# Patient Record
Sex: Female | Born: 1955 | Race: White | Hispanic: No | Marital: Married | Smoking: Former smoker
Health system: Southern US, Community
[De-identification: ages and names within clinical notes are randomized; demographics above are authoritative.]

## PROBLEM LIST (undated history)

## (undated) DIAGNOSIS — E119 Type 2 diabetes mellitus without complications: Secondary | ICD-10-CM

## (undated) DIAGNOSIS — Z940 Kidney transplant status: Secondary | ICD-10-CM

## (undated) DIAGNOSIS — I10 Essential (primary) hypertension: Secondary | ICD-10-CM

## (undated) DIAGNOSIS — N183 Chronic kidney disease, stage 3 unspecified: Secondary | ICD-10-CM

## (undated) DIAGNOSIS — D509 Iron deficiency anemia, unspecified: Secondary | ICD-10-CM

## (undated) DIAGNOSIS — C689 Malignant neoplasm of urinary organ, unspecified: Secondary | ICD-10-CM

## (undated) DIAGNOSIS — Z8551 Personal history of malignant neoplasm of bladder: Secondary | ICD-10-CM

## (undated) DIAGNOSIS — Z9889 Other specified postprocedural states: Secondary | ICD-10-CM

## (undated) DIAGNOSIS — M810 Age-related osteoporosis without current pathological fracture: Secondary | ICD-10-CM

## (undated) DIAGNOSIS — E785 Hyperlipidemia, unspecified: Secondary | ICD-10-CM

## (undated) HISTORY — DX: Hyperlipidemia, unspecified: E78.5

## (undated) HISTORY — PX: COLONOSCOPY: SHX174

## (undated) HISTORY — PX: OTHER SURGICAL HISTORY: SHX169

## (undated) HISTORY — DX: Essential (primary) hypertension: I10

## (undated) HISTORY — DX: Personal history of malignant neoplasm of bladder: Z85.51

## (undated) HISTORY — DX: Type 2 diabetes mellitus without complications: E11.9

## (undated) HISTORY — DX: Kidney transplant status: Z94.0

---

## 2020-02-11 LAB — COLOGUARD: COLOGUARD: NEGATIVE

## 2020-07-25 ENCOUNTER — Other Ambulatory Visit: Payer: Self-pay

## 2020-07-25 ENCOUNTER — Encounter: Payer: Self-pay | Admitting: Internal Medicine

## 2020-07-25 ENCOUNTER — Ambulatory Visit (INDEPENDENT_AMBULATORY_CARE_PROVIDER_SITE_OTHER): Payer: Medicare Other | Admitting: Internal Medicine

## 2020-07-25 VITALS — BP 138/80 | HR 68 | Wt 114.0 lb

## 2020-07-25 DIAGNOSIS — M810 Age-related osteoporosis without current pathological fracture: Secondary | ICD-10-CM

## 2020-07-25 DIAGNOSIS — E559 Vitamin D deficiency, unspecified: Secondary | ICD-10-CM

## 2020-07-25 LAB — BASIC METABOLIC PANEL
BUN: 42 mg/dL — ABNORMAL HIGH (ref 6–23)
CO2: 23 mEq/L (ref 19–32)
Calcium: 9 mg/dL (ref 8.4–10.5)
Chloride: 107 mEq/L (ref 96–112)
Creatinine, Ser: 1.25 mg/dL — ABNORMAL HIGH (ref 0.40–1.20)
GFR: 45.36 mL/min — ABNORMAL LOW (ref >60.00)
Glucose, Bld: 82 mg/dL (ref 70–99)
Potassium: 4.6 mEq/L (ref 3.5–5.1)
Sodium: 138 mEq/L (ref 135–145)

## 2020-07-25 NOTE — Patient Instructions (Addendum)
We will try to get your DXA records from [place].  Please call and schedule bone density scan at the [facility]: [phone].   We will start the Prolia preauthorization after results of the DXA scan are back.  Please stop at the lab.  Please return in 1 year.  How Can I Prevent Falls? Men and women with osteoporosis need to take care not to fall down. Falls can break bones. Some reasons people fall are: Poor vision  Poor balance  Certain diseases that affect how you walk  Some types of medicine, such as sleeping pills.  Some tips to help prevent falls outdoors are: Use a cane or walker  Wear rubber-soled shoes so you don't slip  Walk on grass when sidewalks are slippery  In winter, put salt or kitty litter on icy sidewalks.  Some ways to help prevent falls indoors are: Keep rooms free of clutter, especially on floors  Use plastic or carpet runners on slippery floors  Wear low-heeled shoes that provide good support  Do not walk in socks, stockings, or slippers  Be sure carpets and area rugs have skid-proof backs or are tacked to the floor  Be sure stairs are well lit and have rails on both sides  Put grab bars on bathroom walls near tub, shower, and toilet  Use a rubber bath mat in the shower or tub  Keep a flashlight next to your bed  Use a sturdy step stool with a handrail and wide steps  Add more lights in rooms (and night lights) Buy a cordless phone to keep with you so that you don't have to rush to the phone       when it rings and so that you can call for help if you fall.   (adapted from http://www.niams.NightlifePreviews.se)  Please check out the following book about best diet for bone health:   Exercise for Strong Bones (from [place]) There are two types of exercises that are important for building and maintaining bone density:  weight-bearing and muscle-strengthening exercises. Weight-bearing  Exercises These exercises include activities that make you move against gravity while staying upright. Weight-bearing exercises can be high-impact or low-impact. High-impact weight-bearing exercises help build bones and keep them strong. If you have broken a bone due to osteoporosis or are at risk of breaking a bone, you may need to avoid high-impact exercises. If you're not sure, you should check with your healthcare provider. Examples of high-impact weight-bearing exercises are: Dancing Doing high-impact aerobics Hiking Jogging/running Jumping Rope Stair climbing Tennis Low-impact weight-bearing exercises can also help keep bones strong and are a safe alternative if you cannot do high-impact exercises. Examples of low-impact weight-bearing exercises are: Using elliptical training machines Doing low-impact aerobics Using stair-step machines Fast walking on a treadmill or outside Muscle-Strengthening Exercises These exercises include activities where you move your body, a weight or some other resistance against gravity. They are also known as resistance exercises and include: Lifting weights Using elastic exercise bands Using weight machines Lifting your own body weight Functional movements, such as standing and rising up on your toes Yoga and Pilates can also improve strength, balance and flexibility. However, certain positions may not be safe for people with osteoporosis or those at increased risk of broken bones. For example, exercises that have you bend forward may increase the chance of breaking a bone in the spine. A physical therapist should be able to help you learn which exercises are safe and appropriate for you.  Non-Impact Exercises Non-impact exercises can help you to improve balance, posture and how well you move in everyday activities. These exercises can also help to increase muscle strength and decrease the risk of falls and broken bones. Some of these exercises  include: Balance exercises that strengthen your legs and test your balance, such as Tai Chi, can decrease your risk of falls. Posture exercises that improve your posture and reduce rounded or "sloping" shoulders can help you decrease the chance of breaking a bone, especially in the spine. Functional exercises that improve how well you move can help you with everyday activities and decrease your chance of falling and breaking a bone. For example, if you have trouble getting up from a chair or climbing stairs, you should do these activities as exercises. A physical therapist can teach you balance, posture and functional exercises. Starting a New Exercise Program If you haven't exercised regularly for a while, check with your healthcare provider before beginning a new exercise program--particularly if you have health problems such as heart disease, diabetes or high blood pressure. If you're at high risk of breaking a bone, you should work with a physical therapist to develop a safe exercise program. Once you have your healthcare provider's approval, start slowly. If you've already broken bones in the spine because of osteoporosis, be very careful to avoid activities that require reaching down, bending forward, rapid twisting motions, heavy lifting and those that increase your chance of a fall. As you get started, your muscles may feel sore for a day or two after you exercise. If soreness lasts longer, you may be working too hard and need to ease up. Exercises should be done in a pain-free range of motion. How Much Exercise Do You Need? Weight-bearing exercises 30 minutes on most days of the week. Do a 30-minutesession or multiple sessions spread out throughout the day. The benefits to your bones are the same.   Muscle-strengthening exercises Two to three days per week. If you don't have much time for strengthening/resistance training, do small amounts at a time. You can do just one body part each day. For  example do arms one day, legs the next and trunk the next. You can also spread these exercises out during your normal day.  Balance, posture and functional exercises Every day or as often as needed. You may want to focus on one area more than the others. If you have fallen or lose your balance, spend time doing balance exercises. If you are getting rounded shoulders, work more on posture exercises. If you have trouble climbing stairs or getting up from the couch, do more functional exercises. You can also perform these exercises at one time or spread them during your day. Work with a phyiscal therapist to learn the right exercises for you.   Denosumab: Patient drug information (Up-to-date) Copyright [phone] [place] rights reserved.  Brand Names: [place]  Prolia[place] See What is this drug used for?  It is used to treat soft, brittle bones (osteoporosis).  It is used for bone growth.  It is used when treating some cancers.  It may be given to you for other reasons. Talk with the doctor. What do I need to tell my doctor BEFORE I take this drug?  All products:  If you have an allergy to denosumab or any other part of this drug.  If you are allergic to any drugs like this one, any other drugs, foods, or other substances. Tell your doctor about the  allergy and what signs you had, like rash; hives; itching; shortness of breath; wheezing; cough; swelling of face, lips, tongue, or throat; or any other signs.  If you have low calcium levels.  ProliaT:  If you are pregnant or may be pregnant. Do not take this drug if you are pregnant.  This is not a list of all drugs or health problems that interact with this drug.  Tell your doctor and pharmacist about all of your drugs (prescription or OTC, natural products, vitamins) and health problems. You must check to make sure that it is safe for you to take this drug with all of your drugs and health problems. Do not start, stop, or change the dose of any  drug without checking with your doctor. What are some things I need to know or do while I take this drug?  All products:  Tell dentists, surgeons, and other doctors that you use this drug.  This drug may raise the chance of a broken leg. Talk with your doctor.  Have your blood work checked. Talk with your doctor.  Have a bone density test. Talk with your doctor.  Take calcium and vitamin D as you were told by your doctor.  Have a dental exam before starting this drug.  Take good care of your teeth. See a dentist often.  If you smoke, talk with your doctor.  Do not give to a child. Talk with your doctor.  Tell your doctor if you are breast-feeding. You will need to talk about any risks to your baby.  [place] See:  This drug may cause harm to the unborn baby if you take it while you are pregnant. If you get pregnant while taking this drug, call your doctor right away.  ProliaT:  Very bad infections have been reported with use of this drug. If you have any infection, are taking antibiotics now or in the recent past, or have many infections, talk with your doctor.  You may have more chance of getting an infection. Wash hands often. Stay away from people with infections, colds, or flu.  Use birth control that you can trust to prevent pregnancy while taking this drug.  If you are a man and your sex partner is pregnant or gets pregnant at any time while you are being treated, talk with your doctor. What are some side effects that I need to call my doctor about right away?  WARNING/CAUTION: Even though it may be rare, some people may have very bad and sometimes deadly side effects when taking a drug. Tell your doctor or get medical help right away if you have any of the following signs or symptoms that may be related to a very bad side effect:  All products:  Signs of an allergic reaction, like rash; hives; itching; red, swollen, blistered, or peeling skin with or without fever; wheezing; tightness in the  chest or throat; trouble breathing or talking; unusual hoarseness; or swelling of the mouth, face, lips, tongue, or throat.  Signs of low calcium levels like muscle cramps or spasms, numbness and tingling, or seizures.  Mouth sores.  Any new or strange groin, hip, or thigh pain.  This drug may cause jawbone problems. The chance may be higher the longer you take this drug. The chance may be higher if you have cancer, dental problems, dentures that do not fit well, anemia, blood clotting problems, or an infection. The chance may also be higher if you are having dental work or if you  are getting chemo, some steroid drugs, or radiation. Call your doctor right away if you have jaw swelling or pain.  Xgeva:  Not hungry.  Muscle pain or weakness.  Seizures.  Shortness of breath.  ProliaT:  Signs of infection. These include a fever of 100.55F (38C) or higher, chills, very bad sore throat, ear or sinus pain, cough, more sputum or change in color of sputum, pain with passing urine, mouth sores, wound that will not heal, or anal itching or pain.  Signs of a pancreas problem (pancreatitis) like very bad stomach pain, very bad back pain, or very bad upset stomach or throwing up.  Chest pain.  A heartbeat that does not feel normal.  Very bad skin irritation.  Feeling very tired or weak.  Bladder pain or pain when passing urine or change in how much urine is passed.  Passing urine often.  Swelling in the arms or legs. What are some other side effects of this drug?  All drugs may cause side effects. However, many people have no side effects or only have minor side effects. Call your doctor or get medical help if any of these side effects or any other side effects bother you or do not go away:  Xgeva:  Feeling tired or weak.  Headache.  Upset stomach or throwing up.  Loose stools (diarrhea).  Cough.  ProliaT:  Back pain.  Muscle or joint pain.  Sore throat.  Runny nose.  Pain in arms or legs.   These are not all of the side effects that may occur. If you have questions about side effects, call your doctor. Call your doctor for medical advice about side effects.  You may report side effects to your national health agency. How is this drug best taken?  Use this drug as ordered by your doctor. Read and follow the dosing on the label closely.  It is given as a shot into the fatty part of the skin. What do I do if I miss a dose?  Call the doctor to find out what to do. How do I store and/or throw out this drug?  This drug will be given to you in a hospital or doctor's office. You will not store it at home.  Keep all drugs out of the reach of children and pets.  Check with your pharmacist about how to throw out unused drugs.  General drug facts  If your symptoms or health problems do not get better or if they become worse, call your doctor.  Do not share your drugs with others and do not take anyone else's drugs.  Keep a list of all your drugs (prescription, natural products, vitamins, OTC) with you. Give this list to your doctor.  Talk with the doctor before starting any new drug, including prescription or OTC, natural products, or vitamins.  Some drugs may have another patient information leaflet. If you have any questions about this drug, please talk with your doctor, pharmacist, or other health care provider.  If you think there has been an overdose, call your poison control center or get medical care right away. Be ready to tell or show what was taken, how much, and when it happened.

## 2020-07-25 NOTE — Progress Notes (Addendum)
Patient ID: Rhonda [name], female   DOB: 1955-09-26, 65 y.o.   MRN: [id number]  This visit occurred during the SARS-CoV-2 public health emergency.  Safety protocols were in place, including screening questions prior to the visit, additional usage of staff PPE, and extensive cleaning of exam room while observing appropriate contact time as indicated for disinfecting solutions.   HPI  Rhonda [name] is a 65 y.o.-year-old female, referred by her PCP, Dr.Dr. Baird Cancer, for management of osteoporosis (OP).  She moved from [place] to [place] and then to [place] recently.  Patient is seen by Dr. Baird Cancer by nephrology for her history of reflux end-stage renal disease, status post renal transplant in 2001.  She is on prednisone 4 mg daily, Prograf, Tacrolimus.  Pt was dx with OP in 2019 at the time of her last bone density scan. Pt's DXA scans are not available, but will be requested today.  She denies fractures.  No falls.  No dizziness/vertigo/orthostasis/poor vision.   Previous OP treatments:  - Fosamax off and on for few years - after 2020 - per nephrologist in [place] ([place], [place]) - Cr increased to 1.5 >> stopped, but retrospectively, this could have been 2/2 Covid vaccine  She has a history of vitamin D insufficiency.  Reviewed available vit D levels: 12/18/2019: Vitamin D 27.9 - on MVI No results found for: VD25OH  Pt is on Centrum for women containing both calcium and vitamin D.  She is on a vegetarian diet.  Drinks almond milk.  She does some weight bearing exercises and also walks daily for more than 2 miles in the park near her [place].  Feels chest burning with walking uphill.  She does not take high vitamin A doses.  No high dose steroid use.  Menopause was at 43-51 y/o.   FH of osteoporosis: mother, possibly 2 sisters (kyphosis).  No h/o hyper/hypocalcemia or hyperparathyroidism. No h/o kidney stones. 12/18/2019: Calcium 9.1  (8.7-10.3) No results found for: PTH, CALCIUM  No h/o thyrotoxicosis. No TSH recent levels:  No results found for: TSH  She has CKD stage IIIa. Last BUN/Cr: 06/04/2020: Cr 1.42 12/18/2019: BUN/creatinine 41/1.38, GFR 40. No results found for: BUN, CREATININE  12/18/2019: Alkaline phosphatase was normal, at 75 (44-121).  She also has a history of bladder cancer - had intravesical chemotherapy, no radiation therapy; diverticulitis; HTN, HL.  ROS: + See HPI Constitutional: no weight gain, no weight loss, no fatigue, no subjective hyperthermia, no subjective hypothermia, + nocturia Eyes: no blurry vision, no xerophthalmia ENT: no sore throat, no nodules palpated in neck, no dysphagia, no odynophagia, no hoarseness, no tinnitus, no hypoacusis Cardiovascular: + shortness of breath, no palpitations, no leg swelling Respiratory: no cough, no wheezing, + shortness of breath Gastrointestinal: no N, no V, no D, no C, no acid reflux Musculoskeletal: no muscle, + joint aches Skin: no rash, no hair loss Neurological: no tremors, no numbness or tingling/no dizziness/no HAs Psychiatric: no depression, no anxiety  I reviewed pt's medications, allergies, PMH, social hx, family hx, and changes were documented in the history of present illness. Otherwise, unchanged from my initial visit note.  No past medical history on file.  Social History   Socioeconomic History   Marital status: Married    Spouse name: Not on file   Number of children: 1   Years of education: Not on file   Highest education level: Not on file  Occupational History   Not on file  Tobacco Use  Smoking status: Former    Pack years: 0.00    Types: Cigarettes    Quit date: 02/17/1983    Years since quitting: 37.4   Smokeless tobacco: Never  Substance and Sexual Activity   Alcohol use: Not on file   Drug use: Not on file   Sexual activity: Not on file  Other Topics Concern   Not on file  Social History Narrative   Not on  file   Social Determinants of Health   Financial Resource Strain: Not on file  Food Insecurity: Not on file  Transportation Needs: Not on file  Physical Activity: Not on file  Stress: Not on file  Social Connections: Not on file  Intimate Partner Violence: Not on file   Current Outpatient Medications on File Prior to Visit  Medication Sig Dispense Refill   lisinopril (ZESTRIL) 5 MG tablet TAKE 1 TABLET(5 MG) BY MOUTH DAILY     lovastatin (MEVACOR) 20 MG tablet TAKE 1 TABLET(20 MG) BY MOUTH AT BEDTIME     predniSONE (DELTASONE) 1 MG tablet TAKE 4 TABLETS(4 MG) BY MOUTH DAILY     tacrolimus (PROGRAF) 1 MG capsule TAKE 3 CAPSULES BY MOUTH EVERY 12 HOURS     No current facility-administered medications on file prior to visit.   Allergies  Allergen Reactions   Penicillins Rash   Sulfa Antibiotics Rash   Family History  Problem Relation Age of Onset   Heart disease Mother    Cancer Father    Diabetes Father    Heart disease Sister     PE: BP 138/80 (BP Location: Right Arm, Patient Position: Sitting, Cuff Size: Normal)   Pulse 68   Wt 114 lb (51.7 kg)   SpO2 98%  Wt Readings from Last 3 Encounters:  07/25/20 114 lb (51.7 kg)   Constitutional: Thin, in NAD. No kyphosis. Eyes: PERRLA, EOMI, no exophthalmos ENT: moist mucous membranes, no thyromegaly, no cervical lymphadenopathy Cardiovascular: RRR, No MRG Respiratory: CTA B Gastrointestinal: abdomen soft, NT, ND, BS+ Musculoskeletal: no deformities, strength intact in all 4 Skin: moist, warm, no rashes Neurological: no tremor with outstretched hands, DTR normal in all 4  Assessment: 1. Osteoporosis  2.  Vitamin D insufficiency  Plan: 1. Osteoporosis - likely postmenopausal/age-related + also posttransplant + genetic - she has FH of OP - Discussed about increased risk of fracture, depending on the T score, greatly increased when the T score is lower than -2.5, but it is actually a continuum and -2.5 should not be  regarded as an absolute threshold.  With T-scores in the osteoporotic range reportedly, she has an increased risk for fractures.  At today's visit, she signed a release of information form and we will send this to Dr. Cheri Kearns, her previous endocrinologist, who has patient's DXA scan report.  Since it has been 3 years from the previous scan, we will obtain a new bone density now. - we reviewed her dietary and supplemental calcium and vitamin D intake. I recommended to make sure she gets 1000-1200 mg of calcium daily preferentially from diet and I will check vit D today to see if she needs further supplementation beyond the multivitamin. - discussed fall precautions   - given handout from [facility]: weight bearing exercises - advised to do this every day or at least 5/7 days - I also recommended the [facility] - for skeletal loading. Given brochure and explained benefits. - we discussed about maintaining a good amount of protein in her  diet. The recommended daily protein intake is ~0.8 g per kilogram per day for her, approximately 40 g). Also, avoid smoking or >2 drinks of alcohol a day. - I recommended the following book for the concept of low acid eating and explained why this is important:  - We discussed about the different medication classes, benefits and side effects (including atypical fractures and ONJ - no dental workup in progress or planned).  - I explained that first options are sq denosumab (Prolia) sq every 6 months since this is not eliminated renally, and we can continue this for as much as 10 years and probably 1 further.  Due to her chronic kidney disease and previous increase in creatinine after Fosamax bisphosphonates are not first-line for her.  If absolutely necessary, we can try zoledronic acid (Reclast) iv - but with slow infusion rate, 1x a year for 1-2 years. I would use Teriparatide/Abaloparatide (which are daily subcu medication) or  Romosozumab (monthly) as a last resort.  - Pt was given reading information about Prolia, and I explained the mechanism of action and expected benefits.  - Will check the following: Orders Placed This Encounter  Procedures   DG Bone Density   VITAMIN D 25 Hydroxy (Vit-D Deficiency, Fractures)   Basic metabolic panel  - if labs normal, will arrange for a Prolia inj - will check a new DXA scan in 2 years after starting Prolia -  I explained that the first indication that the treatment is working is her not having anymore fractures. DXA scan changes are secondary: unchanged or slightly higher T-scores are desirable - will see pt back in a year  2.  Vitamin D insufficiency -on a MVI -We will check a vitamin D level today -Discussed that we may need to add back a separate vitamin D supplement  After the lab and DXA results are back, we need to send a preauthorization for Prolia to her insurance.  Component     Latest Ref Rng & Units 07/25/2020  Sodium     135 - 145 mEq/L 138  Potassium     3.5 - 5.1 mEq/L 4.6  Chloride     96 - 112 mEq/L 107  CO2     19 - 32 mEq/L 23  Glucose     70 - 99 mg/dL 82  BUN     6 - 23 mg/dL 42 (H)  Creatinine     0.40 - 1.20 mg/dL 1.25 (H)  GFR     >60.00 mL/min 45.36 (L)  Calcium     8.4 - 10.5 mg/dL 9.0  Vitamin D, 25-Hydroxy     30.0 - 100.0 ng/mL 23.5 (L)  Vitamin D is low.  I would suggest to start 2000 units vitamin D daily and we will recheck her level in 2-3 months.   07/30/2020 ([facility]) Lumbar spine L1-L4 Femoral neck (FN)  T-score   -1.6 RFN: -3.5 LFN: -3.2   Osteoporosis  - will start the application for Prolia.  Philemon Kingdom, MD PhD [facility]

## 2020-07-26 ENCOUNTER — Encounter: Payer: Self-pay | Admitting: Internal Medicine

## 2020-07-26 ENCOUNTER — Ambulatory Visit: Payer: Medicare Other | Admitting: Interventional Cardiology

## 2020-07-26 ENCOUNTER — Telehealth: Payer: Self-pay

## 2020-07-26 LAB — VITAMIN D 25 HYDROXY (VIT D DEFICIENCY, FRACTURES): Vit D, 25-Hydroxy: 23.5 ng/mL — ABNORMAL LOW (ref 30.0–100.0)

## 2020-07-26 NOTE — Telephone Encounter (Addendum)
Requested pt call back and schedule lab appt. ----- Message from Philemon Kingdom, MD sent at 07/26/2020  8:17 AM EDT ----- Can you please call pt.:  Vitamin D is low.  I would suggest to start 2000 units vitamin D3 daily (OTC) and we will recheck her level in 2-3 months. Lab is in.

## 2020-07-30 ENCOUNTER — Ambulatory Visit (INDEPENDENT_AMBULATORY_CARE_PROVIDER_SITE_OTHER)
Admission: RE | Admit: 2020-07-30 | Discharge: 2020-07-30 | Disposition: A | Discharge status: Home / Self Care | Payer: Medicare Other | Source: Ambulatory Visit | Attending: Internal Medicine | Admitting: Internal Medicine

## 2020-07-30 ENCOUNTER — Other Ambulatory Visit: Payer: Self-pay

## 2020-07-30 DIAGNOSIS — M810 Age-related osteoporosis without current pathological fracture: Secondary | ICD-10-CM | POA: Diagnosis not present

## 2020-08-02 DIAGNOSIS — M810 Age-related osteoporosis without current pathological fracture: Secondary | ICD-10-CM | POA: Diagnosis not present

## 2020-08-05 ENCOUNTER — Telehealth: Payer: Self-pay

## 2020-08-05 NOTE — Telephone Encounter (Signed)
Prolia VOB initiated via [email or web address] 

## 2020-08-06 ENCOUNTER — Telehealth: Payer: Self-pay

## 2020-08-06 NOTE — Telephone Encounter (Addendum)
-----   Message from Philemon Kingdom, MD sent at 08/05/2020  7:00 PM EDT ----- Can you please call pt.:  I received the result of her recent DXA scan: this shows osteoporosis, with the lowest T-score at the hips. We will start the process to obtain Prolia - will submit the application to the insurance and will contact her as soon as it is approved. Just in case, please let us know if you are not contacted inthe next 2 weeks. Ty! C  Left VM requesting a call back to discuss results.

## 2020-08-07 NOTE — Telephone Encounter (Signed)
Pt calling in to follow up on last message.

## 2020-08-08 NOTE — Telephone Encounter (Signed)
Pt ready for scheduling on or after 08/08/20  Out-of-pocket cost due at time of visit: $255  Primary: [employer] Medicare Prolia co-insurance: 20% ($255) Admin fee co-insurance: 0%  Secondary: n/a Prolia co-insurance:  Admin fee co-insurance:   Deductible: does not apply  Prior Auth: not required PA# Valid:

## 2020-08-09 ENCOUNTER — Encounter: Payer: Self-pay | Admitting: Internal Medicine

## 2020-08-10 ENCOUNTER — Telehealth: Payer: Self-pay

## 2020-08-10 NOTE — Progress Notes (Signed)
Spoke with pt regarding lab results. 

## 2020-08-16 NOTE — Telephone Encounter (Signed)
Called to schedule - patient asked if she could call back to schedule.

## 2020-08-27 ENCOUNTER — Encounter: Payer: Self-pay | Admitting: Internal Medicine

## 2020-08-28 ENCOUNTER — Other Ambulatory Visit: Payer: Self-pay | Admitting: Internal Medicine

## 2020-08-28 MED ORDER — ALENDRONATE SODIUM 70 MG PO TABS: 70.0000 mg | ORAL_TABLET | ORAL | 3 refills | Status: DC

## 2020-08-29 NOTE — Telephone Encounter (Signed)
Pt overdue for Prolia injection. Please follow up with pt to schedule.   Thank you!

## 2020-09-18 NOTE — Telephone Encounter (Signed)
Called and spoke with patient she has decided to stay with Fosamax.  She did not want to schedule Prolia injection

## 2020-09-19 NOTE — Telephone Encounter (Signed)
Pt. Discontinued in Prolia Portal.

## 2020-09-23 NOTE — Progress Notes (Signed)
Cardiology Office Note:    Date:  09/25/2020   ID:  Rhonda [name], DOB Mar 08, 1955, MRN [id number]  PCP:  Jolinda Croak, MD  Cardiologist:  None   Referring MD: Rexene Agent, MD   Chief Complaint  Patient presents with   Shortness of Breath     History of Present Illness:    Rhonda [name] is a 65 y.o. female with a hx of exertional dyspnea.  Background history includes hypertension, end-stage kidney disease requiring kidney transplantation 2001, prediabetes, and hyperlipidemia.  Noticing exertional dyspnea when walking up inclines.  This is relatively new.  She walks 45 minutes 3-4 times per week.  She feels this complaint is getting somewhat better since she spoke to Dr. Joelyn Oms about it in June.  She denies associated chest discomfort.  There is no orthopnea or PND.  He does not smoke.    Past Medical History:  Diagnosis Date   Diabetes mellitus without complication ([place])    Hyperlipidemia    Hypertension    Kidney transplant recipient     Past Surgical History:  Procedure Laterality Date   Kidney transplant 2001 N/A     Current Medications: Current Meds  Medication Sig   alendronate (FOSAMAX) 70 MG tablet Take 1 tablet (70 mg total) by mouth every 7 (seven) days. Take with a full glass of water on an empty stomach.   b complex vitamins capsule Take 1 capsule by mouth daily.   docusate sodium (COLACE) 100 MG capsule Take 100 mg by mouth daily.   lisinopril (ZESTRIL) 5 MG tablet TAKE 1 TABLET(5 MG) BY MOUTH DAILY   lovastatin (MEVACOR) 20 MG tablet TAKE 1 TABLET(20 MG) BY MOUTH AT BEDTIME   predniSONE (DELTASONE) 1 MG tablet TAKE 4 TABLETS(4 MG) BY MOUTH DAILY   tacrolimus (PROGRAF) 1 MG capsule TAKE 3 CAPSULES BY MOUTH EVERY 12 HOURS     Allergies:   Penicillins and Sulfa antibiotics   Social History   Socioeconomic History   Marital status: Married    Spouse name: Not on file   Number of children: 1   Years of education: Not on file    Highest education level: Not on file  Occupational History   Not on file  Tobacco Use   Smoking status: Former    Types: Cigarettes    Quit date: 02/17/1983    Years since quitting: 37.6   Smokeless tobacco: Never  Substance and Sexual Activity   Alcohol use: Not on file   Drug use: Not on file   Sexual activity: Not on file  Other Topics Concern   Not on file  Social History Narrative   Not on file   Social Determinants of Health   Financial Resource Strain: Not on file  Food Insecurity: Not on file  Transportation Needs: Not on file  Physical Activity: Not on file  Stress: Not on file  Social Connections: Not on file     Family History: The patient's family history includes Cancer in her father; Diabetes in her father; Heart disease in her mother and sister.  ROS:   Please see the history of present illness.    No difficulty sleeping.  Sister had PAD.  She has no claudication.  Never had a stroke.  All other systems reviewed and are negative.  EKGs/Labs/Other Studies Reviewed:    The following studies were reviewed today: No functional testing  EKG:  EKG sinus bradycardia, normal PR interval, overall normal appearance.  Recent Labs:  07/25/2020: BUN 42; Creatinine, Ser 1.25; Potassium 4.6; Sodium 138  Recent Lipid Panel No results found for: CHOL, TRIG, HDL, CHOLHDL, VLDL, LDLCALC, LDLDIRECT  Physical Exam:    VS:  BP 102/68   Pulse (!) 58   Ht '5\' 3"'$  (1.6 m)   Wt 111 lb 3.2 oz (50.4 kg)   SpO2 98%   BMI 19.70 kg/m     Wt Readings from Last 3 Encounters:  09/25/20 111 lb 3.2 oz (50.4 kg)  07/25/20 114 lb (51.7 kg)     GEN: Slender was somewhat pale skin. No acute distress HEENT: Normal NECK: No JVD. LYMPHATICS: No lymphadenopathy CARDIAC: No murmur. RRR no gallop, or edema. VASCULAR:  Normal Pulses. No bruits. RESPIRATORY:  Clear to auscultation without rales, wheezing or rhonchi  ABDOMEN: Soft, non-tender, non-distended, No pulsatile  mass, MUSCULOSKELETAL: No deformity  SKIN: Warm and dry NEUROLOGIC:  Alert and oriented x 3 PSYCHIATRIC:  Normal affect   ASSESSMENT:    1. DOE (dyspnea on exertion)   2. Hyperlipidemia LDL goal <70   3. End stage kidney disease ([place])   4. Kidney transplanted   5. Primary hypertension   6. Prediabetes    PLAN:    In order of problems listed above:  Possibly deconditioning.  Rule out diastolic dysfunction.  Rule out coronary disease.  A stress Myoview study will be done to exclude ischemic equivalent given her increased risk of vascular complications related to end-stage kidney disease, hyperlipidemia, and prediabetes. Most recent LDL 111.  If possible I would recommend further intensifying statin therapy to get LDL less than 70. Being followed closely and still has functioning pediatric kidney transplant from [id number] without complications.  Systolic Systolic blood pressure is slightly higher than we would like.  She will have a stress test done and we can gauge blood pressure response to exercise and determine if that has anything to do with exertional dyspnea. We discussed prediabetes and the risk imposed for possible vascular events.    After discussion, we have decided to do a myocardial perfusion study with exercise related stress to establish a baseline for follow-up.  If there is no evidence of ischemia/low risk, I would recommend aggressive primary risk factor modification to avoid vascular events downstream.   Overall education and awareness concerning primary/secondary risk prevention was discussed in detail: LDL less than 70, hemoglobin A1c less than 7, blood pressure target less than 130/80 mmHg, >150 minutes of moderate aerobic activity per week, avoidance of smoking, weight control (via diet and exercise), and continued surveillance/management of/for obstructive sleep apnea.    Medication Adjustments/Labs and Tests Ordered: Current medicines are reviewed at length with  the patient today.  Concerns regarding medicines are outlined above.  Orders Placed This Encounter  Procedures   MYOCARDIAL PERFUSION IMAGING   EKG 12-Lead    No orders of the defined types were placed in this encounter.   Patient Instructions  Medication Instructions:  Your physician recommends that you continue on your current medications as directed. Please refer to the Current Medication list given to you today.  *If you need a refill on your cardiac medications before your next appointment, please call your pharmacy*   Lab Work: None If you have labs (blood work) drawn today and your tests are completely normal, you will receive your results only by: [place] (if you have MyChart) OR A paper copy in the mail If you have any lab test that is abnormal or we need to change your treatment,  we will call you to review the results.   Testing/Procedures: Your physician has requested that you have an exercise stress myoview. For further information please visit [email or web address]. Please follow instruction sheet, as given.   Follow-Up: At [facility], you and your health needs are our priority.  As part of our continuing mission to provide you with exceptional heart care, we have created designated Provider Care Teams.  These Care Teams include your primary Cardiologist (physician) and Advanced Practice Providers (APPs -  Physician Assistants and Nurse Practitioners) who all work together to provide you with the care you need, when you need it.  We recommend signing up for the patient portal called "MyChart".  Sign up information is provided on this After Visit Summary.  MyChart is used to connect with patients for Virtual Visits (Telemedicine).  Patients are able to view lab/test results, encounter notes, upcoming appointments, etc.  Non-urgent messages can be sent to your provider as well.   To learn more about what you can do with MyChart, go to [email or web address].     Your next appointment:   As needed  The format for your next appointment:   In Person  Provider:   You may see Daneen Schick, MD or one of the following Advanced Practice Providers on your designated Care Team:   Cecilie Kicks, NP    Other Instructions     Signed, Sinclair Grooms, MD  09/25/2020 9:59 AM    [place]

## 2020-09-25 ENCOUNTER — Other Ambulatory Visit: Payer: Self-pay

## 2020-09-25 ENCOUNTER — Encounter: Payer: Self-pay | Admitting: *Deleted

## 2020-09-25 ENCOUNTER — Ambulatory Visit: Payer: Medicare Other | Admitting: Interventional Cardiology

## 2020-09-25 ENCOUNTER — Encounter: Payer: Self-pay | Admitting: Interventional Cardiology

## 2020-09-25 VITALS — BP 102/68 | HR 58 | Ht 63.0 in | Wt 111.2 lb

## 2020-09-25 DIAGNOSIS — R0600 Dyspnea, unspecified: Secondary | ICD-10-CM | POA: Diagnosis not present

## 2020-09-25 DIAGNOSIS — E785 Hyperlipidemia, unspecified: Secondary | ICD-10-CM

## 2020-09-25 DIAGNOSIS — I10 Essential (primary) hypertension: Secondary | ICD-10-CM

## 2020-09-25 DIAGNOSIS — R7303 Prediabetes: Secondary | ICD-10-CM

## 2020-09-25 DIAGNOSIS — N186 End stage renal disease: Secondary | ICD-10-CM | POA: Diagnosis not present

## 2020-09-25 DIAGNOSIS — Z940 Kidney transplant status: Secondary | ICD-10-CM

## 2020-09-25 DIAGNOSIS — R0609 Other forms of dyspnea: Secondary | ICD-10-CM

## 2020-09-25 NOTE — Patient Instructions (Signed)
Medication Instructions:  Your physician recommends that you continue on your current medications as directed. Please refer to the Current Medication list given to you today.  *If you need a refill on your cardiac medications before your next appointment, please call your pharmacy*   Lab Work: None If you have labs (blood work) drawn today and your tests are completely normal, you will receive your results only by: [place] (if you have MyChart) OR A paper copy in the mail If you have any lab test that is abnormal or we need to change your treatment, we will call you to review the results.   Testing/Procedures: Your physician has requested that you have an exercise stress myoview. For further information please visit [email or web address]. Please follow instruction sheet, as given.   Follow-Up: At [facility], you and your health needs are our priority.  As part of our continuing mission to provide you with exceptional heart care, we have created designated Provider Care Teams.  These Care Teams include your primary Cardiologist (physician) and Advanced Practice Providers (APPs -  Physician Assistants and Nurse Practitioners) who all work together to provide you with the care you need, when you need it.  We recommend signing up for the patient portal called "MyChart".  Sign up information is provided on this After Visit Summary.  MyChart is used to connect with patients for Virtual Visits (Telemedicine).  Patients are able to view lab/test results, encounter notes, upcoming appointments, etc.  Non-urgent messages can be sent to your provider as well.   To learn more about what you can do with MyChart, go to [email or web address].    Your next appointment:   As needed  The format for your next appointment:   In Person  Provider:   You may see Daneen Schick, MD or one of the following Advanced Practice Providers on your designated Care Team:   Cecilie Kicks, NP    Other  Instructions

## 2020-09-27 ENCOUNTER — Telehealth (HOSPITAL_COMMUNITY): Payer: Self-pay | Admitting: *Deleted

## 2020-09-27 NOTE — Telephone Encounter (Signed)
Patient given detailed instructions per Myocardial Perfusion Study Information Sheet for the test on 10/04/20 at 7:45. Patient notified to arrive 15 minutes early and that it is imperative to arrive on time for appointment to keep from having the test rescheduled.  If you need to cancel or reschedule your appointment, please call the office within 24 hours of your appointment. . Patient verbalized understanding.Rhonda [name]

## 2020-10-04 ENCOUNTER — Other Ambulatory Visit: Payer: Self-pay

## 2020-10-04 ENCOUNTER — Ambulatory Visit (HOSPITAL_COMMUNITY): Payer: Medicare Other | Attending: Cardiovascular Disease

## 2020-10-04 VITALS — Ht 63.0 in | Wt 111.0 lb

## 2020-10-04 DIAGNOSIS — R110 Nausea: Secondary | ICD-10-CM | POA: Diagnosis present

## 2020-10-04 DIAGNOSIS — R0600 Dyspnea, unspecified: Secondary | ICD-10-CM | POA: Diagnosis not present

## 2020-10-04 DIAGNOSIS — R0609 Other forms of dyspnea: Secondary | ICD-10-CM

## 2020-10-04 LAB — MYOCARDIAL PERFUSION IMAGING
LV dias vol: 44 mL (ref 46–106)
LV sys vol: 10 mL
Peak HR: 126 {beats}/min
Rest HR: 67 {beats}/min
SDS: 6
SRS: 1
SSS: 7
TID: 0.97

## 2020-10-04 MED ORDER — REGADENOSON 0.4 MG/5ML IV SOLN
0.4000 mg | Freq: Once | INTRAVENOUS | Status: AC
  Administered 2020-10-04: 0.4 mg via INTRAVENOUS

## 2020-10-04 MED ORDER — AMINOPHYLLINE 25 MG/ML IV SOLN
75.0000 mg | Freq: Once | INTRAVENOUS | Status: AC
  Administered 2020-10-04: 75 mg via INTRAVENOUS

## 2020-10-04 MED ORDER — TECHNETIUM TC 99M TETROFOSMIN IV KIT
10.2000 | PACK | Freq: Once | INTRAVENOUS | Status: AC | PRN
  Administered 2020-10-04: 10.2 via INTRAVENOUS
  Filled 2020-10-04: qty 11

## 2020-10-04 MED ORDER — TECHNETIUM TC 99M TETROFOSMIN IV KIT
30.7000 | PACK | Freq: Once | INTRAVENOUS | Status: AC | PRN
  Administered 2020-10-04: 30.7 via INTRAVENOUS
  Filled 2020-10-04: qty 31

## 2020-10-11 ENCOUNTER — Other Ambulatory Visit: Payer: Medicare Other

## 2020-10-11 ENCOUNTER — Other Ambulatory Visit (INDEPENDENT_AMBULATORY_CARE_PROVIDER_SITE_OTHER): Payer: Medicare Other

## 2020-10-11 ENCOUNTER — Other Ambulatory Visit: Payer: Self-pay

## 2020-10-11 DIAGNOSIS — E559 Vitamin D deficiency, unspecified: Secondary | ICD-10-CM

## 2020-10-11 DIAGNOSIS — M810 Age-related osteoporosis without current pathological fracture: Secondary | ICD-10-CM | POA: Diagnosis not present

## 2020-10-12 LAB — VITAMIN D 25 HYDROXY (VIT D DEFICIENCY, FRACTURES): Vit D, 25-Hydroxy: 43.7 ng/mL (ref 30.0–100.0)

## 2020-11-13 ENCOUNTER — Ambulatory Visit: Payer: Medicare Other | Admitting: Internal Medicine

## 2020-11-22 ENCOUNTER — Telehealth: Payer: Self-pay | Admitting: *Deleted

## 2020-11-22 ENCOUNTER — Other Ambulatory Visit: Payer: Self-pay | Admitting: *Deleted

## 2020-11-22 ENCOUNTER — Encounter: Payer: Self-pay | Admitting: *Deleted

## 2020-11-22 DIAGNOSIS — D649 Anemia, unspecified: Secondary | ICD-10-CM

## 2020-11-22 NOTE — Progress Notes (Signed)
Lab orders entered, on 11/1 and to see Dr Benay Spice on 11/3 at 1:40 pm

## 2020-11-22 NOTE — Progress Notes (Signed)
Patient scheduled to see Dr Benay Spice on 11/3 at 1:40 pm with labs on 11/1 Orders entered and scheduling message sent

## 2020-12-10 ENCOUNTER — Encounter: Payer: Self-pay | Admitting: Emergency Medicine

## 2020-12-10 ENCOUNTER — Other Ambulatory Visit: Payer: Self-pay

## 2020-12-10 ENCOUNTER — Telehealth: Payer: Self-pay | Admitting: Internal Medicine

## 2020-12-10 ENCOUNTER — Ambulatory Visit (INDEPENDENT_AMBULATORY_CARE_PROVIDER_SITE_OTHER): Payer: Medicare Other | Admitting: Emergency Medicine

## 2020-12-10 VITALS — BP 120/80 | HR 68 | Ht 63.0 in | Wt 112.0 lb

## 2020-12-10 DIAGNOSIS — Z8639 Personal history of other endocrine, nutritional and metabolic disease: Secondary | ICD-10-CM | POA: Diagnosis not present

## 2020-12-10 DIAGNOSIS — Z8679 Personal history of other diseases of the circulatory system: Secondary | ICD-10-CM

## 2020-12-10 DIAGNOSIS — Z8739 Personal history of other diseases of the musculoskeletal system and connective tissue: Secondary | ICD-10-CM

## 2020-12-10 DIAGNOSIS — K625 Hemorrhage of anus and rectum: Secondary | ICD-10-CM

## 2020-12-10 DIAGNOSIS — Z8551 Personal history of malignant neoplasm of bladder: Secondary | ICD-10-CM

## 2020-12-10 DIAGNOSIS — Z940 Kidney transplant status: Secondary | ICD-10-CM

## 2020-12-10 NOTE — Patient Instructions (Signed)
Lower Gastrointestinal Bleeding Lower gastrointestinal (GI) bleeding is the result of bleeding from the colon, the rectum, or the area of the opening of the buttocks (anal area). The colon is the last part of the digestive tract, where stool (feces) is formed. A person with lower GI bleeding may see blood in or on his or her stool. The blood may be bright red. Lower GI bleeding often stops without treatment. Continued or heavy bleeding may be life-threatening and needs emergency treatment at a hospital. What are the causes? This condition may be caused by: Diverticulosis. This condition causes pouches to form in your colon over time. Diverticulitis. This is inflammation in areas where diverticulosis has occurred. Inflammatory bowel disease (IBD), or inflammation of the colon. Hemorrhoids, or swollen veins in the rectum. Anal fissures, or painful tears in the anus. Tears are often caused by passing hard stools. Cancer of the colon or rectum, or polyps of the colon or rectum. Polyps are noncancerous growths. Coagulopathy. This is a disorder that makes it hard to form blood clots and causes easy bleeding. Arteriovenous malformation. This is an abnormal weakening of a blood vessel where an artery and a vein come together. What increases the risk? The following factors may make you more likely to develop this condition: Being older than age 66. Regular use of aspirin or NSAIDs, such as ibuprofen. Taking blood thinners (anticoagulants) or anti-platelet medicines. Having a history of high-dose X-ray treatment (radiation therapy) of the colon. Having recently had a colon polyp removed. Heavy use of alcohol, or use of nicotine products. What are the signs or symptoms? Symptoms of this condition include: Bright red blood or blood clots coming from your rectum. Bloody stools. Black or maroon-colored stools. Pain or cramping in your abdomen. Weakness or dizziness. Racing heartbeat. How is this  diagnosed? This condition may be diagnosed based on: Your symptoms and medical history. A physical exam. During the exam, your health care provider will check for signs of blood loss, such as low blood pressure and a fast pulse. Tests or procedures. These may include: Flexible sigmoidoscopy. In this procedure, a flexible tube with a camera on the end is used to examine your anus and the first part of your colon to look for the source of bleeding. Colonoscopy. This is similar to a flexible sigmoidoscopy, but the camera can extend all the way to the uppermost part of your colon. Blood tests to measure your red blood cell count and to check for coagulopathy. Angiogram. For this test, X-rays of your colon are taken after a dye or radioactive substance is injected into your bloodstream. This may be done to look for a bleeding site. How is this treated? Treatment for this condition depends on the cause of your bleeding. Heavy or ongoing bleeding is treated at a hospital. Treatment may include: Getting fluids through an IV inserted into one of your veins. Getting blood through an IV (blood transfusion). Stopping bleeding with high heat (coagulation), injections of certain medicines, or surgical clips. This can all be done during a colonoscopy. Having a procedure that involves first doing an angiogram and then blocking blood flow to the bleeding site (embolization). Stopping some of your regular medicines for a certain amount of time. Having surgery to remove part of your colon. This may be needed if bleeding is severe and does not lessen after other treatment. Follow these instructions at home: Eating and drinking  Eat foods that are high in fiber. This will help keep your stools soft.  These foods include whole grains, legumes, fruits, and vegetables. Eating 1-3 prunes a day works well for many people. Drink enough fluid to keep your urine pale yellow. Do not drink alcohol. General instructions  Do  not use any products that contain nicotine or tobacco, such as cigarettes, e-cigarettes, and chewing tobacco. If you need help quitting, ask your health care provider. Take over-the-counter and prescription medicines only as told by your health care provider. You may need to avoid aspirin, NSAIDs, or other medicines that increase bleeding. Return to your normal activities as told by your health care provider. Ask your health care provider what activities are safe for you. Keep all follow-up visits as told by your health care provider. This is important. Contact a health care provider if: Your symptoms do not improve. You feel nauseous, or you vomit. You have pain in your abdomen. You feel weak or dizzy. You need help to stop smoking or drinking alcohol. Get help right away if: Your bleeding increases. You have severe weakness or you faint. You have sudden or severe cramps in your back or abdomen. You vomit blood. You pass large blood clots in your stool. Any of your other symptoms get worse. These symptoms may represent a serious problem that is an emergency. Do not wait to see if the symptoms will go away. Get medical help right away. Call your local emergency services (911 in the [place] Do not drive yourself to the hospital. Summary If you have lower gastrointestinal (GI) bleeding, you may see blood in or on your stool (feces). The blood may be bright red. Take over-the-counter and prescription medicines only as told by your health care provider. You may need to avoid aspirin, NSAIDs, or other medicines that increase bleeding. Keep all follow-up visits as told by your health care provider. This is important. Get help right away if your symptoms get worse. This information is not intended to replace advice given to you by your health care provider. Make sure you discuss any questions you have with your health care provider. Document Revised: 01/28/2019 Document Reviewed: 01/28/2019 Elsevier  Patient Education  2022 [employer].

## 2020-12-10 NOTE — Progress Notes (Signed)
Rhonda [name] 65 y.o.   Chief Complaint  Patient presents with   Blood In Stools    Pt states it comes on and off    HISTORY OF PRESENT ILLNESS: This is a 65 y.o. female first visit to this office with an acute problem. Complaining of intermittent blood in the stools for the past several weeks. Denies abdominal pain.  Denies nausea or vomiting.  Denies syncope or feeling lightheaded. No associated symptoms. Patient has history of diabetes, hypertension, and dyslipidemia.  Kidney transplant patient.  History of bladder cancer. Sees hematologist oncologist on a regular basis as well as kidney doctor. States that she gets blood work regularly.  Does not want blood work done. States she also has history of diverticulosis and IBS.  Last colonoscopy done about 20 years ago. Cologuard test negative in 2021. Also has history of osteoporosis.  Sees endocrinologist on a regular basis.   HPI   Prior to Admission medications   Medication Sig Start Date End Date Taking? Authorizing Provider  alendronate (FOSAMAX) 70 MG tablet Take 1 tablet (70 mg total) by mouth every 7 (seven) days. Take with a full glass of water on an empty stomach. 08/28/20  Yes Philemon Kingdom, MD  b complex vitamins capsule Take 1 capsule by mouth daily.   Yes [provider]  docusate sodium (COLACE) 100 MG capsule Take 100 mg by mouth daily. 07/12/20  Yes [provider]  lisinopril (ZESTRIL) 5 MG tablet TAKE 1 TABLET(5 MG) BY MOUTH DAILY 06/03/20  Yes [provider]  lovastatin (MEVACOR) 20 MG tablet TAKE 1 TABLET(20 MG) BY MOUTH AT BEDTIME 06/03/20  Yes [provider]  predniSONE (DELTASONE) 1 MG tablet TAKE 4 TABLETS(4 MG) BY MOUTH DAILY 06/03/20  Yes [provider]  tacrolimus (PROGRAF) 1 MG capsule TAKE 3 CAPSULES BY MOUTH EVERY 12 HOURS 03/11/20  Yes [provider]    Allergies  Allergen Reactions   Penicillins Rash   Sulfa Antibiotics Rash     There are no problems to display for this patient.   Past Medical History:  Diagnosis Date   Diabetes mellitus without complication ([place])    Hyperlipidemia    Hypertension    Kidney transplant recipient     Past Surgical History:  Procedure Laterality Date   Kidney transplant 2001 N/A     Social History   Socioeconomic History   Marital status: Married    Spouse name: Not on file   Number of children: 1   Years of education: Not on file   Highest education level: Not on file  Occupational History   Not on file  Tobacco Use   Smoking status: Former    Types: Cigarettes    Quit date: 02/17/1983    Years since quitting: 37.8   Smokeless tobacco: Never  Substance and Sexual Activity   Alcohol use: Not on file   Drug use: Not on file   Sexual activity: Not on file  Other Topics Concern   Not on file  Social History Narrative   Not on file   Social Determinants of Health   Financial Resource Strain: Not on file  Food Insecurity: Not on file  Transportation Needs: Not on file  Physical Activity: Not on file  Stress: Not on file  Social Connections: Not on file  Intimate Partner Violence: Not on file    Family History  Problem Relation Age of Onset   Heart disease Mother    Cancer Father  Diabetes Father    Heart disease Sister      Review of Systems  Constitutional: Negative.  Negative for chills and fever.  HENT: Negative.  Negative for congestion and sore throat.   Respiratory: Negative.  Negative for cough and shortness of breath.   Cardiovascular: Negative.  Negative for chest pain and palpitations.  Gastrointestinal:  Positive for blood in stool. Negative for abdominal pain, diarrhea, melena, nausea and vomiting.  Genitourinary: Negative.   Skin: Negative.  Negative for rash.  Neurological: Negative.  Negative for dizziness and headaches.  All other systems reviewed and are negative.  Today's Vitals   12/10/20 1112  BP: 120/80  Pulse: 68   SpO2: 96%  Weight: 112 lb (50.8 kg)  Height: 5\' 3"  (1.6 m)   Body mass index is 19.84 kg/m.  Physical Exam Vitals reviewed.  Constitutional:      Appearance: Normal appearance.  HENT:     Head: Normocephalic.  Eyes:     Extraocular Movements: Extraocular movements intact.     Conjunctiva/sclera: Conjunctivae normal.     Pupils: Pupils are equal, round, and reactive to light.  Cardiovascular:     Rate and Rhythm: Normal rate and regular rhythm.     Pulses: Normal pulses.     Heart sounds: Normal heart sounds.  Pulmonary:     Effort: Pulmonary effort is normal.     Breath sounds: Normal breath sounds.  Abdominal:     General: There is no distension.     Palpations: Abdomen is soft.     Tenderness: There is no abdominal tenderness.     Comments: Refuses rectal exam.  Musculoskeletal:        General: Normal range of motion.     Cervical back: Normal range of motion.  Skin:    General: Skin is warm and dry.     Capillary Refill: Capillary refill takes less than 2 seconds.  Neurological:     General: No focal deficit present.     Mental Status: She is alert and oriented to person, place, and time.  Psychiatric:        Mood and Affect: Mood normal.        Behavior: Behavior normal.     ASSESSMENT & PLAN: Clinically stable.  Stable vital signs.  Benign abdominal examination. No red flag signs or symptoms.  Needs GI referral for diagnostic colonoscopy. [name] was seen today for blood in stools.  Diagnoses and all orders for this visit:  Rectal bleeding -     Ambulatory referral to Gastroenterology  History of hypertension  History of high cholesterol  History of osteoporosis  History of kidney transplant  History of bladder cancer  Patient Instructions  Lower Gastrointestinal Bleeding Lower gastrointestinal (GI) bleeding is the result of bleeding from the colon, the rectum, or the area of the opening of the buttocks (anal area). The colon is the last part of  the digestive tract, where stool (feces) is formed. A person with lower GI bleeding may see blood in or on his or her stool. The blood may be bright red. Lower GI bleeding often stops without treatment. Continued or heavy bleeding may be life-threatening and needs emergency treatment at a hospital. What are the causes? This condition may be caused by: Diverticulosis. This condition causes pouches to form in your colon over time. Diverticulitis. This is inflammation in areas where diverticulosis has occurred. Inflammatory bowel disease (IBD), or inflammation of the colon. Hemorrhoids, or swollen veins in the rectum.  Anal fissures, or painful tears in the anus. Tears are often caused by passing hard stools. Cancer of the colon or rectum, or polyps of the colon or rectum. Polyps are noncancerous growths. Coagulopathy. This is a disorder that makes it hard to form blood clots and causes easy bleeding. Arteriovenous malformation. This is an abnormal weakening of a blood vessel where an artery and a vein come together. What increases the risk? The following factors may make you more likely to develop this condition: Being older than age 58. Regular use of aspirin or NSAIDs, such as ibuprofen. Taking blood thinners (anticoagulants) or anti-platelet medicines. Having a history of high-dose X-ray treatment (radiation therapy) of the colon. Having recently had a colon polyp removed. Heavy use of alcohol, or use of nicotine products. What are the signs or symptoms? Symptoms of this condition include: Bright red blood or blood clots coming from your rectum. Bloody stools. Black or maroon-colored stools. Pain or cramping in your abdomen. Weakness or dizziness. Racing heartbeat. How is this diagnosed? This condition may be diagnosed based on: Your symptoms and medical history. A physical exam. During the exam, your health care provider will check for signs of blood loss, such as low blood pressure  and a fast pulse. Tests or procedures. These may include: Flexible sigmoidoscopy. In this procedure, a flexible tube with a camera on the end is used to examine your anus and the first part of your colon to look for the source of bleeding. Colonoscopy. This is similar to a flexible sigmoidoscopy, but the camera can extend all the way to the uppermost part of your colon. Blood tests to measure your red blood cell count and to check for coagulopathy. Angiogram. For this test, X-rays of your colon are taken after a dye or radioactive substance is injected into your bloodstream. This may be done to look for a bleeding site. How is this treated? Treatment for this condition depends on the cause of your bleeding. Heavy or ongoing bleeding is treated at a hospital. Treatment may include: Getting fluids through an IV inserted into one of your veins. Getting blood through an IV (blood transfusion). Stopping bleeding with high heat (coagulation), injections of certain medicines, or surgical clips. This can all be done during a colonoscopy. Having a procedure that involves first doing an angiogram and then blocking blood flow to the bleeding site (embolization). Stopping some of your regular medicines for a certain amount of time. Having surgery to remove part of your colon. This may be needed if bleeding is severe and does not lessen after other treatment. Follow these instructions at home: Eating and drinking  Eat foods that are high in fiber. This will help keep your stools soft. These foods include whole grains, legumes, fruits, and vegetables. Eating 1-3 prunes a day works well for many people. Drink enough fluid to keep your urine pale yellow. Do not drink alcohol. General instructions  Do not use any products that contain nicotine or tobacco, such as cigarettes, e-cigarettes, and chewing tobacco. If you need help quitting, ask your health care provider. Take over-the-counter and prescription  medicines only as told by your health care provider. You may need to avoid aspirin, NSAIDs, or other medicines that increase bleeding. Return to your normal activities as told by your health care provider. Ask your health care provider what activities are safe for you. Keep all follow-up visits as told by your health care provider. This is important. Contact a health care provider if: Your symptoms  do not improve. You feel nauseous, or you vomit. You have pain in your abdomen. You feel weak or dizzy. You need help to stop smoking or drinking alcohol. Get help right away if: Your bleeding increases. You have severe weakness or you faint. You have sudden or severe cramps in your back or abdomen. You vomit blood. You pass large blood clots in your stool. Any of your other symptoms get worse. These symptoms may represent a serious problem that is an emergency. Do not wait to see if the symptoms will go away. Get medical help right away. Call your local emergency services (911 in the [place] Do not drive yourself to the hospital. Summary If you have lower gastrointestinal (GI) bleeding, you may see blood in or on your stool (feces). The blood may be bright red. Take over-the-counter and prescription medicines only as told by your health care provider. You may need to avoid aspirin, NSAIDs, or other medicines that increase bleeding. Keep all follow-up visits as told by your health care provider. This is important. Get help right away if your symptoms get worse. This information is not intended to replace advice given to you by your health care provider. Make sure you discuss any questions you have with your health care provider. Document Revised: 01/28/2019 Document Reviewed: 01/28/2019 Elsevier Patient Education  2022 [place], MD [facility]

## 2020-12-10 NOTE — Telephone Encounter (Signed)
Reviewed.  Schedule an appointment with ANY available provider.

## 2020-12-10 NOTE — Telephone Encounter (Signed)
Hey Dr. Henrene Pastor,   We received a referral for patient for rectal bleeding. Patient have seen GI provider 06/2020. Records are in epic. As DOD, 12/10/20 PM, could you please review and advise on scheduling?  Thank you

## 2020-12-12 NOTE — Telephone Encounter (Signed)
Spoke with patient. Have appt schedule 10/31 with Amy

## 2020-12-16 ENCOUNTER — Ambulatory Visit: Payer: Medicare Other | Admitting: Physician Assistant

## 2020-12-17 ENCOUNTER — Inpatient Hospital Stay: Payer: Medicare Other | Attending: Oncology

## 2020-12-17 ENCOUNTER — Other Ambulatory Visit: Payer: Self-pay

## 2020-12-17 DIAGNOSIS — Z940 Kidney transplant status: Secondary | ICD-10-CM | POA: Diagnosis not present

## 2020-12-17 DIAGNOSIS — D649 Anemia, unspecified: Secondary | ICD-10-CM

## 2020-12-17 DIAGNOSIS — Z85528 Personal history of other malignant neoplasm of kidney: Secondary | ICD-10-CM | POA: Insufficient documentation

## 2020-12-17 DIAGNOSIS — I10 Essential (primary) hypertension: Secondary | ICD-10-CM | POA: Diagnosis not present

## 2020-12-17 DIAGNOSIS — Z8551 Personal history of malignant neoplasm of bladder: Secondary | ICD-10-CM | POA: Insufficient documentation

## 2020-12-17 DIAGNOSIS — M810 Age-related osteoporosis without current pathological fracture: Secondary | ICD-10-CM | POA: Insufficient documentation

## 2020-12-17 DIAGNOSIS — D539 Nutritional anemia, unspecified: Secondary | ICD-10-CM | POA: Insufficient documentation

## 2020-12-17 LAB — SAVE SMEAR(SSMR), FOR PROVIDER SLIDE REVIEW

## 2020-12-17 LAB — CBC WITH DIFFERENTIAL (CANCER CENTER ONLY)
Abs Immature Granulocytes: 0.02 10*3/uL (ref 0.00–0.07)
Basophils Absolute: 0.1 10*3/uL (ref 0.0–0.1)
Basophils Relative: 1 %
Eosinophils Absolute: 0.3 10*3/uL (ref 0.0–0.5)
Eosinophils Relative: 5 %
HCT: 30.5 % — ABNORMAL LOW (ref 36.0–46.0)
Hemoglobin: 9.6 g/dL — ABNORMAL LOW (ref 12.0–15.0)
Immature Granulocytes: 0 %
Lymphocytes Relative: 24 %
Lymphs Abs: 1.7 10*3/uL (ref 0.7–4.0)
MCH: 32.7 pg (ref 26.0–34.0)
MCHC: 31.5 g/dL (ref 30.0–36.0)
MCV: 103.7 fL — ABNORMAL HIGH (ref 80.0–100.0)
Monocytes Absolute: 0.8 10*3/uL (ref 0.1–1.0)
Monocytes Relative: 11 %
Neutro Abs: 4.1 10*3/uL (ref 1.7–7.7)
Neutrophils Relative %: 59 %
Platelet Count: 169 10*3/uL (ref 150–400)
RBC: 2.94 MIL/uL — ABNORMAL LOW (ref 3.87–5.11)
RDW: 12.8 % (ref 11.5–15.5)
WBC Count: 7 10*3/uL (ref 4.0–10.5)
nRBC: 0 % (ref 0.0–0.2)

## 2020-12-19 ENCOUNTER — Other Ambulatory Visit: Payer: Self-pay

## 2020-12-19 ENCOUNTER — Inpatient Hospital Stay: Payer: Medicare Other | Admitting: Oncology

## 2020-12-19 ENCOUNTER — Inpatient Hospital Stay: Payer: Medicare Other

## 2020-12-19 VITALS — BP 136/88 | HR 78 | Temp 98.4°F | Resp 20 | Wt 113.4 lb

## 2020-12-19 DIAGNOSIS — M810 Age-related osteoporosis without current pathological fracture: Secondary | ICD-10-CM | POA: Diagnosis not present

## 2020-12-19 DIAGNOSIS — Z8551 Personal history of malignant neoplasm of bladder: Secondary | ICD-10-CM | POA: Diagnosis not present

## 2020-12-19 DIAGNOSIS — D649 Anemia, unspecified: Secondary | ICD-10-CM

## 2020-12-19 DIAGNOSIS — Z940 Kidney transplant status: Secondary | ICD-10-CM

## 2020-12-19 DIAGNOSIS — I10 Essential (primary) hypertension: Secondary | ICD-10-CM | POA: Diagnosis not present

## 2020-12-19 DIAGNOSIS — D539 Nutritional anemia, unspecified: Secondary | ICD-10-CM

## 2020-12-19 DIAGNOSIS — Z85528 Personal history of other malignant neoplasm of kidney: Secondary | ICD-10-CM

## 2020-12-19 LAB — TSH: TSH: 2.798 u[IU]/mL (ref 0.350–4.500)

## 2020-12-19 LAB — RETICULOCYTES
Immature Retic Fract: 6 % (ref 2.3–15.9)
RBC.: 3.05 MIL/uL — ABNORMAL LOW (ref 3.87–5.11)
Retic Count, Absolute: 56.1 10*3/uL (ref 19.0–186.0)
Retic Ct Pct: 1.8 % (ref 0.4–3.1)

## 2020-12-19 LAB — VITAMIN B12: Vitamin B-12: 486 pg/mL (ref 180–914)

## 2020-12-19 NOTE — Progress Notes (Signed)
[name]   Requesting MD: Rexene Agent, Md [place],  [place] [id number]   Rhonda [name] 65 y.o.  January 01, 1956    Reason for Consult: Anemia   HPI: Rhonda [name] reports a chronic history of anemia.  Rhonda [name] believes the anemia predated her renal transplant in 2001.  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  Rhonda [name] is maintained on prednisone and tacrolimus.  Rhonda [name] relocated to [place] approximately 1 year ago.  Rhonda [name] is followed by Dr. Joelyn Oms.  A CBC on 09/04/2020 found the hemoglobin at 10.2, hematocrit 31.1%, MCV 97, white count 6.4, platelets 180,000, and absolute neutrophil count 4.0. Labs from 05/14/2020 found the BUN at 27, creatinine 1.56.  Rhonda [name] is referred for evaluation of anemia.  Rhonda [name] reports blood in the stool for several years.  Rhonda [name] has bleeding when wiping.  Rhonda [name] has undergone hemorrhoid surgery in the past.    Past Medical History:  Diagnosis Date   Diabetes mellitus without complication ([place])    Hyperlipidemia    Hypertension    Kidney transplant recipient 2001    .  Dupuytren's contractures of the hands   .  Osteoporosis   .  Bladder cancer   .  Kidney cancer and a native kidney 2019   .  Bladder cancer, status post TURBT 2008   .  Hyperlipidemia   .  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  Nephro ureterectomy 2019 for malignancy  Medications: Reviewed  Allergies:  Allergies  Allergen Reactions   Penicillins Rash   Sulfa Antibiotics Rash    Family history: Her sister had anemia.  No other family history of anemia.  No family history of malignancy.  Social History:   Rhonda [name] lives with her husband in [place].  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  Rhonda [name] does not use cigarettes or alcohol.  No transfusion history.  No risk factor for  HIV or hepatitis.  Rhonda [name] has received COVID-19 vaccines  ROS:   Positives include: Blood in the stool for the past 2 years, hand/knee/feet arthralgias for 6 months, painful Dupuytren's contractures of the hands  A complete ROS was otherwise negative.  Physical Exam:  Blood pressure 136/88, pulse 78, temperature 98.4 F (36.9 C), temperature source Oral, resp. rate 20, weight 113 lb 6.4 oz (51.4 kg), SpO2 100 %.  HEENT: Oral cavity without visible mass, neck without mass Lungs: Clear bilaterally Cardiac: Regular rate and rhythm Abdomen: No hepatosplenomegaly, palpable transplant kidneys in the right lower abdomen  Vascular: No leg edema Lymph nodes: No cervical, supraclavicular, axillary, or inguinal nodes Neurologic: Alert and oriented, the motor exam appears intact in the upper and lower extremities bilaterally Skin: No rash Musculoskeletal: No spine tenderness   LAB:  CBC  Lab Results  Component Value Date   WBC 7.0 12/17/2020   HGB 9.6 (L) 12/17/2020   HCT 30.5 (L) 12/17/2020   MCV 103.7 (H) 12/17/2020   PLT 169 12/17/2020   NEUTROABS 4.1 12/17/2020    Blood smear: The platelets appear normal in number, no platelet clumps.  Few ovalocytes, the polychromasia is not increased.  Moderate variation in red cell size.  Mild rouleaux?Marland Kitchen  The majority of the white cells are mature neutrophils and lymphocytes.  No hypersegmented neutrophils.  Few bands.  No blasts  or other young forms are seen.  Slight increase in eosinophils.    CMP  Lab Results  Component Value Date   NA 138 07/25/2020   K 4.6 07/25/2020   CL 107 07/25/2020   CO2 23 07/25/2020   GLUCOSE 82 07/25/2020   BUN 42 (H) 07/25/2020   CREATININE 1.25 (H) 07/25/2020   CALCIUM 9.0 07/25/2020        Assessment/Plan:   Macrocytic anemia  Renal transplant 2001, maintained on prednisone and tacrolimus  3.   Renal cell carcinoma of native kidney 2019, status post right nephro ureterectomy  4.   History of  bladder cancer, status post TURBT 2008  5.   Hypertension  6.   Osteoporosis  7.   Hyperlipidemia   Disposition:   Rhonda [name] is referred for evaluation of anemia.  Rhonda [name] reports a chronic history of anemia dating back at least 20 years.  Review of the peripheral blood smear today does not indicate a specific cause for the anemia . The differential diagnosis includes anemia secondary to toxicity from tacrolimus and other etiologies.  We will check a vitamin B12 level, TSH, and myeloma panel.  Rhonda [name] does not have blood smear evidence of hemolysis, but we will check a reticulocyte count and direct Coombs test.  There is no history of chronic liver disease.  The anemia could be in part related to chronic renal failure and tacrolimus.  I have a low clinical suspicion for myelodysplasia given the chronicity of the anemia.  We will obtain additional remote CBC data.  Rhonda [name] will see gastroenterology to evaluate the rectal bleeding.  Ms. Carducci will return for an office visit in approximately 6 weeks.  Betsy Coder, MD  12/19/2020, 2:23 PM

## 2020-12-20 ENCOUNTER — Encounter: Payer: Self-pay | Admitting: *Deleted

## 2020-12-20 LAB — DIRECT ANTIGLOBULIN TEST (NOT AT ARMC)
DAT, IgG: NEGATIVE
DAT, complement: NEGATIVE

## 2020-12-20 LAB — KAPPA/LAMBDA LIGHT CHAINS
Kappa free light chain: 37 mg/L — ABNORMAL HIGH (ref 3.3–19.4)
Kappa, lambda light chain ratio: 1.88 — ABNORMAL HIGH (ref 0.26–1.65)
Lambda free light chains: 19.7 mg/L (ref 5.7–26.3)

## 2020-12-20 LAB — FOLATE RBC
Folate, Hemolysate: 458 ng/mL
Folate, RBC: 1445 ng/mL (ref >498)
Hematocrit: 31.7 % — ABNORMAL LOW (ref 34.0–46.6)

## 2020-12-20 NOTE — Progress Notes (Signed)
Left message with Dr Adin Hector office to request remote CBC results.

## 2020-12-23 LAB — PROTEIN ELECTROPHORESIS, SERUM
A/G Ratio: 1.5 (ref 0.7–1.7)
Albumin ELP: 4.1 g/dL (ref 2.9–4.4)
Alpha-1-Globulin: 0.3 g/dL (ref 0.0–0.4)
Alpha-2-Globulin: 0.8 g/dL (ref 0.4–1.0)
Beta Globulin: 1 g/dL (ref 0.7–1.3)
Gamma Globulin: 0.8 g/dL (ref 0.4–1.8)
Globulin, Total: 2.8 g/dL (ref 2.2–3.9)
Total Protein ELP: 6.9 g/dL (ref 6.0–8.5)

## 2020-12-24 ENCOUNTER — Telehealth: Payer: Self-pay | Admitting: *Deleted

## 2020-12-24 LAB — IMMUNOFIXATION ELECTROPHORESIS
IgA: 163 mg/dL (ref 87–352)
IgG (Immunoglobin G), Serum: 819 mg/dL (ref 586–1602)
IgM (Immunoglobulin M), Srm: 67 mg/dL (ref 26–217)
Total Protein ELP: 6.9 g/dL (ref 6.0–8.5)

## 2020-12-24 NOTE — Telephone Encounter (Signed)
-----   Message from Ladell Pier, MD sent at 12/23/2020  8:29 PM EST ----- Please call patient, the labs we obtained are unremarkable, the anemia may be related to medication and chronic kidney disease, follow-up as scheduled

## 2020-12-24 NOTE — Telephone Encounter (Signed)
Patient notified that protein studies were negative. Anemia most likely from chronic kidney disease and medications. F/U as scheduled.

## 2020-12-30 ENCOUNTER — Ambulatory Visit: Payer: Medicare Other | Admitting: Emergency Medicine

## 2021-01-01 ENCOUNTER — Ambulatory Visit: Payer: Medicare Other | Admitting: Nurse Practitioner

## 2021-01-01 ENCOUNTER — Other Ambulatory Visit (INDEPENDENT_AMBULATORY_CARE_PROVIDER_SITE_OTHER): Payer: Medicare Other

## 2021-01-01 ENCOUNTER — Encounter: Payer: Self-pay | Admitting: Nurse Practitioner

## 2021-01-01 VITALS — BP 128/74 | HR 81 | Ht 63.0 in | Wt 116.0 lb

## 2021-01-01 DIAGNOSIS — K625 Hemorrhage of anus and rectum: Secondary | ICD-10-CM | POA: Diagnosis not present

## 2021-01-01 DIAGNOSIS — Z8719 Personal history of other diseases of the digestive system: Secondary | ICD-10-CM

## 2021-01-01 DIAGNOSIS — R140 Abdominal distension (gaseous): Secondary | ICD-10-CM

## 2021-01-01 DIAGNOSIS — D509 Iron deficiency anemia, unspecified: Secondary | ICD-10-CM

## 2021-01-01 LAB — VITAMIN B12: Vitamin B-12: 590 pg/mL (ref 211–911)

## 2021-01-01 LAB — FERRITIN: Ferritin: 34.3 ng/mL (ref 10.0–291.0)

## 2021-01-01 MED ORDER — NA SULFATE-K SULFATE-MG SULF 17.5-3.13-1.6 GM/177ML PO SOLN: 1.0000 | Freq: Once | ORAL | 0 refills | Status: AC

## 2021-01-01 NOTE — Progress Notes (Signed)
Assessment and plan reviewed 

## 2021-01-01 NOTE — Progress Notes (Signed)
ASSESSMENT AND PLAN    # 65 yo female with multiple medical problems, self -referred for evaluation of several GI concerns. Very limited records in [place].   # History of diverticulitis. Patient says she was hospitalized for diverticulitis in [place],  [place] in 2017. Since then has had episodes of recurrent LLQ pain,  managed with tylenol and rest. No episodes in last several months.  Not clear that all the episodes of LLQ pain are due related to diverticular disease as spicy foods tend to cause the same pain  --If she gets recurrent LLQ pain then we will likely proceed with a CT scan  # Painless rectal bleeding with bowel movements x 2 years  --[employer] but not on a regular basis ( only when she thinks about it). Her bowel movements are generally normal, just occasional constipation and diarrhea. -- Realizing that she had a negative Cologuard last year, will arrange for colonoscopy given the rectal bleeding  ( and also history of anemia)  She says a Cologuard was done because she was reluctant to proceed with colonoscopy due to dread for bowel prep.   # Bloating, chronic. Better after omitting wheat bread and wheat pasta from diet --tTg, IgA   # Macrocytic anemia, possibly multifactorial. Hgb 9.6.  Patient gives a history of CKD, possibly contributing to anemia. No overt GI bleeding.  --Folate is normal.  --check B12 --iron studies.  HISTORY OF PRESENT ILLNESS     Chief Complaint :  establishing care. History of history of diverticulitis, rectal bleeding, bloating   Rhonda [name] is a 65 y.o. female with a past medical history significant for diverticulitis, kidney transplant 2001 then subsequent RCC s/p nephrectomy,  bladder cancer in 2006 s/p chemotherapy, hyperlipidemia, hypertension, osteoporosis. See PMH below for any additional history.   Patient is a 65 year old female , previously seen by [facility] ( one visit) but requested transfer of care  to Dr. Henrene Pastor who has accepted the transfer of care.    Patient was seen late May by [facility] for evaluation of bloating, heartburn, intermittent rectal bleeding, and history of diverticulitis. Patient was was having recurrent LLQ discomfort which she thought was recurrent diverticulitis (she was hospitalized in 2017 with diverticulitis). There was mention of CT scan if symptoms didn't improve. There was also discussion about having a colonoscopy. Patient decided she wanted to have a GI provider closer to home   Over the last year patient has 3 episodes of LLQ pain, each time lasting lasting 3-4 days. Treats herself during episodes with Tylenol and rest. She mentions that spicy food can sometimes cause the same pain LLQ pain. She has chronic bloating but it has improved since she stopped eating wheat pasta and wheat bread.    Patient's believes her last colonoscopy was in 2004. She had a negative Cologuard in 2021, she was reluctant to have a colonoscopy due to dread of taking prep. She has had intermittent painless rectal bleeding for two years.    PREVIOUS GI EVALUATIONS:   None in almost 20 years  Negative Cologuard 2021   Past Medical History:  Diagnosis Date   Diabetes mellitus without complication ([place])    Hx of bladder cancer    Hyperlipidemia    Hypertension    Kidney transplant recipient      Past Surgical History:  Procedure Laterality Date   COLONOSCOPY     Kidney transplant 2001 N/A    Family History  Problem Relation Age  of Onset   Heart disease Mother    Diabetes Father    Stroke Father    Heart disease Sister    Breast cancer Sister    Ovarian cancer Sister    Colon cancer Neg Hx    Esophageal cancer Neg Hx    Pancreatic cancer Neg Hx    Stomach cancer Neg Hx    Social History   Tobacco Use   Smoking status: Former    Types: Cigarettes    Quit date: 02/17/1983    Years since quitting: 37.8   Smokeless tobacco: Never  Vaping Use   Vaping Use:  Never used  Substance Use Topics   Alcohol use: Not Currently   Drug use: Never   Current Outpatient Medications  Medication Sig Dispense Refill   b complex vitamins capsule Take 1 capsule by mouth every other day.     lisinopril (ZESTRIL) 5 MG tablet TAKE 1 TABLET(5 MG) BY MOUTH DAILY     lovastatin (MEVACOR) 20 MG tablet TAKE 1 TABLET(20 MG) BY MOUTH AT BEDTIME     predniSONE (DELTASONE) 1 MG tablet TAKE 4 TABLETS(4 MG) BY MOUTH DAILY     tacrolimus (PROGRAF) 1 MG capsule TAKE 3 CAPSULES BY MOUTH EVERY 12 HOURS     No current facility-administered medications for this visit.   Allergies  Allergen Reactions   Penicillins Rash   Sulfa Antibiotics Rash     Review of Systems: All systems reviewed and negative except where noted in HPI.    PHYSICAL EXAM :    Wt Readings from Last 3 Encounters:  01/01/21 116 lb (52.6 kg)  12/19/20 113 lb 6.4 oz (51.4 kg)  12/10/20 112 lb (50.8 kg)    BP 128/74   Pulse 81   Ht 5\' 3"  (1.6 m)   Wt 116 lb (52.6 kg)   SpO2 99%   BMI 20.55 kg/m  Constitutional:  Generally well appearing female in no acute distress. Psychiatric: Pleasant. Normal mood and affect. Behavior is normal. EENT: Pupils normal.  Conjunctivae are normal. No scleral icterus. Neck supple.  Cardiovascular: Normal rate, regular rhythm. No edema Pulmonary/chest: Effort normal and breath sounds normal. No wheezing, rales or rhonchi. Abdominal: Soft, nondistended, nontender. Bowel sounds active throughout. There are no masses palpable. No hepatomegaly. Neurological: Alert and oriented to person place and time. Skin: Skin is warm and dry. No rashes noted.  [name], NP  01/01/2021, 10:24 AM  Cc:  Referring Provider Raymond Gurney Lesle Chris, MD

## 2021-01-01 NOTE — Patient Instructions (Signed)
If you are age 65 or older, your body mass index should be between 23-30. Your Body mass index is 20.55 kg/m. If this is out of the aforementioned range listed, please consider follow up with your Primary Care Provider. ________________________________________________________  The [facility] providers would like to encourage you to use [facility] to communicate with providers for non-urgent requests or questions.  Due to long hold times on the telephone, sending your provider a message by [facility] may be a faster and more efficient way to get a response.  Please allow 48 business hours for a response.  Please remember that this is for non-urgent requests.  _______________________________________________________  Your provider has requested that you go to the basement level for lab work before leaving today. Press "B" on the elevator. The lab is located at the first door on the left as you exit the elevator.  You have been scheduled for a colonoscopy. Please follow written instructions given to you at your visit today.  Please pick up your prep supplies at the pharmacy within the next 1-3 days. If you use inhalers (even only as needed), please bring them with you on the day of your procedure.  Follow up pending the results of your Colonoscopy and labs.  Thank you for entrusting me with your care and choosing [facility].  [name], NP-C

## 2021-01-02 LAB — IRON AND TIBC
Iron Saturation: 32 % (ref 15–55)
Iron: 94 ug/dL (ref 27–139)
Total Iron Binding Capacity: 291 ug/dL (ref 250–450)
UIBC: 197 ug/dL (ref 118–369)

## 2021-01-02 LAB — TISSUE TRANSGLUTAMINASE, IGA: (tTG) Ab, IgA: <1 U/mL

## 2021-01-02 LAB — IGA: Immunoglobulin A: 177 mg/dL (ref 70–320)

## 2021-01-13 ENCOUNTER — Other Ambulatory Visit: Payer: Self-pay

## 2021-01-13 DIAGNOSIS — K625 Hemorrhage of anus and rectum: Secondary | ICD-10-CM

## 2021-01-13 DIAGNOSIS — R1032 Left lower quadrant pain: Secondary | ICD-10-CM

## 2021-01-13 NOTE — Telephone Encounter (Signed)
I have never seen the patient.  Paula's note reviewed.  She recommended CT scan of the abdomen and pelvis for recurrent pain. Please order contrast-enhanced CT scan of the abdomen pelvis "abdominal pain, rule out diverticulitis".  Further recommendations after the above.  CT has been completed.

## 2021-01-16 ENCOUNTER — Ambulatory Visit: Payer: Medicare Other | Admitting: Internal Medicine

## 2021-01-16 ENCOUNTER — Encounter: Payer: Self-pay | Admitting: Internal Medicine

## 2021-01-16 ENCOUNTER — Other Ambulatory Visit: Payer: Self-pay

## 2021-01-16 VITALS — BP 130/72 | HR 82 | Ht 63.0 in | Wt 113.4 lb

## 2021-01-16 DIAGNOSIS — M810 Age-related osteoporosis without current pathological fracture: Secondary | ICD-10-CM

## 2021-01-16 DIAGNOSIS — E559 Vitamin D deficiency, unspecified: Secondary | ICD-10-CM

## 2021-01-16 MED ORDER — ALENDRONATE SODIUM 10 MG PO TABS: 10.0000 mg | ORAL_TABLET | Freq: Every day | ORAL | 11 refills | Status: DC

## 2021-01-16 NOTE — Patient Instructions (Addendum)
Please start: - Fosamax 10 mg daily in am.  After 1 month, let me know how you do with it so maybe we can start Reclast (once a year).  Please come back for a follow-up appointment in 6 months.

## 2021-01-16 NOTE — Progress Notes (Signed)
Patient ID: Rhonda [name], female   DOB: [birth year], 65 y.o.   MRN: [id number]  This visit occurred during the SARS-CoV-2 public health emergency.  Safety protocols were in place, including screening questions prior to the visit, additional usage of staff PPE, and extensive cleaning of exam room while observing appropriate contact time as indicated for disinfecting solutions.   HPI  Rhonda [name] is a 65 y.o.-year-old female, initially referred by her PCP, Dr. Baird Cancer, returning for follow-up for  osteoporosis (OP).  She moved from [place] to [place] and then to [place].  Our first visit was 6 months ago.  Interim history: No falls or fractures since last visit. She has orthostasis. No vertigo/vision problems. She recently had rectal bleeding. She has a CT scan coming up in 4 days and then a colonoscopy.  Reviewed history: Patient is seen by Dr. Baird Cancer by nephrology for her history of rend-stage renal disease, status post renal transplant in 2001.  She is on prednisone 4 mg daily, Prograf, Tacrolimus.  Pt was dx with osteoporosis in 2019 on bone density scan.   's previous DXA scans are not available but after last visit we obtained another DXA scan: 07/30/2020 ([place]) Lumbar spine L1-L4 Femoral neck (FN)  T-score   -1.6 RFN: -3.5 LFN: -3.2   Previous OP treatments:  - Fosamax off and on for few years - after 2020 - per nephrologist in [place] ([place], [place]) - Cr increased to 1.5 >> stopped, but retrospectively, this could have been 2/2 Covid vaccine - we tried to start Prolia - but Out-of-pocket cost 255$ so she did not start - Fosamax 70 mg daily  - x1 mo >> developed HAs so she stopped  She has a history of vitamin D insufficiency:   Reviewed available vit D levels: Lab Results  Component Value Date   VD25OH 43.7 10/11/2020   VD25OH 23.5 (L) 07/25/2020  12/18/2019: Vitamin D 27.9 - on MVI  Pt is on Centrum for women  containing both calcium and vitamin D.  We added 2000 units vitamin D daily in 07/2020.  She is on a vegetarian diet.  Drinks almond milk.  She does some weight bearing exercises and also walks daily for more than 2 miles in the park near her [place].   She does not take high vitamin A doses.  No high dose steroid use.  Menopause was at 58-51 y/o.   FH of osteoporosis: mother, possibly 2 sisters (kyphosis).  No h/o hyper/hypocalcemia or hyperparathyroidism. No h/o kidney stones. Lab Results  Component Value Date   CALCIUM 9.0 07/25/2020   No h/o thyrotoxicosis. No TSH recent levels:  Lab Results  Component Value Date   TSH 2.798 12/19/2020   She has CKD stage IIIa. Last BUN/Cr: Lab Results  Component Value Date   BUN 42 (H) 07/25/2020   CREATININE 1.25 (H) 07/25/2020  06/04/2020: Cr 1.42 12/18/2019: BUN/creatinine 41/1.38, GFR 40.  12/18/2019: Alkaline phosphatase was normal, at 75 (44-121). 12/2020: SPEP normal  She also has a history of bladder cancer - had intravesical chemotherapy, no radiation therapy; diverticulitis; HTN, HL.  ROS:  + See HPI + SOB  I reviewed pt's medications, allergies, PMH, social hx, family hx, and changes were documented in the history of present illness. Otherwise, unchanged from my initial visit note.  Past Medical History:  Diagnosis Date   Diabetes mellitus without complication (HCC)    Hx of bladder cancer    Hyperlipidemia  Hypertension    Kidney transplant recipient     Social History   Socioeconomic History   Marital status: Married    Spouse name: Not on file   Number of children: 1   Years of education: Not on file   Highest education level: Not on file  Occupational History   Not on file  Tobacco Use   Smoking status: Former    Types: Cigarettes    Quit date: 02/17/1983    Years since quitting: 37.9   Smokeless tobacco: Never  Vaping Use   Vaping Use: Never used  Substance and Sexual Activity   Alcohol use: Not  Currently   Drug use: Never   Sexual activity: Not on file  Other Topics Concern   Not on file  Social History Narrative   Not on file   Social Determinants of Health   Financial Resource Strain: Not on file  Food Insecurity: Not on file  Transportation Needs: Not on file  Physical Activity: Not on file  Stress: Not on file  Social Connections: Not on file  Intimate Partner Violence: Not on file   Current Outpatient Medications on File Prior to Visit  Medication Sig Dispense Refill   b complex vitamins capsule Take 1 capsule by mouth every other day.     lisinopril (ZESTRIL) 5 MG tablet TAKE 1 TABLET(5 MG) BY MOUTH DAILY     lovastatin (MEVACOR) 20 MG tablet TAKE 1 TABLET(20 MG) BY MOUTH AT BEDTIME     predniSONE (DELTASONE) 1 MG tablet TAKE 4 TABLETS(4 MG) BY MOUTH DAILY     tacrolimus (PROGRAF) 1 MG capsule TAKE 3 CAPSULES BY MOUTH EVERY 12 HOURS     No current facility-administered medications on file prior to visit.   Allergies  Allergen Reactions   Penicillins Rash   Sulfa Antibiotics Rash   Family History  Problem Relation Age of Onset   Heart disease Mother    Diabetes Father    Stroke Father    Heart disease Sister    Breast cancer Sister    Ovarian cancer Sister    Colon cancer Neg Hx    Esophageal cancer Neg Hx    Pancreatic cancer Neg Hx    Stomach cancer Neg Hx     PE: BP 130/72 (BP Location: Right Arm, Patient Position: Sitting, Cuff Size: Normal)   Pulse 82   Ht 5\' 3"  (1.6 m)   Wt 113 lb 6.4 oz (51.4 kg)   SpO2 98%   BMI 20.09 kg/m  Wt Readings from Last 3 Encounters:  01/16/21 113 lb 6.4 oz (51.4 kg)  01/01/21 116 lb (52.6 kg)  12/19/20 113 lb 6.4 oz (51.4 kg)   Constitutional: Thin, in NAD. No kyphosis. Eyes: PERRLA, EOMI, no exophthalmos ENT: moist mucous membranes, no thyromegaly, no cervical lymphadenopathy Cardiovascular: RRR, No MRG Respiratory: CTA B Musculoskeletal: no deformities, strength intact in all 4 Skin: moist, warm, no  rashes Neurological: no tremor with outstretched hands, DTR normal in all 4  Assessment: 1. Osteoporosis  2.  Vitamin D insufficiency  Plan: 1. Osteoporosis -Likely postmenopausal/age-related, also, posttransplant and possibly genetic-positive family history of osteoporosis -On the latest bone density scan obtained after last visit, her T-scores were very low, with the lowest being -3.5.  We asked for the previous bone density scan reports from Dr. Cheri Kearns, her previous endocrinologist, but I did not receive them yet. -At last visit, we discussed about different options for osteoporosis treatment and she agreed to start  Prolia after discussion about benefits and possible side effects -Since last visit we tried to obtain this for her but the cost was quite high, at $255 as her co-pay.  Therefore, she requested to start back on Fosamax.  We started 70 mg daily but she stopped after 1 month due to developing headaches. -At this visit, we discussed about options for treatment.  1 option would be to try to obtain Prolia from the pharmacy and bring it here for the injection.  I advised her to check with her insurance to see if this is possible.  Another option is to switch to Reclast, however, I would like to make sure first that her headaches are not related to bisphosphonates in general.  I advised her to hold split Fosamax and take it every day, a lower dose, and let me know if she tolerates this well.  If she does, we can start Reclast.  I would probably recommend increasing the infusion time, all of her at least 20 minutes.  Another option would be to switch to osteoanabolic therapy.  However, this would probably be more expensive for her. -At last visit I also suggest to get 1000 to 1200 mg of calcium daily preferentially from diet and we also added a vitamin D supplement as her vitamin D level was low.  I recommended a low acid diet. -I also recommended weightbearing exercises and given a  handout from the National osteoporosis foundation.  At this visit, we again discussed about fall precautions. -We will need to check another bone density in 2 years from the previous. - will see pt back in 1 year  2.  Vitamin D insufficiency -On a multivitamin and I recommended to add 2000 unit vitamin D supplement dose daily -We will check a vitamin D level today -No generalized joint pains/muscle aches  Philemon Kingdom, MD PhD [facility]

## 2021-01-20 ENCOUNTER — Ambulatory Visit (HOSPITAL_BASED_OUTPATIENT_CLINIC_OR_DEPARTMENT_OTHER)
Admission: RE | Admit: 2021-01-20 | Discharge: 2021-01-20 | Disposition: A | Discharge status: Home / Self Care | Payer: Medicare Other | Source: Ambulatory Visit | Attending: Internal Medicine | Admitting: Internal Medicine

## 2021-01-20 ENCOUNTER — Encounter (HOSPITAL_BASED_OUTPATIENT_CLINIC_OR_DEPARTMENT_OTHER): Payer: Self-pay

## 2021-01-20 ENCOUNTER — Other Ambulatory Visit: Payer: Self-pay

## 2021-01-20 DIAGNOSIS — K625 Hemorrhage of anus and rectum: Secondary | ICD-10-CM | POA: Diagnosis present

## 2021-01-20 DIAGNOSIS — R1032 Left lower quadrant pain: Secondary | ICD-10-CM | POA: Diagnosis present

## 2021-01-30 ENCOUNTER — Inpatient Hospital Stay: Payer: Medicare Other | Attending: Oncology | Admitting: Oncology

## 2021-01-30 ENCOUNTER — Inpatient Hospital Stay: Payer: Medicare Other

## 2021-01-30 ENCOUNTER — Other Ambulatory Visit: Payer: Self-pay

## 2021-01-30 VITALS — BP 149/83 | HR 74 | Temp 98.1°F | Resp 20 | Ht 63.0 in | Wt 115.8 lb

## 2021-01-30 DIAGNOSIS — Z940 Kidney transplant status: Secondary | ICD-10-CM | POA: Insufficient documentation

## 2021-01-30 DIAGNOSIS — I10 Essential (primary) hypertension: Secondary | ICD-10-CM | POA: Diagnosis not present

## 2021-01-30 DIAGNOSIS — D649 Anemia, unspecified: Secondary | ICD-10-CM | POA: Diagnosis not present

## 2021-01-30 DIAGNOSIS — Z7952 Long term (current) use of systemic steroids: Secondary | ICD-10-CM | POA: Insufficient documentation

## 2021-01-30 DIAGNOSIS — Z8551 Personal history of malignant neoplasm of bladder: Secondary | ICD-10-CM | POA: Diagnosis not present

## 2021-01-30 DIAGNOSIS — M810 Age-related osteoporosis without current pathological fracture: Secondary | ICD-10-CM | POA: Insufficient documentation

## 2021-01-30 DIAGNOSIS — E785 Hyperlipidemia, unspecified: Secondary | ICD-10-CM | POA: Insufficient documentation

## 2021-01-30 DIAGNOSIS — D539 Nutritional anemia, unspecified: Secondary | ICD-10-CM | POA: Diagnosis not present

## 2021-01-30 LAB — CBC WITH DIFFERENTIAL (CANCER CENTER ONLY)
Abs Immature Granulocytes: 0.02 10*3/uL (ref 0.00–0.07)
Basophils Absolute: 0.1 10*3/uL (ref 0.0–0.1)
Basophils Relative: 1 %
Eosinophils Absolute: 0.3 10*3/uL (ref 0.0–0.5)
Eosinophils Relative: 4 %
HCT: 32.8 % — ABNORMAL LOW (ref 36.0–46.0)
Hemoglobin: 10.4 g/dL — ABNORMAL LOW (ref 12.0–15.0)
Immature Granulocytes: 0 %
Lymphocytes Relative: 23 %
Lymphs Abs: 1.8 10*3/uL (ref 0.7–4.0)
MCH: 32.2 pg (ref 26.0–34.0)
MCHC: 31.7 g/dL (ref 30.0–36.0)
MCV: 101.5 fL — ABNORMAL HIGH (ref 80.0–100.0)
Monocytes Absolute: 1 10*3/uL (ref 0.1–1.0)
Monocytes Relative: 12 %
Neutro Abs: 4.9 10*3/uL (ref 1.7–7.7)
Neutrophils Relative %: 60 %
Platelet Count: 193 10*3/uL (ref 150–400)
RBC: 3.23 MIL/uL — ABNORMAL LOW (ref 3.87–5.11)
RDW: 11.9 % (ref 11.5–15.5)
WBC Count: 8 10*3/uL (ref 4.0–10.5)
nRBC: 0 % (ref 0.0–0.2)

## 2021-01-30 NOTE — Progress Notes (Signed)
[place] OFFICE PROGRESS NOTE   Diagnosis: Anemia  INTERVAL HISTORY:   Ms. Gusman returns as scheduled.  No new complaint.  No bleeding.  She sees nephrology every 3 months.  Objective:  Vital signs in last 24 hours:  Blood pressure (!) 149/83, pulse 74, temperature 98.1 F (36.7 C), temperature source Oral, resp. rate 20, height $RemoveBe'5\' 3"'NAjymENhs$  (1.6 m), weight 115 lb 12.8 oz (52.5 kg), SpO2 100 %.    Lymphatics: No cervical or supraclavicular nodes Resp: Lungs clear bilaterally Cardio: Regular rate and rhythm GI: No hepatosplenomegaly Vascular: No leg edema   Lab Results:  Lab Results  Component Value Date   WBC 8.0 01/30/2021   HGB 10.4 (L) 01/30/2021   HCT 32.8 (L) 01/30/2021   MCV 101.5 (H) 01/30/2021   PLT 193 01/30/2021   NEUTROABS 4.9 01/30/2021    CMP  Lab Results  Component Value Date   NA 138 07/25/2020   K 4.6 07/25/2020   CL 107 07/25/2020   CO2 23 07/25/2020   GLUCOSE 82 07/25/2020   BUN 42 (H) 07/25/2020   CREATININE 1.25 (H) 07/25/2020   CALCIUM 9.0 07/25/2020    Medications: I have reviewed the patient's current medications.   Assessment/Plan: Macrocytic anemia  Renal transplant 2001, maintained on prednisone and tacrolimus  3.   Renal cell carcinoma of native kidney 2019, status post right nephro ureterectomy  4.   History of bladder cancer, status post TURBT 2008  5.   Hypertension  6.   Osteoporosis  7.   Hyperlipidemia     Disposition: Ms. Kienitz appears unchanged.  She has persistent mild macrocytic anemia.  The etiology of the anemia remains unclear.  The anemia could be related to tacrolimus, renal insufficiency, or potentially myelodysplasia.  She reports having anemia for many years.  We have records dating only back to October of this year.  We received additional records from nephrology, but this does not include any CBC data.  She will attempt to obtain this.  A myeloma panel was negative, and the vitamin  B12 level and TSH were normal when she was here in November.  The DAT was negative and the reticulocyte count was not elevated.  We discussed proceeding with a bone marrow biopsy.  She is comfortable with observation for now.  She will return for an office visit and CBC in 3 months.  We will check a liver panel when she is here in 3 months.    Betsy Coder, MD  01/30/2021  10:39 AM

## 2021-02-04 ENCOUNTER — Other Ambulatory Visit: Payer: Self-pay

## 2021-02-04 MED ORDER — DICYCLOMINE HCL 10 MG PO CAPS: 10.0000 mg | ORAL_CAPSULE | Freq: Two times a day (BID) | ORAL | 0 refills | Status: DC | PRN

## 2021-02-07 ENCOUNTER — Telehealth: Payer: Self-pay

## 2021-02-07 NOTE — Telephone Encounter (Signed)
Prior Authorization has been been started for Dicyclomine 10 mg capsules, through Cover my meds.

## 2021-02-07 NOTE — Telephone Encounter (Signed)
Approved today: Effective from 02/07/2021 through 02/07/2022. Drug Dicyclomine HCl 10MG  capsules Form [employer] [place] Medicare Part D Electronic Request Form (CB)

## 2021-02-20 ENCOUNTER — Encounter: Payer: Self-pay | Admitting: Internal Medicine

## 2021-02-20 NOTE — Telephone Encounter (Signed)
T, can we initiate Reclast? Ty! C

## 2021-02-27 NOTE — Telephone Encounter (Signed)
Patient is still waiting on the preauthorization

## 2021-03-05 ENCOUNTER — Other Ambulatory Visit: Payer: Self-pay

## 2021-03-05 ENCOUNTER — Encounter: Payer: Self-pay | Admitting: Internal Medicine

## 2021-03-05 ENCOUNTER — Ambulatory Visit (AMBULATORY_SURGERY_CENTER): Payer: Medicare Other | Admitting: Internal Medicine

## 2021-03-05 VITALS — BP 172/98 | HR 70 | Temp 97.8°F | Resp 12 | Ht 63.0 in | Wt 116.0 lb

## 2021-03-05 DIAGNOSIS — D509 Iron deficiency anemia, unspecified: Secondary | ICD-10-CM

## 2021-03-05 DIAGNOSIS — R1032 Left lower quadrant pain: Secondary | ICD-10-CM

## 2021-03-05 DIAGNOSIS — K648 Other hemorrhoids: Secondary | ICD-10-CM

## 2021-03-05 DIAGNOSIS — K625 Hemorrhage of anus and rectum: Secondary | ICD-10-CM | POA: Diagnosis present

## 2021-03-05 DIAGNOSIS — K5730 Diverticulosis of large intestine without perforation or abscess without bleeding: Secondary | ICD-10-CM | POA: Diagnosis not present

## 2021-03-05 DIAGNOSIS — Z8719 Personal history of other diseases of the digestive system: Secondary | ICD-10-CM

## 2021-03-05 MED ORDER — SODIUM CHLORIDE 0.9 % IV SOLN: 500.0000 mL | Freq: Once | INTRAVENOUS | Status: DC

## 2021-03-05 NOTE — Progress Notes (Signed)
# 66 yo female with multiple medical problems, self -referred for evaluation of several GI concerns. Very limited records in [place].    # History of diverticulitis. Patient says she was hospitalized for diverticulitis in [place],  [place] in 2017. Since then has had episodes of recurrent LLQ pain,  managed with tylenol and rest. No episodes in last several months.  Not clear that all the episodes of LLQ pain are due related to diverticular disease as spicy foods tend to cause the same pain  --If she gets recurrent LLQ pain then we will likely proceed with a CT scan   # Painless rectal bleeding with bowel movements x 2 years  --[employer] but not on a regular basis ( only when she thinks about it). Her bowel movements are generally normal, just occasional constipation and diarrhea. -- Realizing that she had a negative Cologuard last year, will arrange for colonoscopy given the rectal bleeding  ( and also history of anemia)  She says a Cologuard was done because she was reluctant to proceed with colonoscopy due to dread for bowel prep.    # Bloating, chronic. Better after omitting wheat bread and wheat pasta from diet --tTg, IgA    # Macrocytic anemia, possibly multifactorial. Hgb 9.6.  Patient gives a history of CKD, possibly contributing to anemia. No overt GI bleeding.  --Folate is normal.  --check B12 --iron studies.   HISTORY OF PRESENT ILLNESS       Chief Complaint :  establishing care. History of history of diverticulitis, rectal bleeding, bloating    Rhonda [name] is a 66 y.o. female with a past medical history significant for diverticulitis, kidney transplant 2001 then subsequent RCC s/p nephrectomy,  bladder cancer in 2006 s/p chemotherapy, hyperlipidemia, hypertension, osteoporosis. See PMH below for any additional history.    Patient is a 66 year old female , previously seen by [facility] ( one visit) but requested transfer of care to Dr. Henrene Pastor who has  accepted the transfer of care.     Patient was seen late May by [facility] for evaluation of bloating, heartburn, intermittent rectal bleeding, and history of diverticulitis. Patient was was having recurrent LLQ discomfort which she thought was recurrent diverticulitis (she was hospitalized in 2017 with diverticulitis). There was mention of CT scan if symptoms didn't improve. There was also discussion about having a colonoscopy. Patient decided she wanted to have a GI provider closer to home    Over the last year patient has 3 episodes of LLQ pain, each time lasting lasting 3-4 days. Treats herself during episodes with Tylenol and rest. She mentions that spicy food can sometimes cause the same pain LLQ pain. She has chronic bloating but it has improved since she stopped eating wheat pasta and wheat bread.    Patient's believes her last colonoscopy was in 2004. She had a negative Cologuard in 2021, she was reluctant to have a colonoscopy due to dread of taking prep. She has had intermittent painless rectal bleeding for two years.      PREVIOUS GI EVALUATIONS:    None in almost 20 years   Negative Cologuard 2021         Past Medical History:  Diagnosis Date   Diabetes mellitus without complication ([place])     Hx of bladder cancer     Hyperlipidemia     Hypertension     Kidney transplant recipient               Past Surgical  History:  Procedure Laterality Date   COLONOSCOPY       Kidney transplant 2001 N/A           Family History  Problem Relation Age of Onset   Heart disease Mother     Diabetes Father     Stroke Father     Heart disease Sister     Breast cancer Sister     Ovarian cancer Sister     Colon cancer Neg Hx     Esophageal cancer Neg Hx     Pancreatic cancer Neg Hx     Stomach cancer Neg Hx      Social History         Tobacco Use   Smoking status: Former      Types: Cigarettes      Quit date: 02/17/1983      Years since quitting: 37.8   Smokeless  tobacco: Never  Vaping Use   Vaping Use: Never used  Substance Use Topics   Alcohol use: Not Currently   Drug use: Never          Current Outpatient Medications  Medication Sig Dispense Refill   b complex vitamins capsule Take 1 capsule by mouth every other day.       lisinopril (ZESTRIL) 5 MG tablet TAKE 1 TABLET(5 MG) BY MOUTH DAILY       lovastatin (MEVACOR) 20 MG tablet TAKE 1 TABLET(20 MG) BY MOUTH AT BEDTIME       predniSONE (DELTASONE) 1 MG tablet TAKE 4 TABLETS(4 MG) BY MOUTH DAILY       tacrolimus (PROGRAF) 1 MG capsule TAKE 3 CAPSULES BY MOUTH EVERY 12 HOURS        No current facility-administered medications for this visit.        Allergies  Allergen Reactions   Penicillins Rash   Sulfa Antibiotics Rash        Review of Systems: All systems reviewed and negative except where noted in HPI.      PHYSICAL EXAM :        Wt Readings from Last 3 Encounters:  01/01/21 116 lb (52.6 kg)  12/19/20 113 lb 6.4 oz (51.4 kg)  12/10/20 112 lb (50.8 kg)      BP 128/74    Pulse 81    Ht 5\' 3"  (1.6 m)    Wt 116 lb (52.6 kg)    SpO2 99%    BMI 20.55 kg/m  Constitutional:  Generally well appearing female in no acute distress. Psychiatric: Pleasant. Normal mood and affect. Behavior is normal. EENT: Pupils normal.  Conjunctivae are normal. No scleral icterus. Neck supple.  Cardiovascular: Normal rate, regular rhythm. No edema Pulmonary/chest: Effort normal and breath sounds normal. No wheezing, rales or rhonchi. Abdominal: Soft, nondistended, nontender. Bowel sounds active throughout. There are no masses palpable. No hepatomegaly. Neurological: Alert and oriented to person place and time. Skin: Skin is warm and dry. No rashes noted.   [name], NP  01/01/2021, 10:24 AM   Cc:  Referring Provider Jolinda Croak, MD   Patient was seen by the GI nurse practitioner as outlined above.  Subsequent CT scan revealed diverticulosis without diverticulitis and incidental  gallstones.  She states that overall she is been feeling better.  She does have Bentyl but has not needed to use this.  She tolerated her prep.  Now for colonoscopy

## 2021-03-05 NOTE — Patient Instructions (Addendum)
Information on hemorrhoids and diverticulosis given to you.  Resume previous diet and medications.  Repeat colonoscopy in 10 years.   YOU HAD AN ENDOSCOPIC PROCEDURE TODAY AT [facility]:   Refer to the procedure report that was given to you for any specific questions about what was found during the examination.  If the procedure report does not answer your questions, please call your gastroenterologist to clarify.  If you requested that your care partner not be given the details of your procedure findings, then the procedure report has been included in a sealed envelope for you to review at your convenience later.  YOU SHOULD EXPECT: Some feelings of bloating in the abdomen. Passage of more gas than usual.  Walking can help get rid of the air that was put into your GI tract during the procedure and reduce the bloating. If you had a lower endoscopy (such as a colonoscopy or flexible sigmoidoscopy) you may notice spotting of blood in your stool or on the toilet paper. If you underwent a bowel prep for your procedure, you may not have a normal bowel movement for a few days.  Please Note:  You might notice some irritation and congestion in your nose or some drainage.  This is from the oxygen used during your procedure.  There is no need for concern and it should clear up in a day or so.  SYMPTOMS TO REPORT IMMEDIATELY:  Following lower endoscopy (colonoscopy or flexible sigmoidoscopy):  Excessive amounts of blood in the stool  Significant tenderness or worsening of abdominal pains  Swelling of the abdomen that is new, acute  Fever of 100F or higher  For urgent or emergent issues, a gastroenterologist can be reached at any hour by calling [phone]. Do not use MyChart messaging for urgent concerns.    DIET:  We do recommend a small meal at first, but then you may proceed to your regular diet.  Drink plenty of fluids but you should avoid alcoholic beverages for 24  hours.  ACTIVITY:  You should plan to take it easy for the rest of today and you should NOT DRIVE or use heavy machinery until tomorrow (because of the sedation medicines used during the test).    FOLLOW UP: Our staff will call the number listed on your records 48-72 hours following your procedure to check on you and address any questions or concerns that you may have regarding the information given to you following your procedure. If we do not reach you, we will leave a message.  We will attempt to reach you two times.  During this call, we will ask if you have developed any symptoms of COVID 19. If you develop any symptoms (ie: fever, flu-like symptoms, shortness of breath, cough etc.) before then, please call [phone].  If you test positive for Covid 19 in the 2 weeks post procedure, please call and report this information to [place].    If any biopsies were taken you will be contacted by phone or by letter within the next 1-3 weeks.  Please call us at [phone] if you have not heard about the biopsies in 3 weeks.    SIGNATURES/CONFIDENTIALITY: You and/or your care partner have signed paperwork which will be entered into your electronic medical record.  These signatures attest to the fact that that the information above on your After Visit Summary has been reviewed and is understood.  Full responsibility of the confidentiality of this discharge information lies with you and/or  your care-partner.

## 2021-03-05 NOTE — Progress Notes (Signed)
Sedate, gd SR, tolerated procedure well, VSS, report to RN 

## 2021-03-05 NOTE — Op Note (Signed)
[place] Patient Name: Rhonda [name] Procedure Date: 03/05/2021 9:36 AM MRN: [id number] Endoscopist: Docia Chuck. Henrene Pastor , MD Age: 65 Referring MD:  Date of Birth: 08-08-1955 Gender: Female Account #: [id number] Procedure:                Colonoscopy Indications:              Abdominal pain in the left lower quadrant, Rectal                            bleeding Medicines:                Monitored Anesthesia Care Procedure:                Pre-Anesthesia Assessment:                           - Prior to the procedure, a History and Physical                            was performed, and patient medications and                            allergies were reviewed. The patient's tolerance of                            previous anesthesia was also reviewed. The risks                            and benefits of the procedure and the sedation                            options and risks were discussed with the patient.                            All questions were answered, and informed consent                            was obtained. Prior Anticoagulants: The patient has                            taken no previous anticoagulant or antiplatelet                            agents. ASA Grade Assessment: II - A patient with                            mild systemic disease. After reviewing the risks                            and benefits, the patient was deemed in                            satisfactory condition to undergo the procedure.  After obtaining informed consent, the colonoscope                            was passed under direct vision. Throughout the                            procedure, the patient's blood pressure, pulse, and                            oxygen saturations were monitored continuously. The                            Olympus Colonoscope #[id number] was introduced through                            the anus and advanced to the the cecum, identified                             by appendiceal orifice and ileocecal valve. The                            ileocecal valve, appendiceal orifice, and rectum                            were photographed. The quality of the bowel                            preparation was excellent. The colonoscopy was                            performed without difficulty. The patient tolerated                            the procedure well. The bowel preparation used was                            SUPREP via split dose instruction. Scope In: 9:54:32 AM Scope Out: 10:09:14 AM Scope Withdrawal Time: 0 hours 8 minutes 44 seconds  Total Procedure Duration: 0 hours 14 minutes 42 seconds  Findings:                 Multiple small and large-mouthed diverticula were                            found in the left colon and right colon.                           Internal hemorrhoids were found during                            retroflexion. The hemorrhoids were small. Complications:            No immediate complications. Estimated blood loss:  None. Estimated Blood Loss:     Estimated blood loss: none. Impression:               - Diverticulosis in the left colon and in the right                            colon.                           - Internal hemorrhoids.                           - No specimens collected. Recommendation:           - Repeat colonoscopy in 10 years for screening                            purposes.                           - Patient has a contact number available for                            emergencies. The signs and symptoms of potential                            delayed complications were discussed with the                            patient. Return to normal activities tomorrow.                            Written discharge instructions were provided to the                            patient.                           - Resume previous diet.                            - Continue present medications. Docia Chuck. Henrene Pastor, MD 03/05/2021 10:23:49 AM This report has been signed electronically.

## 2021-03-05 NOTE — Progress Notes (Signed)
VS by CW  Pt's states no medical or surgical changes since previsit or office visit.  

## 2021-03-07 ENCOUNTER — Telehealth: Payer: Self-pay | Admitting: *Deleted

## 2021-03-07 NOTE — Telephone Encounter (Signed)
Attempted f/u phone call. No answer. Left message. °

## 2021-03-07 NOTE — Telephone Encounter (Signed)
°  Follow up Call-  Call back number 03/05/2021  Post procedure Call Back phone  # [phone]  Permission to leave phone message Yes  Some recent data might be hidden    unable to leave a message.  no vm picked up.

## 2021-03-14 NOTE — Telephone Encounter (Signed)
Pt is calling to see if she can get a call back to get started on her injections.

## 2021-03-14 NOTE — Telephone Encounter (Deleted)
Pt is calling to see if she can get a call back to get started on her injections.

## 2021-03-31 ENCOUNTER — Telehealth: Payer: Self-pay | Admitting: Nutrition

## 2021-03-31 NOTE — Telephone Encounter (Signed)
Patient has been scheduled. She is going to call her insurance for the cost.  She says she can not wait any longer.  She was given the Charge code for this.

## 2021-04-01 ENCOUNTER — Telehealth: Payer: Self-pay | Admitting: Nutrition

## 2021-04-01 NOTE — Telephone Encounter (Signed)
LVM that her cost will be $345.00, with phone number to call me back to confirm appoiintment

## 2021-04-07 ENCOUNTER — Telehealth: Payer: Self-pay | Admitting: Nutrition

## 2021-04-07 NOTE — Telephone Encounter (Signed)
Patient willing to come tomorrow for reclast infusion.

## 2021-04-07 NOTE — Telephone Encounter (Signed)
She canceled her 2/21 appointment with me, but I called her and she is willing to come in tomorrow for this.

## 2021-04-08 ENCOUNTER — Ambulatory Visit: Payer: Medicare Other

## 2021-04-08 ENCOUNTER — Ambulatory Visit (INDEPENDENT_AMBULATORY_CARE_PROVIDER_SITE_OTHER): Payer: Medicare Other | Admitting: Nutrition

## 2021-04-08 ENCOUNTER — Other Ambulatory Visit: Payer: Self-pay

## 2021-04-08 DIAGNOSIS — M810 Age-related osteoporosis without current pathological fracture: Secondary | ICD-10-CM | POA: Diagnosis not present

## 2021-04-08 NOTE — Patient Instructions (Signed)
Drink 4-5 glasses of water to day Continue to take your calcium and vit. D per DR. Gherghe's orders

## 2021-04-08 NOTE — Progress Notes (Signed)
Per Dr. Arman Filter office note on 01/16/21, and after discussion of how this medication works, and side effects, the patient signed the consent, and an IV was started in patient's right arm.  Due to normal saline infiltrations X3, per myself and Kieth Brightly, it was suggested that she go to [facility] hospital for this infusion.  Instead, she approved one more stick,and the infusion was successful in patient's left forearm.  The IV was infused with 2ccs of normal saline and no infiltration was noted.  5mg . Of zolendronic acid was started at 9:55AM and infused until 10:30AM.  Patient denied symptoms of dizziness or discomfort at the infusion site.  The IV was flushed with 2 ccs of normal saline and was discontinued.  The site showed no signs of redness or bruising.  She was encouraged to drink 4-5 glasses of water today, and to continue to take her calcium and Vit. D per Dr. Arman Filter orders.  She agreed to do this and had no final questions.

## 2021-05-01 ENCOUNTER — Inpatient Hospital Stay: Payer: Medicare Other

## 2021-05-01 ENCOUNTER — Inpatient Hospital Stay: Payer: Medicare Other | Admitting: Oncology

## 2021-05-06 NOTE — Telephone Encounter (Signed)
Opened in error

## 2022-01-20 ENCOUNTER — Encounter: Payer: Medicare Other | Attending: Family Medicine | Admitting: Dietician

## 2022-01-20 VITALS — Ht 63.0 in | Wt 106.0 lb

## 2022-01-20 DIAGNOSIS — E119 Type 2 diabetes mellitus without complications: Secondary | ICD-10-CM | POA: Insufficient documentation

## 2022-01-23 ENCOUNTER — Encounter: Payer: Self-pay | Admitting: Dietician

## 2022-01-23 NOTE — Progress Notes (Signed)
Patient was seen on 01/20/2022 for the first of a series of three diabetes self-management courses at [facility].  Patient Education Plan per assessed needs and concerns is to attend three course education program for Diabetes Self Management Education.  A1C was 6.9% on 12/19/2021.  The following learning objectives were met by the patient during this class: Describe diabetes, types of diabetes and pathophysiology State some common risk factors for diabetes Defines the role of glucose and insulin Describe the relationship between diabetes and cardiovascular and other risks State the members of the Healthcare Team States the rationale for glucose monitoring and when to test State their individual [place] the importance of logging glucose readings and how to interpret the readings Identifies A1C target Explain the correlation between A1c and eAG values State symptoms and treatment of high blood glucose and low blood glucose Explain proper technique for glucose testing and identify proper sharps disposal  Handouts given during class include: How to Thrive:  A Guide for Your Journey with Diabetes by the ADA Meal Plan Card and carbohydrate content list Dietary intake form Low Sodium Flavoring Tips Types of Fats Dining Out Label reading Snack list The diabetes portion plate Diabetes Resources A1c to eAG Conversion Chart Blood Glucose Log Diabetes Recommended Care Schedule Support Group Diabetes Success Plan Core Class Satisfaction Survey   Follow-Up Plan: Attend core 2

## 2022-01-27 ENCOUNTER — Encounter: Payer: Medicare Other | Attending: Family Medicine | Admitting: Dietician

## 2022-01-27 ENCOUNTER — Encounter: Payer: Self-pay | Admitting: Dietician

## 2022-01-27 DIAGNOSIS — E119 Type 2 diabetes mellitus without complications: Secondary | ICD-10-CM | POA: Diagnosis present

## 2022-01-27 NOTE — Progress Notes (Signed)
Patient was seen on 01/27/2022 for the second of a series of three diabetes self-management courses at the [facility]. The following learning objectives were met by the patient during this class:  Describe the role of different macronutrients on glucose Explain how carbohydrates affect blood glucose State what foods contain the most carbohydrates Demonstrate carbohydrate counting Demonstrate how to read Nutrition Facts food label Describe effects of various fats on heart health Describe the importance of good nutrition for health and healthy eating strategies Describe techniques for managing your shopping, cooking and meal planning List strategies to follow meal plan when dining out Describe the effects of alcohol on glucose and how to use it safely  Goals:  Follow Diabetes Meal Plan as instructed  Aim to spread carbs evenly throughout the day  Aim for 3 meals per day and snacks as needed Include lean protein foods to meals/snacks  Monitor glucose levels as instructed by your doctor   Follow-Up Plan: Attend Core 3 Work towards following your personal food plan.

## 2022-02-03 ENCOUNTER — Encounter: Payer: Self-pay | Admitting: Dietician

## 2022-02-03 ENCOUNTER — Encounter: Payer: Medicare Other | Attending: Family Medicine | Admitting: Dietician

## 2022-02-03 DIAGNOSIS — E119 Type 2 diabetes mellitus without complications: Secondary | ICD-10-CM | POA: Insufficient documentation

## 2022-02-03 NOTE — Progress Notes (Signed)
Patient was seen on 02/03/2022 for the third of a series of three diabetes self-management courses at the [facility].   State the amount of activity recommended for healthy living Describe activities suitable for individual needs Identify ways to regularly incorporate activity into daily life Identify barriers to activity and ways to over come these barriers Identify diabetes medications being personally used and their primary action for lowering glucose and possible side effects Describe role of stress on blood glucose and develop strategies to address psychosocial issues Identify diabetes complications and ways to prevent them Explain how to manage diabetes during illness Evaluate success in meeting personal goal Establish 2-3 goals that they will plan to diligently work on  Goals:  I will count my carb choices at most meals and snacks Limit cheese I will be active 30 minutes or more 5 times a week and walk even when it is cold outside I will take my diabetes medications as scheduled I will eat less unhealthy fats by eating less cheese  To help manage stress I will  workout at least 3 times a week  Your patient has identified these potential barriers to change:  none  Your patient has identified their diabetes self-care support plan as  Family Support   Plan:  Attend Support Group as desired

## 2022-04-02 ENCOUNTER — Encounter: Payer: Self-pay | Admitting: Internal Medicine

## 2022-04-02 ENCOUNTER — Ambulatory Visit: Payer: Medicare Other | Admitting: Internal Medicine

## 2022-04-02 VITALS — BP 128/82 | HR 82 | Ht 63.0 in | Wt 102.8 lb

## 2022-04-02 DIAGNOSIS — M810 Age-related osteoporosis without current pathological fracture: Secondary | ICD-10-CM | POA: Diagnosis not present

## 2022-04-02 DIAGNOSIS — E559 Vitamin D deficiency, unspecified: Secondary | ICD-10-CM

## 2022-04-02 DIAGNOSIS — R7303 Prediabetes: Secondary | ICD-10-CM

## 2022-04-02 NOTE — Progress Notes (Signed)
Patient ID: Rhonda [name], female   DOB: Jun 21, 1955, 67 y.o.   MRN: [id number] HPI  Rhonda [name] is a 67 y.o.-year-old female, initially referred by her PCP, Dr. Baird Cancer, returning for follow-up for  osteoporosis (OP).  She moved from [place] to [place] and then to [place].  Last visit was 1 year and 3 months ago.  Interim history: No falls or fractures since last visit. No vertigo/vision problems. She continues to lose weight despite trying to eat high-calorie foods like nuts and avocado.  She was also advised to incorporate some meat, but she does not like this.  Reviewed history: Patient is seen by Dr. Baird Cancer by nephrology for her history of rend-stage renal disease, status post renal transplant in 2001.  She is on prednisone 4 mg daily, Prograf, Tacrolimus.  Pt was dx with osteoporosis in 2019 on bone density scan.   Pt's previous DXA scans are not available but after last visit we obtained another DXA scan: 07/30/2020 ([facility]) Lumbar spine L1-L4 Femoral neck (FN)  T-score   -1.6 RFN: -3.5 LFN: -3.2   Previous OP treatments:  - Fosamax off and on for few years - after 2020 - per nephrologist in [place] ([place]) - Cr increased to 1.5 >> stopped, but retrospectively, this could have been 2/2 Covid vaccine - we tried to start Prolia - but Out-of-pocket cost 255$ so she did not start - Fosamax 70 mg daily  - x1 mo >> developed HAs so she stopped - Reclast 04/08/2021 >> well-tolerated, however, her creatinine increased after the infusion.  It has decreased afterwards.  She has a history of vitamin D insufficiency:   Reviewed available vit D levels: 09/04/2021: Vitamin D 27 Lab Results  Component Value Date   VD25OH 43.7 10/11/2020   VD25OH 23.5 (L) 07/25/2020  12/18/2019: Vitamin D 27.9 - on MVI  Pt is on Centrum for women containing both calcium and vitamin D.  I advised her to add 2000 units vitamin D daily at last visit,  but she forgot.  She is on a vegetarian diet.  Drinks almond milk.  Eats a lot of tofu.  She is walking for exercise, but is not doing much weightbearing exercises.  She does not take high vitamin A doses.  No high dose steroid use.  Menopause was at 5-51 y/o.   FH of osteoporosis: mother, possibly 2 sisters (kyphosis).  No h/o hyper/hypocalcemia or hyperparathyroidism. No h/o kidney stones. 03/31/2022: Calcium 9.3 09/04/2021: Calcium 8.8 (8.7-10.3) Lab Results  Component Value Date   CALCIUM 9.0 07/25/2020   No h/o thyrotoxicosis. No TSH recent levels:  Lab Results  Component Value Date   TSH 2.798 12/19/2020   She has CKD stage IIIa. Last BUN/Cr: 03/31/2022: 33/1.18, GFR 51 09/04/2021 (nephrology): 41/1.37, GFR 43 Lab Results  Component Value Date   BUN 42 (H) 07/25/2020   CREATININE 1.25 (H) 07/25/2020  06/04/2020: Cr 1.42 12/18/2019: BUN/creatinine 41/1.38, GFR 40.  12/18/2019: Alkaline phosphatase was normal, at 75 (44-121). 12/2020: SPEP normal  She also has a history of bladder cancer - had intravesical chemotherapy, no radiation therapy; diverticulitis; HTN, HL.  ROS:  + See HPI  I reviewed pt's medications, allergies, PMH, social hx, family hx, and changes were documented in the history of present illness. Otherwise, unchanged from my initial visit note.  Past Medical History:  Diagnosis Date   Diabetes mellitus without complication (HCC)    Hx of bladder cancer    Hyperlipidemia  Hypertension    Kidney transplant recipient     Social History   Socioeconomic History   Marital status: Married    Spouse name: Not on file   Number of children: 1   Years of education: Not on file   Highest education level: Not on file  Occupational History   Not on file  Tobacco Use   Smoking status: Former    Types: Cigarettes    Quit date: 02/17/1983    Years since quitting: 39.1   Smokeless tobacco: Never  Vaping Use   Vaping Use: Never used  Substance and  Sexual Activity   Alcohol use: Not Currently   Drug use: Never   Sexual activity: Not on file  Other Topics Concern   Not on file  Social History Narrative   Not on file   Social Determinants of Health   Financial Resource Strain: Not on file  Food Insecurity: Not on file  Transportation Needs: Not on file  Physical Activity: Not on file  Stress: Not on file  Social Connections: Not on file  Intimate Partner Violence: Not on file   Current Outpatient Medications on File Prior to Visit  Medication Sig Dispense Refill   dicyclomine (BENTYL) 10 MG capsule Take 1 capsule (10 mg total) by mouth 2 (two) times daily as needed for spasms. (Patient not taking: Reported on 03/05/2021) 60 capsule 0   alendronate (FOSAMAX) 10 MG tablet Take 1 tablet (10 mg total) by mouth daily before breakfast. Take with a full glass of water on an empty stomach. 30 tablet 11   b complex vitamins capsule Take 1 capsule by mouth every other day.     ferrous sulfate 325 (65 FE) MG tablet Take by mouth.     lisinopril (ZESTRIL) 5 MG tablet TAKE 1 TABLET(5 MG) BY MOUTH DAILY     lovastatin (MEVACOR) 20 MG tablet TAKE 1 TABLET(20 MG) BY MOUTH AT BEDTIME     nitrofurantoin, macrocrystal-monohydrate, (MACROBID) 100 MG capsule Take 100 mg by mouth 2 (two) times daily. (Patient not taking: Reported on 03/05/2021)     predniSONE (DELTASONE) 1 MG tablet TAKE 4 TABLETS(4 MG) BY MOUTH DAILY     tacrolimus (PROGRAF) 1 MG capsule TAKE 3 CAPSULES BY MOUTH EVERY 12 HOURS     No current facility-administered medications on file prior to visit.   Allergies  Allergen Reactions   Penicillins Rash   Sulfa Antibiotics Rash   Family History  Problem Relation Age of Onset   Heart disease Mother    Diabetes Father    Stroke Father    Heart disease Sister    Breast cancer Sister    Ovarian cancer Sister    Colon cancer Neg Hx    Esophageal cancer Neg Hx    Pancreatic cancer Neg Hx    Stomach cancer Neg Hx     PE: BP  128/82 (BP Location: Right Arm, Patient Position: Sitting, Cuff Size: Normal)   Pulse 82   Ht 5' 3"$  (1.6 m)   Wt 102 lb 12.8 oz (46.6 kg)   SpO2 95%   BMI 18.21 kg/m  Wt Readings from Last 3 Encounters:  04/02/22 102 lb 12.8 oz (46.6 kg)  01/23/22 106 lb (48.1 kg)  03/05/21 116 lb (52.6 kg)   Constitutional: Thin, in NAD. No kyphosis. Eyes: EOMI, no exophthalmos ENT: no thyromegaly, no cervical lymphadenopathy Cardiovascular: RRR, No MRG Respiratory: CTA B Musculoskeletal: no deformities Skin: no rashes Neurological: no tremor with outstretched hands  Assessment: 1. Osteoporosis  2.  Vitamin D insufficiency  3.  Prediabetes  Plan: 1. Osteoporosis -This is likely postmenopausal/age-related, also posttransplant and possibly genetic-she has positive family history of osteoporosis -On the latest bone density scan, her T-scores were very low, with the lowest being -3.5.  We asked for the previous bone density scan reports from Dr. Cheri Kearns, her previous endocrinologist, but I did not receive them. -We previously tried to her on Prolia but this was too expensive (co-pay $255).  She requested to start back on Fosamax.  She tried this for 1 month but developed headaches so we had to stop.  At that time, we discussed about options for treatment including obtaining Prolia from the pharmacy and please bring it here to the clinic versus Reclast infusion versus osteo anabolic therapy.  She decided to have the Reclast infusion-had this 04/08/2021, here in clinic. -She tolerated the infusion well, however, she mentions that her creatinine increased afterwards.  I will advise the infusion center to administer the Reclast over double time. -We discussed about continuing with Reclast for another 2 years.  We can possibly take a drug holiday at that time. -We discussed at last visit about a low acid diet, adding a vitamin D supplement since her level was low and we also discussed about  weightbearing exercises.  I gave her  a handout from the National osteoporosis foundation.  She is now only walking, and I vies her again to incorporate some weightbearing exercises, ideally 5 days a week, for 30 minutes -She is aware about fall precautions -Plan to repeat another bone density 2 years from the previous, which would be in 4 months.  I advised her to send me a message at that time so I can order it - will see pt back in 1 year  2.  Vitamin D insufficiency -Last visit I advised her to add 2000 units vitamin D to her multivitamin supplement.  She forgot and is now just using the multivitamin. -No generalized aches and pains -Latest vitamin D level was reviewed from 09/04/2021 and this was still slightly low, at 27. -Will repeat this today  3.  Prediabetes -At this visit, she tells me that she is worried about diabetes.  I have asked this to 2 HbA1c levels within the prediabetic range and 1 in the diabetic range as follows: 08/18/2021: HbA1c 5.9% 12/19/2021: HbA1c 6.9% 03/31/2021: HbA1c 6.2% -We discussed that, at least per my records, she has prediabetes based on the above results.  We did discuss about working on her diet, especially as she is also losing weight and I suggested a referral to nutrition.  She did have some nutrition classes in the past.  As of now, I do not feel that she needs medication especially since her HbA1c improved. -We also discussed about weightbearing exercises which can also help with blood sugars.  Need to order Reclast - administer the Reclast over double time!  Component     Latest Ref Rng 04/02/2022  VITD     30.00 - 100.00 ng/mL 29.42 (L)   Vitamin D level is only slightly low. I would suggest to add 1000 units vitamin D daily to her multivitamin. We can go ahead with the Reclast infusion, but need to infuse this over double time: 30 min.  I also advised her to stay well-hydrated before the infusion.  Philemon Kingdom, MD PhD [facility]  Endocrinology

## 2022-04-02 NOTE — Patient Instructions (Addendum)
Please continue Reclast for now.  Will arrange for another infusion - at the Outpatient infusion center.  Please stop at the lab.  Please send me a message around 07/2022 to order the new bone density scan. [phone].   Please return in 1 year.

## 2022-04-03 ENCOUNTER — Encounter: Payer: Self-pay | Admitting: Internal Medicine

## 2022-04-03 LAB — VITAMIN D 25 HYDROXY (VIT D DEFICIENCY, FRACTURES): VITD: 29.42 ng/mL — ABNORMAL LOW (ref 30.00–100.00)

## 2022-04-06 ENCOUNTER — Telehealth: Payer: Self-pay | Admitting: Pharmacy Technician

## 2022-04-06 NOTE — Telephone Encounter (Signed)
Auth Submission: NO AUTH NEEDED Payer: [employer] MEDICARE Medication & CPT/J Code(s) submitted: Reclast (Zolendronic acid) [id number] Route of submission (phone, fax, portal):  Phone # Fax # Auth type: Buy/Bill Units/visits requested: X1 Reference number:  Approval from: 04/06/22 to 02/16/23

## 2022-04-09 ENCOUNTER — Ambulatory Visit (INDEPENDENT_AMBULATORY_CARE_PROVIDER_SITE_OTHER): Payer: Medicare Other | Admitting: *Deleted

## 2022-04-09 VITALS — BP 117/73 | HR 70 | Temp 97.4°F | Resp 16 | Ht 63.0 in | Wt 105.2 lb

## 2022-04-09 DIAGNOSIS — M810 Age-related osteoporosis without current pathological fracture: Secondary | ICD-10-CM | POA: Diagnosis not present

## 2022-04-09 MED ORDER — DIPHENHYDRAMINE HCL 25 MG PO CAPS: 25.0000 mg | ORAL_CAPSULE | Freq: Once | ORAL | Status: DC

## 2022-04-09 MED ORDER — SODIUM CHLORIDE 0.9 % IV SOLN: INTRAVENOUS | Status: DC

## 2022-04-09 MED ORDER — ZOLEDRONIC ACID 5 MG/100ML IV SOLN
5.0000 mg | Freq: Once | INTRAVENOUS | Status: AC
  Administered 2022-04-09: 5 mg via INTRAVENOUS
  Filled 2022-04-09: qty 100

## 2022-04-09 MED ORDER — ACETAMINOPHEN 325 MG PO TABS: 650.0000 mg | ORAL_TABLET | Freq: Once | ORAL | Status: DC

## 2022-04-09 NOTE — Progress Notes (Signed)
Diagnosis: Osteoporosis  Provider:  Marshell Garfinkel MD  Procedure: Infusion  IV Type: Peripheral, IV Location: R Antecubital  Reclast (Zolendronic Acid), Dose: 5 mg  Infusion Start Time: [id number] am  Infusion Stop Time: [id number] am  Post Infusion IV Care: Observation period completed and Peripheral IV Discontinued  Discharge: Condition: Good, Destination: Home . AVS Provided  Performed by:  Oren Beckmann, RN

## 2022-04-23 ENCOUNTER — Ambulatory Visit: Payer: Medicare Other | Admitting: Dietician

## 2022-07-21 ENCOUNTER — Other Ambulatory Visit (HOSPITAL_COMMUNITY): Payer: Self-pay | Admitting: Urology

## 2022-07-21 ENCOUNTER — Ambulatory Visit (HOSPITAL_COMMUNITY)
Admission: RE | Admit: 2022-07-21 | Discharge: 2022-07-21 | Disposition: A | Discharge status: Home / Self Care | Payer: Medicare Other | Source: Ambulatory Visit | Attending: Urology | Admitting: Urology

## 2022-07-21 DIAGNOSIS — C678 Malignant neoplasm of overlapping sites of bladder: Secondary | ICD-10-CM

## 2022-07-21 DIAGNOSIS — Z905 Acquired absence of kidney: Secondary | ICD-10-CM | POA: Diagnosis present

## 2022-07-21 DIAGNOSIS — C661 Malignant neoplasm of right ureter: Secondary | ICD-10-CM

## 2022-08-26 ENCOUNTER — Encounter: Payer: Self-pay | Admitting: Internal Medicine

## 2022-08-27 ENCOUNTER — Other Ambulatory Visit: Payer: Self-pay | Admitting: Internal Medicine

## 2022-08-27 DIAGNOSIS — M810 Age-related osteoporosis without current pathological fracture: Secondary | ICD-10-CM

## 2022-08-27 NOTE — Telephone Encounter (Signed)
Can you place the order for a bone density?

## 2022-10-31 IMAGING — CT CT ABD-PELV W/O CM
2 of 4 series · 16 of 46 positions shown, 18 images · non-contrast
Comparison: 04/18/2020

CLINICAL DATA: Left lower quadrant pain for 4 days. Nausea. Rectal
bleeding. Diverticulosis.

EXAM:
CT ABDOMEN AND PELVIS WITHOUT CONTRAST
TECHNIQUE: Multidetector CT imaging of the abdomen and pelvis was performed
following the standard protocol without IV contrast.

[Series 2: abd pel wo · axial · 0.62mm/px · z∈[+757,+1142]mm · 13 of 85 slices shown, 15 images]
[im 4/85  soft-tissue]
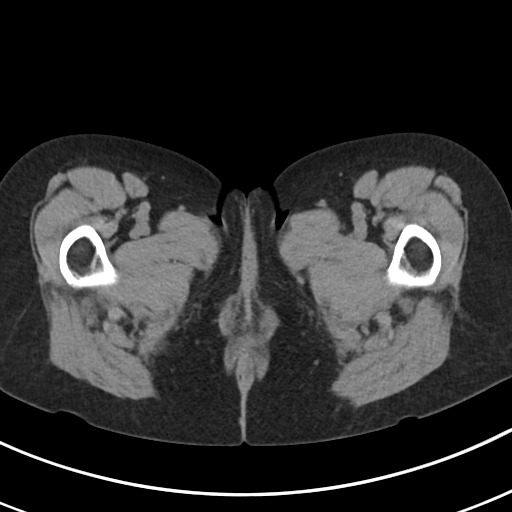
[im 4/85  bone]
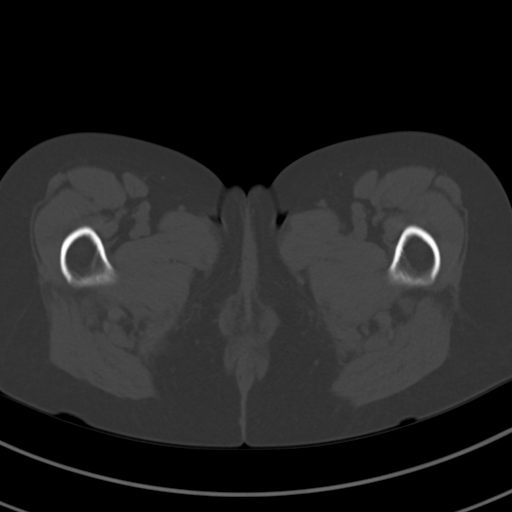
[im 10/85  soft-tissue]
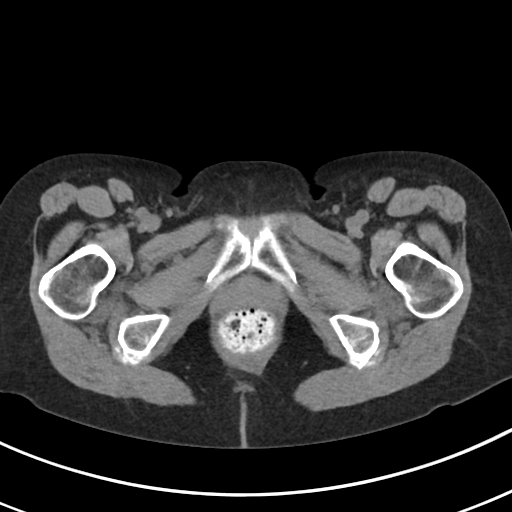
[im 17/85  soft-tissue]
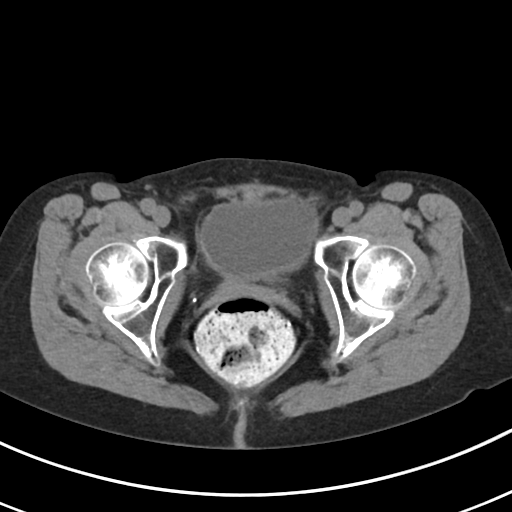
[im 23/85  soft-tissue]
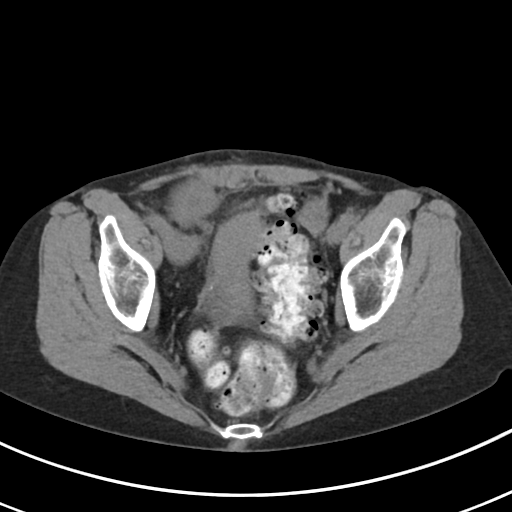
[im 30/85  soft-tissue]
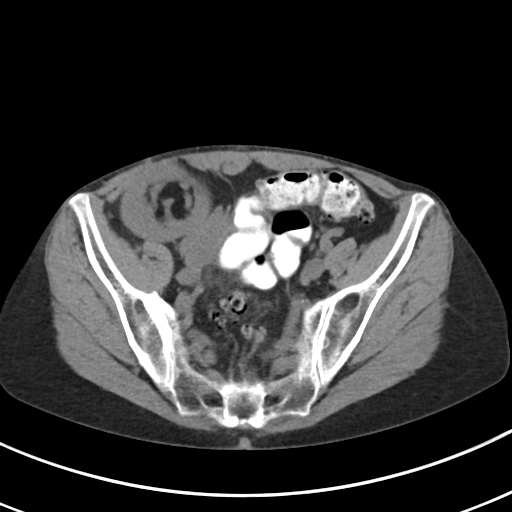
[im 36/85  soft-tissue]
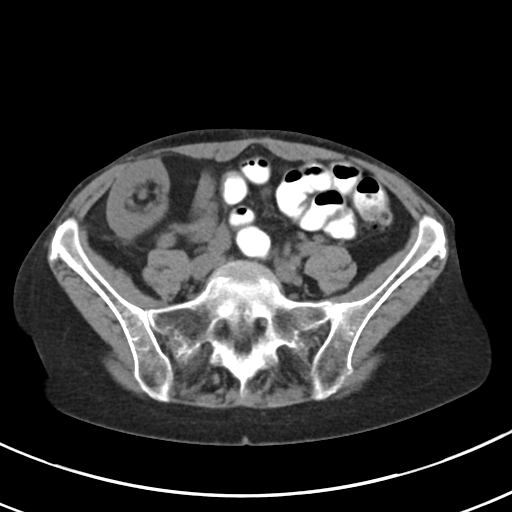
[im 43/85  soft-tissue]
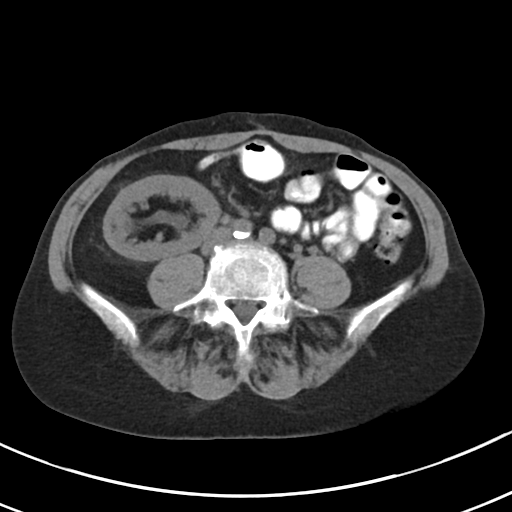
[im 49/85  soft-tissue]
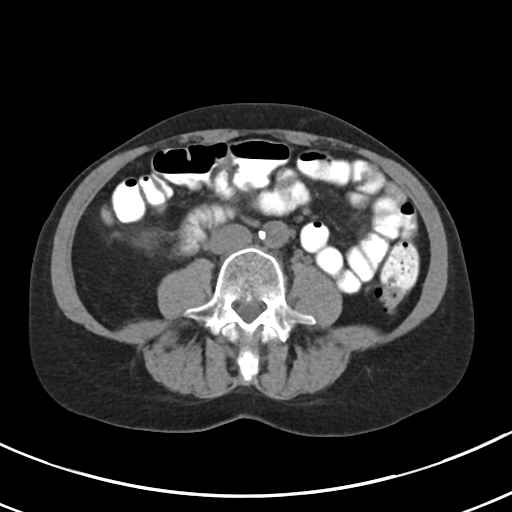
[im 55/85  soft-tissue]
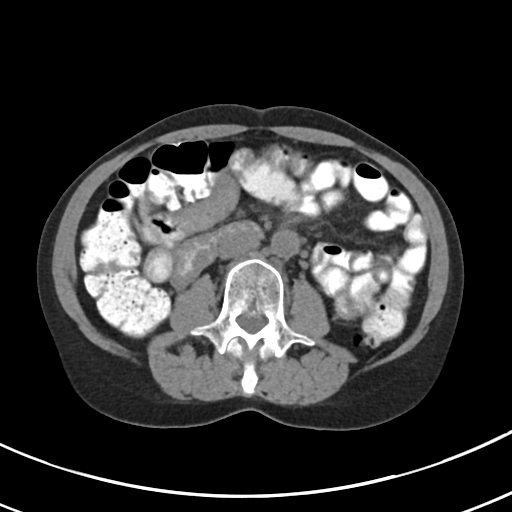
[im 55/85  bone]
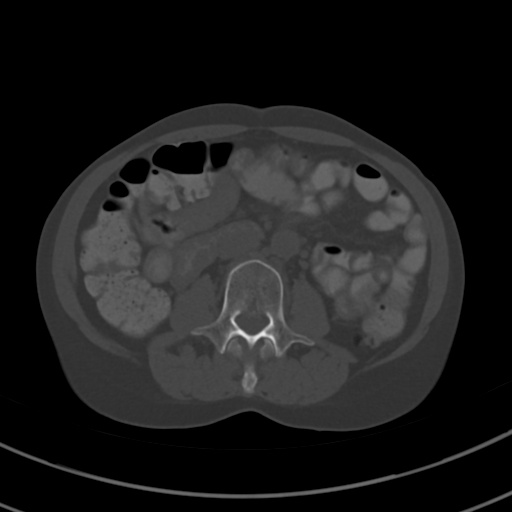
[im 62/85  soft-tissue]
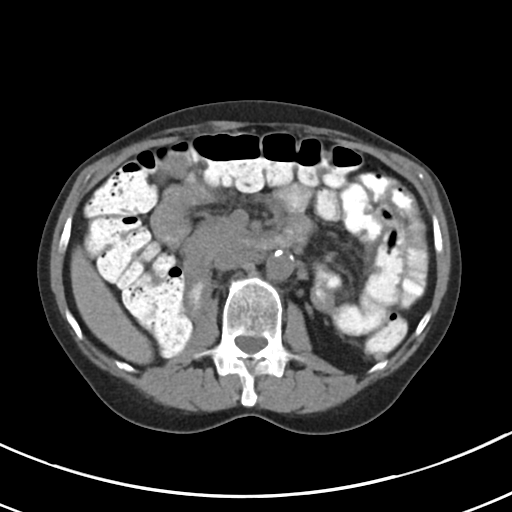
[im 68/85  soft-tissue]
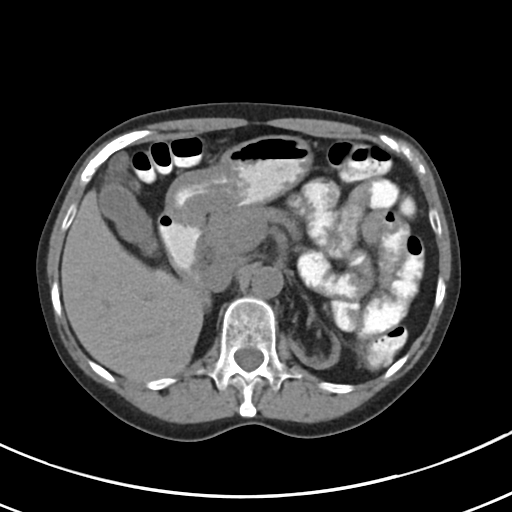
[im 75/85  soft-tissue]
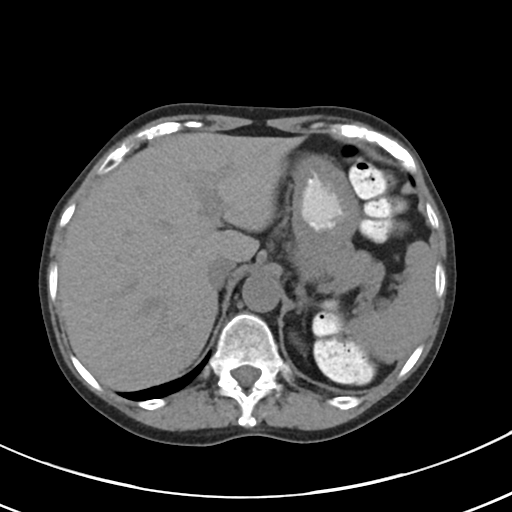
[im 81/85  soft-tissue]
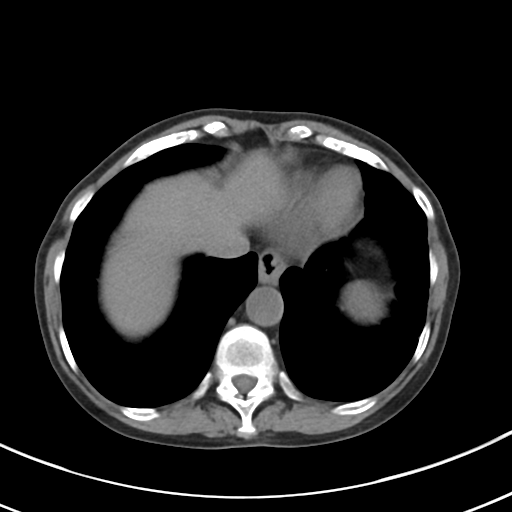

[Series 5: coronal · coronal · 0.70mm/px · 3 of 77 slices shown]
[im 26/77  soft-tissue]
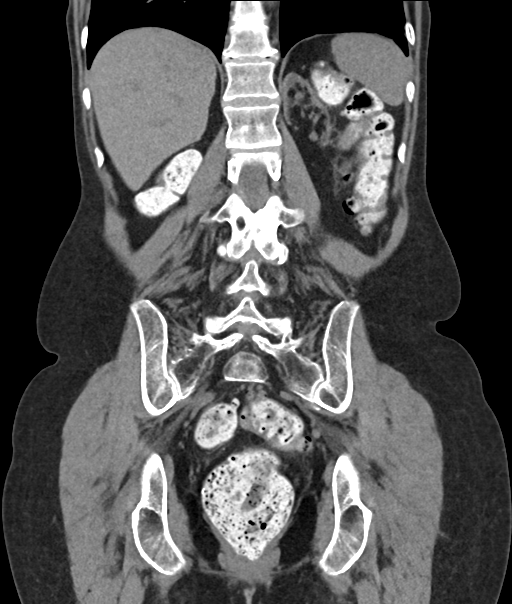
[im 34/77  soft-tissue]
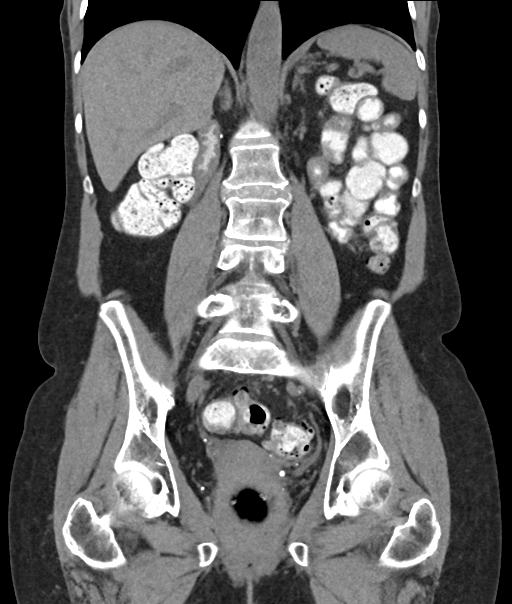
[im 43/77  soft-tissue]
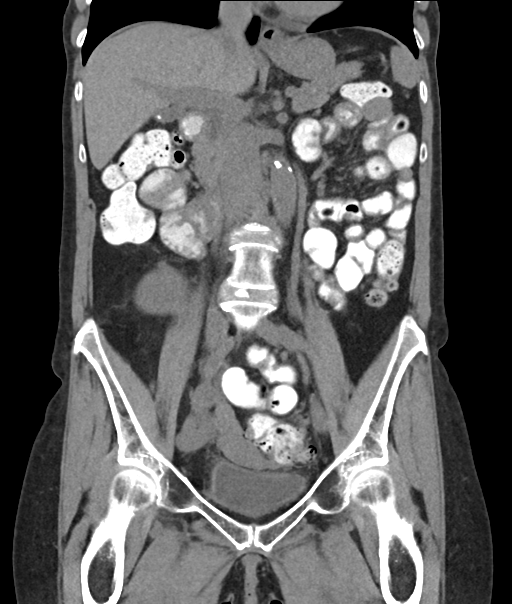

[16 of 46 positions shown; findings below may reference images not displayed]

FINDINGS: Lower chest: No acute findings.

Hepatobiliary: No mass visualized on this unenhanced exam. Tiny less
than 5 mm calcified gallstone again seen, however there is no
evidence of cholecystitis or biliary ductal dilatation.

Pancreas: No mass or inflammatory process visualized on this
unenhanced exam.

Spleen:  Within normal limits in size.

Adrenals/Urinary tract: Prior right nephrectomy again noted. Diffuse
atrophy of the native left kidney is again seen. Two renal
transplants are again seen in the right lower quadrant and right
pelvis, without evidence peritransplant fluid collections or
hydronephrosis. Unremarkable urinary bladder.

Stomach/Bowel: No evidence of obstruction, inflammatory process, or
abnormal fluid collections. Normal appendix visualized.
Diverticulosis is seen mainly involving the descending and sigmoid
colon, however there is no evidence of diverticulitis.

Vascular/Lymphatic: No pathologically enlarged lymph nodes
identified. No evidence of abdominal aortic aneurysm. Aortic
atherosclerotic calcification noted.

Reproductive:  No mass or other significant abnormality.

Other:  None.

Musculoskeletal:  No suspicious bone lesions identified.
IMPRESSION: Colonic diverticulosis, without radiographic evidence of
diverticulitis or other acute findings.

Cholelithiasis. No radiographic evidence of cholecystitis.

## 2023-01-07 ENCOUNTER — Encounter: Payer: Self-pay | Admitting: Internal Medicine

## 2023-01-07 NOTE — Telephone Encounter (Signed)
Telephone call  

## 2023-01-24 ENCOUNTER — Other Ambulatory Visit: Payer: Self-pay | Admitting: Medical Genetics

## 2023-02-17 HISTORY — PX: TRANSURETHRAL RESECTION OF BLADDER TUMOR WITH GYRUS (TURBT-GYRUS): SHX6458

## 2023-03-10 ENCOUNTER — Ambulatory Visit
Admission: RE | Admit: 2023-03-10 | Discharge: 2023-03-10 | Disposition: A | Discharge status: Home / Self Care | Payer: Medicare Other | Source: Ambulatory Visit | Attending: Internal Medicine | Admitting: Internal Medicine

## 2023-03-16 ENCOUNTER — Encounter: Payer: Self-pay | Admitting: Internal Medicine

## 2023-04-05 ENCOUNTER — Ambulatory Visit: Payer: Medicare Other | Admitting: Internal Medicine

## 2023-04-05 ENCOUNTER — Encounter: Payer: Self-pay | Admitting: Internal Medicine

## 2023-04-05 ENCOUNTER — Telehealth: Payer: Self-pay

## 2023-04-05 VITALS — BP 118/64 | HR 72 | Ht 63.0 in | Wt 107.6 lb

## 2023-04-05 DIAGNOSIS — E559 Vitamin D deficiency, unspecified: Secondary | ICD-10-CM

## 2023-04-05 DIAGNOSIS — M810 Age-related osteoporosis without current pathological fracture: Secondary | ICD-10-CM | POA: Diagnosis not present

## 2023-04-05 NOTE — Telephone Encounter (Signed)
Dr. Elvera Lennox, patient will be scheduled as soon as possible.  Auth Submission: NO AUTH NEEDED Site of care: Site of care: [facility] Payer: BCBS medicare Medication & CPT/J Code(s) submitted: Reclast (Zolendronic acid) [id number] Route of submission (phone, fax, portal):  Phone # Fax # Auth type: Buy/Bill PB Units/visits requested: 5mg  x 1 dose Reference number:  Approval from: 04/05/23 to 02/16/24

## 2023-04-05 NOTE — Progress Notes (Signed)
Patient ID: Rhonda [name], female   DOB: [birth year], 68 y.o.   MRN: [id number] HPI  Rhonda [name] is a 68 y.o.-year-old female, initially referred by her PCP, Dr. Allyne Gee, returning for follow-up for  osteoporosis (OP).  She moved from [place] to [place] and then to [place].  Last visit was 1 year ago.  Interim history: No falls or fractures since last visit. No vertigo/vision problems. At last visit, she complained about still losing weight despite trying to eat high-calorie foods like nuts and avocado.  She was also advised to incorporate some meat, but she was not able to do this as she does not like it.  Since last visit she was able to gain 2 pounds  Reviewed history: Patient is seen by Dr. Allyne Gee by nephrology for her history of rend-stage renal disease, status post renal transplant in 2001.  She is on prednisone 4 mg daily, Prograf, Tacrolimus.  Pt was dx with osteoporosis in 2019 on bone density scan.   Pt's previous DXA scans are not available but after last visit we obtained another DXA scan: [facility]23 [facility]) Lumbar spine L1-L4 Femoral neck (FN)  T-score -2.0 RFN: -3.1 LFN: -3.3   07/30/2020 ([place]) Lumbar spine L1-L4 Femoral neck (FN)  T-score  -1.6 RFN: -3.5 LFN: -3.2   Previous OP treatments:  - Fosamax off and on for few years - after 2020 - per nephrologist in [place] (D. Frederick Dewberry - [place], [place]) - Cr increased to 1.5 >> stopped, but retrospectively, this could have been 2/2 Covid vaccine - we tried to start Prolia - but Out-of-pocket cost 255$ so she did not start - Fosamax 70 mg daily  - x1 mo >> developed HAs so she stopped - Reclast  04/08/2021 >> well-tolerated, however, her creatinine increased after the infusion.  It has decreased afterwards. 04/09/2022.  She has a history of vitamin D insufficiency:   Reviewed available vit D levels: 03/04/2023 ([facility]): vitamin D 42.2 Lab Results  Component Value Date   VD25OH  29.42 (L) 04/02/2022   VD25OH 43.7 10/11/2020   VD25OH 23.5 (L) 07/25/2020  09/04/2021: Vitamin D 27 12/18/2019: Vitamin D 27.9 - on MVI  Pt is on Centrum for women containing both calcium and vitamin D.  I advised her to add 2000 units vitamin D daily at last visit, but she forgot.  At last visit I again advised her to add vitamin D-1000 units daily.  She is on a vegetarian diet.  Drinks almond milk.  Eats a lot of tofu, lentils.  She is walking for exercise, and now also doing weightbearing exercises with hand weights.  She does not take high vitamin A doses.  No high dose steroid use.  Menopause was at 39-51 y/o.   FH of osteoporosis: mother, possibly 2 sisters (kyphosis).  No h/o hyper/hypocalcemia or hyperparathyroidism. No h/o kidney stones. 03/04/2023: Calcium 8.9 03/31/2022: Calcium 9.3 09/04/2021: Calcium 8.8 (8.7-10.3) Lab Results  Component Value Date   CALCIUM 9.0 07/25/2020   No h/o thyrotoxicosis. No TSH recent levels:  Lab Results  Component Value Date   TSH 2.798 12/19/2020   She has CKD stage IIIa. Last BUN/Cr: 03/04/2023:  03/31/2022: 33/1.18, GFR 51 09/04/2021 (nephrology): 41/1.37, GFR 43 Lab Results  Component Value Date   BUN 42 (H) 07/25/2020   CREATININE 1.25 (H) 07/25/2020  06/04/2020: Cr 1.42 12/18/2019: BUN/creatinine 41/1.38, GFR 40.  12/18/2019: Alkaline phosphatase was normal  12/2020: SPEP normal  She also has a history of bladder cancer -  had intravesical chemotherapy, no radiation therapy; diverticulitis; HTN, HL. She also has prediabetes: 03/04/2023: HbA1c 5.9% 12/16/2022: HbA1c 5.9% 09/15/2022: HbA1c 5.7% 08/18/2021: HbA1c 5.9% 12/19/2021: HbA1c 6.9% 03/31/2021: HbA1c 6.2% No results found for: "HGBA1C"  ROS:  + See HPI  I reviewed pt's medications, allergies, PMH, social hx, family hx, and changes were documented in the history of present illness. Otherwise, unchanged from my initial visit note.  Past Medical History:  Diagnosis  Date   Diabetes mellitus without complication (HCC)    Hx of bladder cancer    Hyperlipidemia    Hypertension    Kidney transplant recipient     Social History   Socioeconomic History   Marital status: Married    Spouse name: Not on file   Number of children: 1   Years of education: Not on file   Highest education level: Not on file  Occupational History   Not on file  Tobacco Use   Smoking status: Former    Current packs/day: 0.00    Types: Cigarettes    Quit date: 02/17/1983    Years since quitting: 40.1   Smokeless tobacco: Never  Vaping Use   Vaping status: Never Used  Substance and Sexual Activity   Alcohol use: Not Currently   Drug use: Never   Sexual activity: Not on file  Other Topics Concern   Not on file  Social History Narrative   Not on file   Social Drivers of Health   Financial Resource Strain: Low Risk  (12/16/2022)   Received from [facility]   Overall Financial Resource Strain (CARDIA)    Difficulty of Paying Living Expenses: Not hard at all  Food Insecurity: No Food Insecurity (12/16/2022)   Received from [facility]   Hunger Vital Sign    Worried About Running Out of Food in the Last Year: Never true    Ran Out of Food in the Last Year: Never true  Transportation Needs: No Transportation Needs (12/16/2022)   Received from [facility] - Transportation    Lack of Transportation (Medical): No    Lack of Transportation (Non-Medical): No  Physical Activity: Sufficiently Active (12/16/2022)   Received from [facility]   Exercise Vital Sign    Days of Exercise per Week: 5 days    Minutes of Exercise per Session: 30 min  Stress: No Stress Concern Present (12/16/2022)   Received from [facility] - Occupational Stress Questionnaire    Feeling of Stress : Not at all  Social Connections: Socially Integrated (12/16/2022)   Received from [facility]   Social Network    How would you  rate your social network (family, work, friends)?: Good participation with social networks  Intimate Partner Violence: Not At Risk (12/16/2022)   Received from [facility]   HITS    Over the last 12 months how often did your partner physically hurt you?: Never    Over the last 12 months how often did your partner insult you or talk down to you?: Never    Over the last 12 months how often did your partner threaten you with physical harm?: Never    Over the last 12 months how often did your partner scream or curse at you?: Never   Current Outpatient Medications on File Prior to Visit  Medication Sig Dispense Refill   b complex vitamins capsule Take 1 capsule by mouth every other day.     ferrous sulfate  325 (65 FE) MG tablet Take by mouth.     lisinopril (ZESTRIL) 5 MG tablet TAKE 1 TABLET(5 MG) BY MOUTH DAILY     lovastatin (MEVACOR) 20 MG tablet TAKE 1 TABLET(20 MG) BY MOUTH AT BEDTIME     predniSONE (DELTASONE) 1 MG tablet TAKE 4 TABLETS(4 MG) BY MOUTH DAILY     tacrolimus (PROGRAF) 1 MG capsule TAKE 3 CAPSULES BY MOUTH EVERY 12 HOURS     No current facility-administered medications on file prior to visit.   Allergies  Allergen Reactions   Penicillins Rash   Sulfa Antibiotics Rash   Family History  Problem Relation Age of Onset   Heart disease Mother    Diabetes Father    Stroke Father    Heart disease Sister    Breast cancer Sister    Ovarian cancer Sister    Colon cancer Neg Hx    Esophageal cancer Neg Hx    Pancreatic cancer Neg Hx    Stomach cancer Neg Hx     PE: BP 118/64   Pulse 72   Ht 5\' 3"  (1.6 m)   Wt 107 lb 9.6 oz (48.8 kg)   SpO2 98%   BMI 19.06 kg/m  Wt Readings from Last 3 Encounters:  04/05/23 107 lb 9.6 oz (48.8 kg)  04/09/22 105 lb 3.2 oz (47.7 kg)  04/02/22 102 lb 12.8 oz (46.6 kg)   Constitutional: Thin, in NAD. No kyphosis. Eyes: EOMI, no exophthalmos ENT: no thyromegaly, no cervical lymphadenopathy Cardiovascular: RRR, No  MRG Respiratory: CTA B Musculoskeletal: no deformities Skin: no rashes Neurological: no tremor with outstretched hands  Assessment: 1. Osteoporosis  2.  Vitamin D insufficiency  3.  Prediabetes  Plan: 1. Osteoporosis -Likely postmenopausal/age-related, also posttransplant and possibly genetic-she has positive family history of osteoporosis -She had a very low T-score of -3.5 at the level of the right femoral neck in 2022.  Since last visit, we checked another bone density scan 03/16/2023 unfortunately this was done on a different machine so they are not directly comparable.  The T-score decreased at the level of the spine, improved to the regular of the right femoral neck, and was nonsignificantly lower at the left femoral neck. -Of note, we asked for the previous bone density scan reports from Dr. Sonia Side, her previous endocrinologist, but I did not receive them. -We previously had her on Prolia but this was too expensive (co-pay $255).  She requested to go back to Fosamax.  She tried this for 1 month but developed headaches and we had to stop.  At that time, we discussed about options for treatment including obtaining Prolia from the pharmacy and bring it here to the clinic versus Reclast infusions versus osteo anabolic therapy.  She decided to have Reclast infusions-first 04/08/2021 here in clinic.  She had another infusion 04/09/2022 at the outpatient infusion center.  We are infusing these over double time (30 minutes) due to her chronic kidney disease.  We discussed about repeating the infusion this year and then possibly to give her a drug holiday, but depending on the bone density and risk of fractures.  However, the latest hip T-scores are still lower than -3 so we probably need to continue with Reclast for 5 years -At last visit vitamin D level was low so I recommended to add a supplement.  We also discussed about the importance of weightbearing exercises for 30 minutes 5 days a  week.  She was only walking at that time.  Now continues to walk but also does weightbearing exercises with hand weights. -She is aware about fall precautions -She is due for another bone density scan next year - will see pt back in 1 year  2.  Vitamin D insufficiency -At last visit, vitamin D level was still low, at 29.42.  She was taking only a multivitamin.  I recommended to add 1000 units vitamin D daily.  She did so. -Today's visit we reviewed together her latest vitamin D level obtained last month: 42, now normal -Will continue the same dose of vitamin D  3.  Prediabetes -Reviewed latest HbA1c level from last month: 5.9%, stable, still in the prediabetic range. -She previously had an HbA1c of 6.9% in 12/2021, in the diabetic range, but the rest of the levels were in the prediabetic range.  Therefore, she does not have a diagnosis of diabetes. -We discussed previously about working on her diet.  She did have nutrition classes.  I also recommended consistent exercise (weightbearing especially in the setting of osteoporosis).  She is doing a good job with diet and exercise  We can go ahead with the Reclast infusion, but need to infuse this over double time: 30 min.  I also advised her to stay well-hydrated before the infusion.  Carlus Pavlov, MD PhD [facility] Endocrinology

## 2023-04-05 NOTE — Patient Instructions (Addendum)
Please continue Reclast for now.  Will arrange for another infusion - at the Outpatient infusion center.  Please return in 1 year.

## 2023-04-13 ENCOUNTER — Ambulatory Visit (INDEPENDENT_AMBULATORY_CARE_PROVIDER_SITE_OTHER): Payer: Medicare Other | Admitting: *Deleted

## 2023-04-13 VITALS — BP 124/75 | HR 62 | Temp 97.8°F | Resp 16 | Ht 63.0 in | Wt 106.8 lb

## 2023-04-13 DIAGNOSIS — M810 Age-related osteoporosis without current pathological fracture: Secondary | ICD-10-CM

## 2023-04-13 MED ORDER — ACETAMINOPHEN 325 MG PO TABS
650.0000 mg | ORAL_TABLET | Freq: Once | ORAL | Status: AC
  Administered 2023-04-13: 650 mg via ORAL
  Filled 2023-04-13: qty 2

## 2023-04-13 MED ORDER — ZOLEDRONIC ACID 5 MG/100ML IV SOLN
5.0000 mg | Freq: Once | INTRAVENOUS | Status: AC
  Administered 2023-04-13: 5 mg via INTRAVENOUS
  Filled 2023-04-13: qty 100

## 2023-04-13 MED ORDER — DIPHENHYDRAMINE HCL 25 MG PO CAPS
25.0000 mg | ORAL_CAPSULE | Freq: Once | ORAL | Status: AC
  Administered 2023-04-13: 25 mg via ORAL
  Filled 2023-04-13: qty 1

## 2023-04-13 NOTE — Progress Notes (Signed)
 Diagnosis: Osteoporosis  Provider:  Chilton Greathouse MD  Procedure: IV Infusion  IV Type: Peripheral, IV Location: L Antecubital  Reclast (Zolendronic Acid), Dose: 5 mg  Infusion Start Time: 0908 am  Infusion Stop Time: 0941 am  Post Infusion IV Care: Observation period completed and Peripheral IV Discontinued  Discharge: Condition: Good, Destination: Home . AVS Provided  Performed by:  Forrest Moron, RN

## 2023-09-01 ENCOUNTER — Emergency Department (HOSPITAL_COMMUNITY): Admission: EM | Admit: 2023-09-01 | Discharge: 2023-09-01 | Disposition: A | Discharge status: Home / Self Care

## 2023-09-01 ENCOUNTER — Other Ambulatory Visit: Payer: Self-pay

## 2023-09-01 ENCOUNTER — Encounter (HOSPITAL_COMMUNITY): Payer: Self-pay | Admitting: Emergency Medicine

## 2023-09-01 DIAGNOSIS — R109 Unspecified abdominal pain: Secondary | ICD-10-CM | POA: Diagnosis present

## 2023-09-01 DIAGNOSIS — N3091 Cystitis, unspecified with hematuria: Secondary | ICD-10-CM | POA: Insufficient documentation

## 2023-09-01 DIAGNOSIS — N3090 Cystitis, unspecified without hematuria: Secondary | ICD-10-CM

## 2023-09-01 LAB — CBC WITH DIFFERENTIAL/PLATELET
Abs Immature Granulocytes: 0.04 K/uL (ref 0.00–0.07)
Basophils Absolute: 0.1 K/uL (ref 0.0–0.1)
Basophils Relative: 1 %
Eosinophils Absolute: 0.5 K/uL (ref 0.0–0.5)
Eosinophils Relative: 4 %
HCT: 29.6 % — ABNORMAL LOW (ref 36.0–46.0)
Hemoglobin: 9.4 g/dL — ABNORMAL LOW (ref 12.0–15.0)
Immature Granulocytes: 0 %
Lymphocytes Relative: 18 %
Lymphs Abs: 2.1 K/uL (ref 0.7–4.0)
MCH: 32.6 pg (ref 26.0–34.0)
MCHC: 31.8 g/dL (ref 30.0–36.0)
MCV: 102.8 fL — ABNORMAL HIGH (ref 80.0–100.0)
Monocytes Absolute: 1.1 K/uL — ABNORMAL HIGH (ref 0.1–1.0)
Monocytes Relative: 10 %
Neutro Abs: 7.8 K/uL — ABNORMAL HIGH (ref 1.7–7.7)
Neutrophils Relative %: 67 %
Platelets: 201 K/uL (ref 150–400)
RBC: 2.88 MIL/uL — ABNORMAL LOW (ref 3.87–5.11)
RDW: 12.4 % (ref 11.5–15.5)
WBC: 11.5 K/uL — ABNORMAL HIGH (ref 4.0–10.5)
nRBC: 0 % (ref 0.0–0.2)

## 2023-09-01 LAB — URINALYSIS, ROUTINE W REFLEX MICROSCOPIC
Bilirubin Urine: NEGATIVE
Glucose, UA: NEGATIVE mg/dL
Ketones, ur: NEGATIVE mg/dL
Nitrite: NEGATIVE
Protein, ur: 100 mg/dL — AB
RBC / HPF: >50 RBC/hpf (ref 0–5)
Specific Gravity, Urine: 1.014 (ref 1.005–1.030)
WBC, UA: >50 WBC/hpf (ref 0–5)
pH: 6 (ref 5.0–8.0)

## 2023-09-01 LAB — BASIC METABOLIC PANEL WITH GFR
Anion gap: 10 (ref 5–15)
BUN: 29 mg/dL — ABNORMAL HIGH (ref 8–23)
CO2: 23 mmol/L (ref 22–32)
Calcium: 8.5 mg/dL — ABNORMAL LOW (ref 8.9–10.3)
Chloride: 102 mmol/L (ref 98–111)
Creatinine, Ser: 1.04 mg/dL — ABNORMAL HIGH (ref 0.44–1.00)
GFR, Estimated: 59 mL/min — ABNORMAL LOW (ref >60)
Glucose, Bld: 126 mg/dL — ABNORMAL HIGH (ref 70–99)
Potassium: 3.3 mmol/L — ABNORMAL LOW (ref 3.5–5.1)
Sodium: 135 mmol/L (ref 135–145)

## 2023-09-01 MED ORDER — ONDANSETRON 4 MG PO TBDP
4.0000 mg | ORAL_TABLET | Freq: Once | ORAL | Status: AC
Start: 2023-09-01 — End: 2023-09-01
  Administered 2023-09-01: 4 mg via ORAL
  Filled 2023-09-01: qty 1

## 2023-09-01 MED ORDER — POTASSIUM CHLORIDE CRYS ER 20 MEQ PO TBCR
40.0000 meq | EXTENDED_RELEASE_TABLET | Freq: Once | ORAL | Status: AC
  Administered 2023-09-01: 40 meq via ORAL
  Filled 2023-09-01: qty 2

## 2023-09-01 MED ORDER — OXYCODONE-ACETAMINOPHEN 5-325 MG PO TABS
1.0000 | ORAL_TABLET | ORAL | Status: DC | PRN
  Administered 2023-09-01: 1 via ORAL
  Filled 2023-09-01: qty 1

## 2023-09-01 NOTE — ED Notes (Signed)
 Nausea improved.

## 2023-09-01 NOTE — ED Notes (Signed)
 Pt actively vomiting, provided emesis bag

## 2023-09-01 NOTE — ED Triage Notes (Signed)
 Pt in with c/o bladder pain after recent cystoscopy at noon today, states they found new tumors and states that she had some trouble with urinary retention initially afterward but then was able to void on her own. Pt states pain worse with urination, +hematuria

## 2023-09-01 NOTE — ED Provider Notes (Signed)
 [place] EMERGENCY DEPARTMENT AT [facility] Provider Note   CSN: [id number] Arrival date & time: 09/01/23  2012     Patient presents with: Bladder Pain and Recent Procedure   Rhonda [name] is a 68 y.o. female.   This is a 68 year old female presenting emergency department for suprapubic/bladder pain after having cystoscopy done at [facility].  She has been having some hematuria for the past several weeks.  Had cystoscopy done.  After procedure was having suprapubic discomfort.  Reported that she had some urinary retention was unable to urinate at home, but did then was able to urinate here in the emergency department with some improvement of her symptoms.  Was also given Percocet in the waiting room.  She reports no nausea no vomiting.  No fevers.        Prior to Admission medications   Medication Sig Start Date End Date Taking? Authorizing Provider  b complex vitamins capsule Take 1 capsule by mouth every other day.    [provider]  ferrous sulfate 325 (65 FE) MG tablet Take by mouth.    [provider]  lisinopril (ZESTRIL) 5 MG tablet TAKE 1 TABLET(5 MG) BY MOUTH DAILY Patient not taking: Reported on 04/05/2023 06/03/20   [provider]  lovastatin (MEVACOR) 20 MG tablet TAKE 1 TABLET(20 MG) BY MOUTH AT BEDTIME 06/03/20   [provider]  predniSONE (DELTASONE) 1 MG tablet TAKE 4 TABLETS(4 MG) BY MOUTH DAILY 06/03/20   [provider]  tacrolimus (PROGRAF) 1 MG capsule TAKE 3 CAPSULES BY MOUTH EVERY 12 HOURS 03/11/20   [provider]    Allergies: Penicillins and Sulfa antibiotics    Review of Systems  Updated Vital Signs BP 138/83 (BP Location: Right Arm)   Pulse 73   Temp 97.9 F (36.6 C) (Oral)   Resp 16   Wt 48.4 kg   SpO2 97%   BMI 18.90 kg/m   Physical Exam Vitals and nursing note reviewed.  Constitutional:      General: She is not in acute distress.    Appearance: She is  not toxic-appearing.  HENT:     Nose: Nose normal.     Mouth/Throat:     Mouth: Mucous membranes are moist.  Eyes:     Conjunctiva/sclera: Conjunctivae normal.  Cardiovascular:     Rate and Rhythm: Normal rate and regular rhythm.  Pulmonary:     Effort: Pulmonary effort is normal.  Abdominal:     General: Abdomen is flat. There is no distension.     Palpations: Abdomen is soft.     Tenderness: There is abdominal tenderness (mild suprapubic). There is no guarding or rebound.  Skin:    General: Skin is warm.     Capillary Refill: Capillary refill takes less than 2 seconds.  Neurological:     Mental Status: She is alert and oriented to person, place, and time.  Psychiatric:        Mood and Affect: Mood normal.        Behavior: Behavior normal.     (all labs ordered are listed, but only abnormal results are displayed) Labs Reviewed  CBC WITH DIFFERENTIAL/PLATELET - Abnormal; Notable for the following components:      Result Value   WBC 11.5 (*)    RBC 2.88 (*)    Hemoglobin 9.4 (*)    HCT 29.6 (*)    MCV 102.8 (*)    Neutro Abs 7.8 (*)  Monocytes Absolute 1.1 (*)    All other components within normal limits  BASIC METABOLIC PANEL WITH GFR - Abnormal; Notable for the following components:   Potassium 3.3 (*)    Glucose, Bld 126 (*)    BUN 29 (*)    Creatinine, Ser 1.04 (*)    Calcium 8.5 (*)    GFR, Estimated 59 (*)    All other components within normal limits  URINALYSIS, ROUTINE W REFLEX MICROSCOPIC - Abnormal; Notable for the following components:   APPearance CLOUDY (*)    Hgb urine dipstick LARGE (*)    Protein, ur 100 (*)    Leukocytes,Ua MODERATE (*)    Bacteria, UA RARE (*)    All other components within normal limits    EKG: None  Radiology: No results found.   Procedures   Medications Ordered in the ED  oxyCODONE -acetaminophen  (PERCOCET/ROXICET) 5-325 MG per tablet 1 tablet (1 tablet Oral Given 09/01/23 2100)  potassium chloride  SA (KLOR-CON  M)  CR tablet 40 mEq (has no administration in time range)  ondansetron  (ZOFRAN -ODT) disintegrating tablet 4 mg (4 mg Oral Given 09/01/23 2045)    Clinical Course as of 09/01/23 2155  Wed Sep 01, 2023  2142 Per chart review from [facility] Diagnostic Cystoscopy  Performed by: Geofm Almarie Barter, MD Authorized by: Geofm Almarie Barter, MD  Procedure Details  Cystoscope type: flexible Cystoscopy route: transurethral  Cystoscopy location: native bladder  Irrigation used: saline  Position: dorsal lithotomy  Urethra  Urethra: normal   Bladder  Bladder: abnormal  Abnormal bladder findings: bleeding and tumors  Tumor location: right lateral wall, right posterior wall, right dome and right bladder neck  Additional Details 2 cm nodular tumor extending from right bladder neck to right lateral wall right posterior wall and possible small 5 mm right dome satellite lesion. Due to cloudiness was unable to completely visualize ureteral orifice or transplant ureter orifice   [TY]    Clinical Course User Index [TY] Neysa Caron PARAS, DO                                 Medical Decision Making This is a 68 year old female presenting emergency department for suprapubic pain after cystoscopy with [facility].  He is afebrile nontachycardic hemodynamically stable.  Benign abdominal exam with very mild suprapubic discomfort.  No palpable bladder.  Is voiding spontaneously currently.  Labs with mildly low potassium, normal renal function.  Minor leukocytosis and anemia.  Urine with pyuria, but also hematuria.  Not consistent with UTI.  She is feeling improved the symptoms that brought her here have nearly resolved.  Considered CT scan, however reassuring abdominal exam stable vitals and labs.  Low suspicion for acute surgical intra-abdominal pathology.  Amount and/or Complexity of Data Reviewed Independent Historian:     Details: [facility] External Data Reviewed:      Details: See ED course Labs:  Decision-making details documented in ED Course.  Risk Prescription drug management. Decision regarding hospitalization. Diagnosis or treatment significantly limited by social determinants of health.       Final diagnoses:  None    ED Discharge Orders     None          Neysa Caron PARAS, DO 09/01/23 2155

## 2023-09-01 NOTE — Discharge Instructions (Signed)
 May take Tylenol  alternating with ibuprofen for pain control.  Please call your urologist to schedule follow-up as soon as possible.  Return if develop fevers, chills, severe pain, inability to void, lightheadedness, passout or any new or worsening symptoms that are concerning to you.

## 2023-11-11 ENCOUNTER — Other Ambulatory Visit: Payer: Self-pay | Admitting: *Deleted

## 2023-11-11 ENCOUNTER — Inpatient Hospital Stay
Admission: RE | Admit: 2023-11-11 | Discharge: 2023-11-11 | Disposition: A | Discharge status: Home / Self Care | Payer: Self-pay | Source: Ambulatory Visit | Attending: Oncology | Admitting: Oncology

## 2023-11-11 DIAGNOSIS — C679 Malignant neoplasm of bladder, unspecified: Secondary | ICD-10-CM

## 2023-11-24 NOTE — Telephone Encounter (Signed)
 Message routed to Dr. Evern for antibiotic order.

## 2023-11-26 NOTE — Telephone Encounter (Addendum)
 Called the patient and relayed the information per nathan. Patient stated understanding.        ----- Message from Rankin Spates, PA-C sent at 11/26/2023 10:05 AM EDT ----- Regarding: RE: UC results Antibiotic has been ordered and sent to patient's pharmacy.  Crystal also asked me about this patient.  Will include her and message so she is only notified once ----- Message ----- From: Avelina DELENA Sharps, RN Sent: 11/26/2023   8:55 AM EDT To: Rankin Ozell Spates, PA-C Subject: UC results                                     Urine Culture  >100,000 CFU/mL Escherichia coli Abnormal    Resulting Agency: [facility]  Susceptibility    Escherichia coli   MIC   Amoxicillin + Clavulanate <=8/4 ug/ml Susceptible   Ampicillin >16 ug/ml Resistant   Ampicillin + Sulbactam 8 ug/ml Susceptible   Cefazolin <=2 ug/ml Susceptible   Cefotaxime <=2 ug/ml Susceptible   Ceftriaxone <=1 ug/ml Susceptible   Cefuroxime <=4 ug/ml Susceptible   Ciprofloxacin >2 ug/ml Resistant   Ertapenem <=0.5 ug/ml Susceptible   Gentamicin <=2 ug/ml Susceptible   Meropenem <=1 ug/ml Susceptible   Nitrofurantoin <=32 ug/ml Susceptible   Piperacillin + Tazobactam <=8 ug/ml Susceptible   Tetracycline <=4 ug/ml Susceptible   Trimethoprim + Sulfamethoxazole <=0.5/9.5 u... Susceptible          Specimen Collected: 11/22/23 09:04 EDT Last Resulted: 11/24/23 07:52 EDT  Sent this to Dr. Evern yesterday but he never responded. Since we are going into the weekend can you advise on an antibiotic please? Thank you, Patty

## 2023-11-29 ENCOUNTER — Encounter: Payer: Self-pay | Admitting: Oncology

## 2023-11-29 ENCOUNTER — Inpatient Hospital Stay: Attending: Oncology | Admitting: Oncology

## 2023-11-29 ENCOUNTER — Encounter: Payer: Self-pay | Admitting: *Deleted

## 2023-11-29 ENCOUNTER — Other Ambulatory Visit: Payer: Self-pay | Admitting: *Deleted

## 2023-11-29 VITALS — BP 116/70 | HR 82 | Temp 97.9°F | Resp 18 | Ht 63.0 in | Wt 101.5 lb

## 2023-11-29 DIAGNOSIS — M810 Age-related osteoporosis without current pathological fracture: Secondary | ICD-10-CM

## 2023-11-29 DIAGNOSIS — C679 Malignant neoplasm of bladder, unspecified: Secondary | ICD-10-CM

## 2023-11-29 DIAGNOSIS — C678 Malignant neoplasm of overlapping sites of bladder: Secondary | ICD-10-CM

## 2023-11-29 DIAGNOSIS — Z8551 Personal history of malignant neoplasm of bladder: Secondary | ICD-10-CM

## 2023-11-29 DIAGNOSIS — Z8639 Personal history of other endocrine, nutritional and metabolic disease: Secondary | ICD-10-CM | POA: Diagnosis not present

## 2023-11-29 DIAGNOSIS — I10 Essential (primary) hypertension: Secondary | ICD-10-CM | POA: Diagnosis not present

## 2023-11-29 DIAGNOSIS — Z940 Kidney transplant status: Secondary | ICD-10-CM

## 2023-11-29 NOTE — Progress Notes (Signed)
 PATIENT NAVIGATOR PROGRESS NOTE  Name: Rhonda [name] Date: 11/29/2023 MRN: [id number]  DOB: 01-Oct-1955   Reason for visit:  New patient appt  Comments:  Met with [name] after their appt with Dr Cloretta  Urgent referral to Dr Selma Urgent referral to Dr Patrcia at Rad[facility] Will return to see Dr Cloretta on 10/31  Time spent counseling/coordinating care: > 60 minutes

## 2023-11-29 NOTE — Progress Notes (Signed)
 [facility] New Patient Consult   Requesting MD: Gladystine Erminio CROME, Md [address],  [place] [address]   Rhonda [name] 68 y.o.  1955-12-27    Reason for Consult: Bladder cancer   HPI: Rhonda [name] has a complex medical history including a history of urothelial carcinoma, renal transplant, and interstitial cystitis.  She was seen by urology at [facility] on 08/12/2023 due to dysuria.  She underwent a cystoscopy and transurethral resection of a bladder tumor on 09/21/2023.  A 3 cm area of nodular tissue was noted at the bladder neck, a 5 cm nodular tumor was noted at the right posterior wall extending toward the bladder neck, and there was a 1 cm papillary carpeting at the left posterior wall.  The transplant ureteral orifice at the right bladder dome was uninvolved by tumor.  The pathology revealed invasive high-grade urothelial carcinoma with tumor invading the muscularis propria.  Rhonda [name] was admitted 09/25/2023 with fever.  She was found to have right hydronephrosis and E. coli bacteremia.  A percutaneous nephrostomy tube was placed.  She was discharged to home 09/29/2023.  Staging CTs 09/29/2023 revealed no evidence of metastatic disease.  She was evaluated by Dr. Candida 10/07/2023.  She was evaluated by radiation oncology.  She is felt to not be a candidate for radiation due to the location of the transplanted kidney.  She is not a candidate for neoadjuvant cisplatin based chemotherapy or immunotherapy due to renal insufficiency and immunosuppressive therapy.  She is scheduled for a cystectomy in November.  She is seen today for a second medical oncology opinion.  Rhonda [name] reports recent urinary urinary incontinence and a decreased stream.  She has a history of hematuria beginning in September of this year.  Past Medical History:  Diagnosis Date   Diabetes mellitus without complication (HCC)    Hx of bladder cancer    Hyperlipidemia     Hypertension    Kidney transplant recipient     .  High-grade urothelial carcinoma of the distal ureter, status post a partial cystectomy and nephrectomy 2019   .  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  June 2019-noninvasive bladder cancer   .  August 2019-urothelial tumor involving the distal right ureter invading the bladder with severe right hydronephrosis, right laparoscopic nephro ureterectomy 12/02/2017,pT2,pnX high-grade urothelial carcinoma of the distal right ureter, R0 resection   .  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  She is retired from Clinical biochemist.  She quit cigarettes in 1985.  She does not use alcohol.  No transfusion history.  No risk factor for HIV or hepatitis.  ROS:   Positives include: Night sweats a few times per month, decreased urinary stream, hematuria, and incontinence.,  Migrating pain at the back and left abdomen  A complete ROS was otherwise negative.  Physical Exam:  Blood pressure 116/70, pulse 82, temperature 97.9  F (36.6 C), temperature source Temporal, resp. rate 18, height 5' 3 (1.6 m), weight 101 lb 8 oz (46 kg), SpO2 100%.  HEENT: Oropharynx without visible mass, neck without mass Lungs: Clear bilaterally Cardiac: Regular rate and rhythm Abdomen: No hepatosplenomegaly, right low abdomen PCN site with a gauze dressing  Vascular: No leg edema Lymph nodes: No cervical,  supraclavicular, axillary, or inguinal nodes Neurologic: Alert and oriented, the motor exam appears intact in the upper and lower extremities bilaterally Skin: No rash Musculoskeletal: No spine tenderness   LAB:  CBC  Lab Results  Component Value Date   WBC 11.5 (H) 09/01/2023   HGB 9.4 (L) 09/01/2023   HCT 29.6 (L) 09/01/2023   MCV 102.8 (H) 09/01/2023   PLT 201 09/01/2023   NEUTROABS 7.8 (H) 09/01/2023        CMP  Lab Results  Component Value Date   NA 135 09/01/2023   K 3.3 (L) 09/01/2023   CL 102 09/01/2023   CO2 23 09/01/2023   GLUCOSE 126 (H) 09/01/2023   BUN 29 (H) 09/01/2023   CREATININE 1.04 (H) 09/01/2023   CALCIUM 8.5 (L) 09/01/2023   GFRNONAA 59 (L) 09/01/2023     No results found for: CEA1, CAN199, CA125  Imaging:  No results found.    Assessment/Plan:   Muscle-invasive bladder cancer 09/01/2023 cystoscopy: 2 cm nodular tumor at the right bladder neck extending to the right lateral wall and right posterior wall, possible 5 mm right dome satellite lesion 09/01/2023: CT abdomen/pelvis-indeterminate 2 cm area of soft tissue nodularity at the central base of the urinary bladder 09/21/2023: TURBT-muscle-invasive bladder cancer, high-grade involving bladder neck and right posterior wall 09/29/2023 CTs: Suspicious heterogenous enhancement of the bladder wall and nodular appearance of the left bladder base, indeterminate right breast nodule, History of macrocytic anemia Hypertension Osteoporosis Hyperlipidemia Admission with E. coli sepsis August 2025 Right hydronephrosis August 2025, status post placement of a percutaneous nephrostomy tube Renal insufficiency, status post renal transplant in 2001 secondary to reflux nephropathy 2008: High-grade nonmuscle invasive bladder cancer, induction intravesicular [facility] and surveillance cystoscopy without recurrence June 2019: T1 high-grade nonmuscle invasive bladder cancer August 2019 CT urogram-urothelial  tumor involving the distal ureter invading the bladder with severe right hydronephrosis 12/02/2017: Right nephro ureterectomy and resection of right ovarian cyst, 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  2020 recurrence of bladder abnormality at the trigone, resected-nephrogenic adenoma 13.  Indeterminate right breast nodule on CT 09/29/2023   Disposition:   Rhonda [name] has a complex medical history including a remote renal transplant for treatment of reflux nephropathy.  She is maintained on an immunosuppressive regimen and has chronic renal insufficiency. She has a history of urothelial carcinoma including invasive carcinoma of the distal right ureter treated with a right nephro ureterectomy and partial cystectomy in 2019.  She has a history of noninvasive bladder cancer.  She has now been diagnosed with muscle invasive bladder cancer.  There is no radiologic evidence of metastatic disease.  We discussed standard treatment of invasive bladder cancer with neoadjuvant chemotherapy/immunotherapy followed by surgery.  She is not a candidate for cisplatin based chemotherapy or immunotherapy due to renal insufficiency and immunosuppressive therapy.  There is no clear role for neoadjuvant carboplatin based chemotherapy.  We discussed the possibility of definitive chemotherapy/radiation, but she has been evaluated by radiation oncology and is felt to not be a candidate of radiation due to the proximity of the transplant kidney  to the bladder.  She is scheduled for a radical cystectomy 01/11/2024.  Rhonda [name] would like a multidisciplinary opinion in [place].  We will make a referral to radiation oncology and urology.  She reports evaluation of the indeterminate right breast nodule has been scheduled at [facility].  She will return for an office visit and further discussion 12/17/2023.  Arley Hof, MD  11/29/2023,  4:09 PM

## 2023-12-02 ENCOUNTER — Telehealth: Payer: Self-pay | Admitting: *Deleted

## 2023-12-02 ENCOUNTER — Encounter: Payer: Self-pay | Admitting: *Deleted

## 2023-12-02 NOTE — Telephone Encounter (Signed)
 Patient left VM that she is scheduled with Dr. Patrcia on 10/31 at 12:00 that will conflict with her appointment with Dr. Cloretta that day. Asking to get this rescheduled and she will see this on MyChart. Appointment changed to 11/04 at 3:10 pm

## 2023-12-02 NOTE — Progress Notes (Signed)
 PATIENT NAVIGATOR PROGRESS NOTE  Name: Rhonda [name] Date: 12/02/2023 MRN: [id number]  DOB: 02-24-1955   Reason for visit:  Telephone call and update of plan  Comments:  Called and spoke with [name] and reviewed plan 10/28 Her case will be presented at [facility] 10/29 Appt with Dr Selma 10/31 Appt with Dr Patrcia 10/31 Appt with Dr Cloretta   She verbalized understanding and appreciation of coordinated care     Time spent counseling/coordinating care: 30-45 minutes

## 2023-12-10 NOTE — Progress Notes (Incomplete)
 GU Location of Tumor / Histology: Urinary Bladder  Rhonda [name] presented as referral from Dr. Arley Hof [facility])  Past/Anticipated interventions by urology, if any:  Dr. Molly Reissmann    Past/Anticipated interventions by medical oncology, if any:  Dr. Arley Hof    Weight changes, if any: {:18581}  Bowel/Bladder complaints, if any: {:18581}   Nausea/Vomiting, if any: {:18581}  Pain issues, if any:  {:18581}  SAFETY ISSUES: Prior radiation? {:18581} Pacemaker/ICD? {:18581} Possible current pregnancy? Postmenopausal Is the patient on methotrexate? No  Current Complaints / other details:    30 minutes spent total, including time for meaningful use questions, reviewing medication, as well as spent in face-to-face time in nurse evaluation with the patient.

## 2023-12-13 ENCOUNTER — Telehealth: Payer: Self-pay | Admitting: *Deleted

## 2023-12-13 ENCOUNTER — Encounter: Payer: Self-pay | Admitting: *Deleted

## 2023-12-13 ENCOUNTER — Encounter: Payer: Self-pay | Admitting: Internal Medicine

## 2023-12-13 NOTE — Progress Notes (Signed)
 Per Vertell at [facility], patient's pathology slides will be sent out today.

## 2023-12-13 NOTE — Progress Notes (Signed)
 Path request sent to [facility]) for accession number [id number].

## 2023-12-13 NOTE — Telephone Encounter (Signed)
 Ms. Emami left message requesting to speak with navigator regarding appointment with Dr. Selma. Should she keep it or cancel?

## 2023-12-13 NOTE — Progress Notes (Signed)
 Appointment with Dr Selma canceled and pt now scheduled with Dr Alvaro on 12/14/23. Her case will be presented at Urology conference 10/28 and she has appt with Dr Patrcia on 10/31.  Called Ms Moga and reviewed appts and plan

## 2023-12-15 ENCOUNTER — Telehealth: Payer: Self-pay | Admitting: Radiation Oncology

## 2023-12-15 NOTE — Telephone Encounter (Signed)
 Returned engineer, technical sales from patient. Patient cancelled upcoming consult with Dr. Patrcia and stated she no longer needed a second opinion because she was not going to proceed with Radiation. Patient aware of closed referral.

## 2023-12-16 ENCOUNTER — Other Ambulatory Visit: Payer: Self-pay | Admitting: Medical Genetics

## 2023-12-16 DIAGNOSIS — Z006 Encounter for examination for normal comparison and control in clinical research program: Secondary | ICD-10-CM

## 2023-12-17 ENCOUNTER — Inpatient Hospital Stay: Admitting: Oncology

## 2023-12-17 ENCOUNTER — Ambulatory Visit: Admitting: Radiation Oncology

## 2023-12-17 ENCOUNTER — Ambulatory Visit

## 2023-12-21 ENCOUNTER — Inpatient Hospital Stay: Attending: Oncology | Admitting: Oncology

## 2023-12-21 VITALS — BP 97/63 | HR 98 | Temp 98.0°F | Resp 18 | Ht 63.0 in | Wt 106.0 lb

## 2023-12-21 DIAGNOSIS — C679 Malignant neoplasm of bladder, unspecified: Secondary | ICD-10-CM

## 2023-12-21 DIAGNOSIS — M810 Age-related osteoporosis without current pathological fracture: Secondary | ICD-10-CM | POA: Insufficient documentation

## 2023-12-21 DIAGNOSIS — I10 Essential (primary) hypertension: Secondary | ICD-10-CM | POA: Insufficient documentation

## 2023-12-21 DIAGNOSIS — Z940 Kidney transplant status: Secondary | ICD-10-CM | POA: Insufficient documentation

## 2023-12-21 DIAGNOSIS — N1330 Unspecified hydronephrosis: Secondary | ICD-10-CM | POA: Insufficient documentation

## 2023-12-21 DIAGNOSIS — C675 Malignant neoplasm of bladder neck: Secondary | ICD-10-CM | POA: Insufficient documentation

## 2023-12-21 DIAGNOSIS — N6310 Unspecified lump in the right breast, unspecified quadrant: Secondary | ICD-10-CM | POA: Insufficient documentation

## 2023-12-21 DIAGNOSIS — N289 Disorder of kidney and ureter, unspecified: Secondary | ICD-10-CM | POA: Insufficient documentation

## 2023-12-21 NOTE — Progress Notes (Signed)
  [facility] OFFICE PROGRESS NOTE   Diagnosis: Bladder cancer  INTERVAL HISTORY:   [name] returns as scheduled.  A nephrostomy remains in place.  She complains of burning with urination.  She saw Dr. Lorn and reports he recommends a cystectomy and removal of the transplant kidney.  She is scheduled for an office visit with Dr. Evern 12/23/2023 and a cystectomy on 01/11/2024.  Objective:  Vital signs in last 24 hours:  Blood pressure 97/63, pulse 98, temperature 98 F (36.7 C), temperature source Temporal, resp. rate 18, height 5' 3 (1.6 m), weight 106 lb (48.1 kg), SpO2 100%.   Lymphatics: No cervical, supraclavicular, axillary, or inguinal nodes Resp: Lungs clear bilaterally Cardio: Regular rate and rhythm GI: No hepatosplenomegaly, right lower quadrant nephrostomy tube Vascular: No leg edema  Skin: 4-5 mm mobile oval/round cutaneous lesions at the right forearm-cysts versus lipomas   Lab Results:  Lab Results  Component Value Date   WBC 11.5 (H) 09/01/2023   HGB 9.4 (L) 09/01/2023   HCT 29.6 (L) 09/01/2023   MCV 102.8 (H) 09/01/2023   PLT 201 09/01/2023   NEUTROABS 7.8 (H) 09/01/2023    CMP  Lab Results  Component Value Date   NA 135 09/01/2023   K 3.3 (L) 09/01/2023   CL 102 09/01/2023   CO2 23 09/01/2023   GLUCOSE 126 (H) 09/01/2023   BUN 29 (H) 09/01/2023   CREATININE 1.04 (H) 09/01/2023   CALCIUM 8.5 (L) 09/01/2023   GFRNONAA 59 (L) 09/01/2023    Medications: I have reviewed the patient's current medications.   Assessment/Plan: Muscle-invasive bladder cancer 09/01/2023 cystoscopy: 2 cm nodular tumor at the right bladder neck extending to the right lateral wall and right posterior wall, possible 5 mm right dome satellite lesion 09/01/2023: CT abdomen/pelvis-indeterminate 2 cm area of soft tissue nodularity at the central base of the urinary bladder 09/21/2023: TURBT-muscle-invasive bladder cancer, high-grade involving bladder neck  and right posterior wall 09/29/2023 CTs: Suspicious heterogenous enhancement of the bladder wall and nodular appearance of the left bladder base, indeterminate right breast nodule, History of macrocytic anemia Hypertension Osteoporosis Hyperlipidemia Admission with E. coli sepsis August 2025 Right hydronephrosis August 2025, status post placement of a percutaneous nephrostomy tube Renal insufficiency, status post renal transplant in 2001 secondary to reflux nephropathy 2008: High-grade nonmuscle invasive bladder cancer, induction intravesicular [facility] and surveillance cystoscopy without recurrence June 2019: T1 high-grade nonmuscle invasive bladder cancer August 2019 CT urogram-urothelial tumor involving the distal ureter invading the bladder with severe right hydronephrosis 12/02/2017: Right nephro ureterectomy and resection of right ovarian cyst, 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  2020 recurrence of bladder abnormality at the trigone, resected-nephrogenic adenoma 13.  Indeterminate right breast nodule on CT 09/29/2023     Disposition: Rhonda [name] has been diagnosed with muscle invasive bladder cancer in the setting of a transplant kidney.  She is not a candidate for neoadjuvant therapy or radiation.  Will follow-up on notes from Dr. Alvaro and the 12/14/2023 urology conference discussion.  She plans to proceed with the scheduled cystectomy 01/11/2024.  [name] will return for an office visit here during the week of 01/31/2024.  We we will review the operative findings, pathology report, and discuss the indication for adjuvant systemic therapy.  Arley Hof, MD  12/21/2023  4:12 PM

## 2023-12-22 ENCOUNTER — Emergency Department (HOSPITAL_COMMUNITY)

## 2023-12-22 ENCOUNTER — Inpatient Hospital Stay (HOSPITAL_COMMUNITY)
Admission: EM | Admit: 2023-12-22 | Discharge: 2024-01-04 | DRG: 698 | Disposition: A | Discharge status: Home / Self Care | Attending: Internal Medicine | Admitting: Internal Medicine

## 2023-12-22 DIAGNOSIS — E43 Unspecified severe protein-calorie malnutrition: Secondary | ICD-10-CM | POA: Diagnosis present

## 2023-12-22 DIAGNOSIS — J9601 Acute respiratory failure with hypoxia: Secondary | ICD-10-CM

## 2023-12-22 DIAGNOSIS — Z882 Allergy status to sulfonamides status: Secondary | ICD-10-CM

## 2023-12-22 DIAGNOSIS — E876 Hypokalemia: Secondary | ICD-10-CM | POA: Diagnosis not present

## 2023-12-22 DIAGNOSIS — D696 Thrombocytopenia, unspecified: Secondary | ICD-10-CM | POA: Diagnosis not present

## 2023-12-22 DIAGNOSIS — Z7952 Long term (current) use of systemic steroids: Secondary | ICD-10-CM

## 2023-12-22 DIAGNOSIS — E1165 Type 2 diabetes mellitus with hyperglycemia: Secondary | ICD-10-CM | POA: Diagnosis not present

## 2023-12-22 DIAGNOSIS — E11649 Type 2 diabetes mellitus with hypoglycemia without coma: Secondary | ICD-10-CM | POA: Diagnosis present

## 2023-12-22 DIAGNOSIS — E785 Hyperlipidemia, unspecified: Secondary | ICD-10-CM | POA: Diagnosis present

## 2023-12-22 DIAGNOSIS — A4152 Sepsis due to Pseudomonas: Secondary | ICD-10-CM | POA: Diagnosis present

## 2023-12-22 DIAGNOSIS — Y838 Other surgical procedures as the cause of abnormal reaction of the patient, or of later complication, without mention of misadventure at the time of the procedure: Secondary | ICD-10-CM | POA: Diagnosis present

## 2023-12-22 DIAGNOSIS — N183 Chronic kidney disease, stage 3 unspecified: Secondary | ICD-10-CM | POA: Diagnosis present

## 2023-12-22 DIAGNOSIS — N179 Acute kidney failure, unspecified: Secondary | ICD-10-CM | POA: Diagnosis not present

## 2023-12-22 DIAGNOSIS — D72829 Elevated white blood cell count, unspecified: Secondary | ICD-10-CM

## 2023-12-22 DIAGNOSIS — K567 Ileus, unspecified: Secondary | ICD-10-CM | POA: Diagnosis present

## 2023-12-22 DIAGNOSIS — T8619 Other complication of kidney transplant: Secondary | ICD-10-CM | POA: Diagnosis present

## 2023-12-22 DIAGNOSIS — R6521 Severe sepsis with septic shock: Secondary | ICD-10-CM | POA: Diagnosis present

## 2023-12-22 DIAGNOSIS — C679 Malignant neoplasm of bladder, unspecified: Secondary | ICD-10-CM | POA: Diagnosis present

## 2023-12-22 DIAGNOSIS — I6381 Other cerebral infarction due to occlusion or stenosis of small artery: Secondary | ICD-10-CM | POA: Diagnosis present

## 2023-12-22 DIAGNOSIS — Z7982 Long term (current) use of aspirin: Secondary | ICD-10-CM

## 2023-12-22 DIAGNOSIS — A419 Sepsis, unspecified organism: Secondary | ICD-10-CM | POA: Diagnosis not present

## 2023-12-22 DIAGNOSIS — F05 Delirium due to known physiological condition: Secondary | ICD-10-CM | POA: Diagnosis not present

## 2023-12-22 DIAGNOSIS — G9341 Metabolic encephalopathy: Secondary | ICD-10-CM | POA: Diagnosis present

## 2023-12-22 DIAGNOSIS — Z79899 Other long term (current) drug therapy: Secondary | ICD-10-CM

## 2023-12-22 DIAGNOSIS — N390 Urinary tract infection, site not specified: Secondary | ICD-10-CM | POA: Diagnosis present

## 2023-12-22 DIAGNOSIS — R7401 Elevation of levels of liver transaminase levels: Secondary | ICD-10-CM

## 2023-12-22 DIAGNOSIS — E860 Dehydration: Secondary | ICD-10-CM | POA: Diagnosis present

## 2023-12-22 DIAGNOSIS — Z823 Family history of stroke: Secondary | ICD-10-CM

## 2023-12-22 DIAGNOSIS — Z1623 Resistance to quinolones and fluoroquinolones: Secondary | ICD-10-CM | POA: Diagnosis present

## 2023-12-22 DIAGNOSIS — R569 Unspecified convulsions: Secondary | ICD-10-CM

## 2023-12-22 DIAGNOSIS — E872 Acidosis, unspecified: Secondary | ICD-10-CM | POA: Diagnosis present

## 2023-12-22 DIAGNOSIS — Z79621 Long term (current) use of calcineurin inhibitor: Secondary | ICD-10-CM

## 2023-12-22 DIAGNOSIS — E870 Hyperosmolality and hypernatremia: Secondary | ICD-10-CM | POA: Diagnosis not present

## 2023-12-22 DIAGNOSIS — G40901 Epilepsy, unspecified, not intractable, with status epilepticus: Secondary | ICD-10-CM | POA: Diagnosis not present

## 2023-12-22 DIAGNOSIS — Z833 Family history of diabetes mellitus: Secondary | ICD-10-CM

## 2023-12-22 DIAGNOSIS — J80 Acute respiratory distress syndrome: Secondary | ICD-10-CM | POA: Diagnosis present

## 2023-12-22 DIAGNOSIS — E162 Hypoglycemia, unspecified: Secondary | ICD-10-CM

## 2023-12-22 DIAGNOSIS — Z8249 Family history of ischemic heart disease and other diseases of the circulatory system: Secondary | ICD-10-CM

## 2023-12-22 DIAGNOSIS — I129 Hypertensive chronic kidney disease with stage 1 through stage 4 chronic kidney disease, or unspecified chronic kidney disease: Secondary | ICD-10-CM | POA: Diagnosis present

## 2023-12-22 DIAGNOSIS — Z681 Body mass index (BMI) 19 or less, adult: Secondary | ICD-10-CM

## 2023-12-22 DIAGNOSIS — T361X5A Adverse effect of cephalosporins and other beta-lactam antibiotics, initial encounter: Secondary | ICD-10-CM | POA: Diagnosis not present

## 2023-12-22 DIAGNOSIS — Z87891 Personal history of nicotine dependence: Secondary | ICD-10-CM

## 2023-12-22 DIAGNOSIS — T83512A Infection and inflammatory reaction due to nephrostomy catheter, initial encounter: Principal | ICD-10-CM | POA: Diagnosis present

## 2023-12-22 DIAGNOSIS — Z880 Allergy status to penicillin: Secondary | ICD-10-CM

## 2023-12-22 DIAGNOSIS — K7200 Acute and subacute hepatic failure without coma: Secondary | ICD-10-CM | POA: Diagnosis present

## 2023-12-22 DIAGNOSIS — E1122 Type 2 diabetes mellitus with diabetic chronic kidney disease: Secondary | ICD-10-CM | POA: Diagnosis present

## 2023-12-22 HISTORY — DX: Age-related osteoporosis without current pathological fracture: M81.0

## 2023-12-22 HISTORY — DX: Iron deficiency anemia, unspecified: D50.9

## 2023-12-22 HISTORY — DX: Chronic kidney disease, stage 3 unspecified: N18.30

## 2023-12-22 HISTORY — DX: Type 2 diabetes mellitus without complications: E11.9

## 2023-12-22 HISTORY — DX: Malignant neoplasm of urinary organ, unspecified: C68.9

## 2023-12-22 HISTORY — DX: Other specified postprocedural states: Z98.890

## 2023-12-22 LAB — CBC WITH DIFFERENTIAL/PLATELET
Abs Immature Granulocytes: 0.31 K/uL — ABNORMAL HIGH (ref 0.00–0.07)
Basophils Absolute: 0.1 K/uL (ref 0.0–0.1)
Basophils Relative: 0 %
Eosinophils Absolute: 0 K/uL (ref 0.0–0.5)
Eosinophils Relative: 0 %
HCT: 37.2 % (ref 36.0–46.0)
Hemoglobin: 11.4 g/dL — ABNORMAL LOW (ref 12.0–15.0)
Immature Granulocytes: 2 %
Lymphocytes Relative: 9 %
Lymphs Abs: 1.5 K/uL (ref 0.7–4.0)
MCH: 31.1 pg (ref 26.0–34.0)
MCHC: 30.6 g/dL (ref 30.0–36.0)
MCV: 101.6 fL — ABNORMAL HIGH (ref 80.0–100.0)
Monocytes Absolute: 0.8 K/uL (ref 0.1–1.0)
Monocytes Relative: 5 %
Neutro Abs: 13.4 K/uL — ABNORMAL HIGH (ref 1.7–7.7)
Neutrophils Relative %: 84 %
Platelets: 191 K/uL (ref 150–400)
RBC: 3.66 MIL/uL — ABNORMAL LOW (ref 3.87–5.11)
RDW: 13.9 % (ref 11.5–15.5)
Smear Review: NORMAL
WBC: 16 K/uL — ABNORMAL HIGH (ref 4.0–10.5)
nRBC: 0 % (ref 0.0–0.2)

## 2023-12-22 LAB — URINALYSIS, ROUTINE W REFLEX MICROSCOPIC
Bilirubin Urine: NEGATIVE
Glucose, UA: 50 mg/dL — AB
Ketones, ur: NEGATIVE mg/dL
Nitrite: POSITIVE — AB
Protein, ur: >=300 mg/dL — AB
Specific Gravity, Urine: 1.022 (ref 1.005–1.030)
pH: 7 (ref 5.0–8.0)

## 2023-12-22 LAB — RESP PANEL BY RT-PCR (RSV, FLU A&B, COVID)  RVPGX2
Influenza A by PCR: NEGATIVE
Influenza B by PCR: NEGATIVE
Resp Syncytial Virus by PCR: NEGATIVE
SARS Coronavirus 2 by RT PCR: NEGATIVE

## 2023-12-22 LAB — CBG MONITORING, ED: Glucose-Capillary: 88 mg/dL (ref 70–99)

## 2023-12-22 LAB — COMPREHENSIVE METABOLIC PANEL WITH GFR
ALT: 36 U/L (ref 0–44)
AST: 110 U/L — ABNORMAL HIGH (ref 15–41)
Albumin: 2.7 g/dL — ABNORMAL LOW (ref 3.5–5.0)
Alkaline Phosphatase: 40 U/L (ref 38–126)
Anion gap: 26 — ABNORMAL HIGH (ref 5–15)
BUN: 57 mg/dL — ABNORMAL HIGH (ref 8–23)
CO2: 11 mmol/L — ABNORMAL LOW (ref 22–32)
Calcium: 8.1 mg/dL — ABNORMAL LOW (ref 8.9–10.3)
Chloride: 102 mmol/L (ref 98–111)
Creatinine, Ser: 4.28 mg/dL — ABNORMAL HIGH (ref 0.44–1.00)
GFR, Estimated: 11 mL/min — ABNORMAL LOW (ref >60)
Glucose, Bld: 57 mg/dL — ABNORMAL LOW (ref 70–99)
Potassium: 4.2 mmol/L (ref 3.5–5.1)
Sodium: 139 mmol/L (ref 135–145)
Total Bilirubin: 0.6 mg/dL (ref 0.0–1.2)
Total Protein: 5.6 g/dL — ABNORMAL LOW (ref 6.5–8.1)

## 2023-12-22 LAB — PROTIME-INR
INR: 1.4 — ABNORMAL HIGH (ref 0.8–1.2)
Prothrombin Time: 18.3 s — ABNORMAL HIGH (ref 11.4–15.2)

## 2023-12-22 LAB — GLUCOSE, CAPILLARY: Glucose-Capillary: 93 mg/dL (ref 70–99)

## 2023-12-22 LAB — I-STAT CG4 LACTIC ACID, ED: Lactic Acid, Venous: 11.6 mmol/L (ref 0.5–1.9)

## 2023-12-22 LAB — CG4 I-STAT (LACTIC ACID): Lactic Acid, Venous: 5.8 mmol/L (ref 0.5–1.9)

## 2023-12-22 LAB — AMMONIA: Ammonia: 35 umol/L (ref 9–35)

## 2023-12-22 MED ORDER — DOCUSATE SODIUM 100 MG PO CAPS: 100.0000 mg | ORAL_CAPSULE | Freq: Two times a day (BID) | ORAL | Status: DC | PRN

## 2023-12-22 MED ORDER — VANCOMYCIN VARIABLE DOSE PER UNSTABLE RENAL FUNCTION (PHARMACIST DOSING): Status: DC

## 2023-12-22 MED ORDER — LACTATED RINGERS IV BOLUS
1000.0000 mL | Freq: Once | INTRAVENOUS | Status: AC
  Administered 2023-12-22: 1000 mL via INTRAVENOUS

## 2023-12-22 MED ORDER — INSULIN ASPART 100 UNIT/ML IJ SOLN: 0.0000 [IU] | INTRAMUSCULAR | Status: DC

## 2023-12-22 MED ORDER — LACTATED RINGERS IV SOLN: INTRAVENOUS | Status: DC

## 2023-12-22 MED ORDER — NOREPINEPHRINE 4 MG/250ML-% IV SOLN
0.0000 ug/min | INTRAVENOUS | Status: DC
  Administered 2023-12-23: 13 ug/min via INTRAVENOUS
  Administered 2023-12-23 – 2023-12-24 (×2): 2 ug/min via INTRAVENOUS
  Administered 2023-12-24: 5 ug/min via INTRAVENOUS
  Administered 2023-12-25: 1 ug/min via INTRAVENOUS
  Filled 2023-12-22 (×4): qty 250

## 2023-12-22 MED ORDER — SODIUM CHLORIDE 0.9 % IV SOLN
2.0000 g | Freq: Once | INTRAVENOUS | Status: AC
  Administered 2023-12-22: 2 g via INTRAVENOUS
  Filled 2023-12-22: qty 12.5

## 2023-12-22 MED ORDER — NOREPINEPHRINE 4 MG/250ML-% IV SOLN
INTRAVENOUS | Status: AC
  Administered 2023-12-22: 10 ug/min via INTRAVENOUS
  Filled 2023-12-22: qty 250

## 2023-12-22 MED ORDER — HYDROCORTISONE SOD SUC (PF) 100 MG IJ SOLR
100.0000 mg | Freq: Three times a day (TID) | INTRAMUSCULAR | Status: DC
  Administered 2023-12-22 – 2023-12-26 (×11): 100 mg via INTRAVENOUS
  Filled 2023-12-22 (×11): qty 2

## 2023-12-22 MED ORDER — SODIUM CHLORIDE 0.9 % IV SOLN
1.0000 g | INTRAVENOUS | Status: DC
  Administered 2023-12-23: 1 g via INTRAVENOUS
  Filled 2023-12-22 (×2): qty 10

## 2023-12-22 MED ORDER — METRONIDAZOLE 500 MG/100ML IV SOLN
500.0000 mg | Freq: Once | INTRAVENOUS | Status: AC
  Administered 2023-12-22: 500 mg via INTRAVENOUS
  Filled 2023-12-22: qty 100

## 2023-12-22 MED ORDER — SODIUM BICARBONATE 8.4 % IV SOLN
100.0000 meq | Freq: Once | INTRAVENOUS | Status: AC
  Administered 2023-12-22: 100 meq via INTRAVENOUS
  Filled 2023-12-22: qty 100

## 2023-12-22 MED ORDER — HYDROCORTISONE SOD SUC (PF) 100 MG IJ SOLR: 50.0000 mg | Freq: Once | INTRAMUSCULAR | Status: AC

## 2023-12-22 MED ORDER — ACETAMINOPHEN 650 MG RE SUPP
650.0000 mg | Freq: Once | RECTAL | Status: AC
  Administered 2023-12-22: 650 mg via RECTAL
  Filled 2023-12-22: qty 1

## 2023-12-22 MED ORDER — SODIUM BICARBONATE 8.4 % IV SOLN
INTRAVENOUS | Status: AC
  Filled 2023-12-22 (×2): qty 1000

## 2023-12-22 MED ORDER — CHLORHEXIDINE GLUCONATE CLOTH 2 % EX PADS
6.0000 | MEDICATED_PAD | Freq: Every day | CUTANEOUS | Status: DC
  Administered 2023-12-22 – 2024-01-03 (×13): 6 via TOPICAL

## 2023-12-22 MED ORDER — METRONIDAZOLE 500 MG/100ML IV SOLN
500.0000 mg | Freq: Two times a day (BID) | INTRAVENOUS | Status: DC
  Administered 2023-12-23 (×2): 500 mg via INTRAVENOUS
  Filled 2023-12-22 (×2): qty 100

## 2023-12-22 MED ORDER — POLYETHYLENE GLYCOL 3350 17 G PO PACK: 17.0000 g | PACK | Freq: Every day | ORAL | Status: DC | PRN

## 2023-12-22 MED ORDER — VASOPRESSIN 20 UNITS/100 ML INFUSION FOR SHOCK: 0.0400 [IU]/min | INTRAVENOUS | Status: DC

## 2023-12-22 MED ORDER — HALOPERIDOL LACTATE 5 MG/ML IJ SOLN
1.0000 mg | Freq: Once | INTRAMUSCULAR | Status: AC
  Administered 2023-12-22: 1 mg via INTRAVENOUS
  Filled 2023-12-22: qty 1

## 2023-12-22 MED ORDER — HEPARIN SODIUM (PORCINE) 5000 UNIT/ML IJ SOLN
5000.0000 [IU] | Freq: Three times a day (TID) | INTRAMUSCULAR | Status: DC
  Administered 2023-12-23 – 2024-01-01 (×28): 5000 [IU] via SUBCUTANEOUS
  Filled 2023-12-22 (×27): qty 1

## 2023-12-22 MED ORDER — METRONIDAZOLE 500 MG/100ML IV SOLN: 500.0000 mg | Freq: Three times a day (TID) | INTRAVENOUS | Status: DC

## 2023-12-22 MED ORDER — VANCOMYCIN HCL IN DEXTROSE 1-5 GM/200ML-% IV SOLN
1000.0000 mg | Freq: Once | INTRAVENOUS | Status: AC
  Administered 2023-12-22: 1000 mg via INTRAVENOUS
  Filled 2023-12-22: qty 200

## 2023-12-22 MED ORDER — ONDANSETRON HCL 4 MG/2ML IJ SOLN: 4.0000 mg | Freq: Four times a day (QID) | INTRAMUSCULAR | Status: DC | PRN

## 2023-12-22 MED ORDER — LACTATED RINGERS IV BOLUS
30.0000 mL/kg | Freq: Once | INTRAVENOUS | Status: AC
  Administered 2023-12-22: 1443 mL via INTRAVENOUS

## 2023-12-22 NOTE — Progress Notes (Addendum)
 Pharmacy Antibiotic Note  Rhonda [name] is a 68 y.o. female admitted on 12/22/2023 with increased lethargy and hypotension.  Pharmacy has been consulted for cefepime/vancomycin dosing for possible urosepsis. PMH includes renal transplant, bladder cancer s/p nephrostomy tube   -WBC 16, Tmax 102.3, sCr 4.28 (bl~1) -Blood/urine cultures collected -MRSA PCR collected; Resp panel negative -CXR: no active disease -CT: possible SBO; no renal stones or peritransplant fluid collected, possible UTI  Plan: -Cefepime 1g IV every 24 hours -Vancomycin 1g IV x1 -Vancomycin variable dosing given AKI -Flagyl 500mg  IV every 12 hours  -Monitor renal function -Follow up signs of clinical improvement, LOT, de-escalation of antibiotics   Height: 5' 3 (160 cm) Weight: 48.1 kg (106 lb) IBW/kg (Calculated) : 52.4  Temp (24hrs), Avg:99.1 F (37.3 C), Min:97.4 F (36.3 C), Max:102.3 F (39.1 C)  Recent Labs  Lab 12/22/23 1813 12/22/23 1842 12/22/23 2227  WBC 16.0*  --   --   CREATININE 4.28*  --   --   LATICACIDVEN  --  11.6* 5.8*    Estimated Creatinine Clearance: 9.6 mL/min (A) (by C-G formula based on SCr of 4.28 mg/dL (H)).    Allergies  Allergen Reactions   Penicillins Rash   Sulfa Antibiotics Rash    Antimicrobials this admission: Cefepime 11/5 >>  Vancomycin 11/5 >>   Microbiology results: 11/5 BCx:  11/5 UCx:   11/5 MRSA PCR:   Thank you for allowing pharmacy to be a part of this patient's care.  Lynwood Poplar, PharmD, BCPS Clinical Pharmacist 12/22/2023 11:07 PM

## 2023-12-22 NOTE — ED Notes (Signed)
Patient placed on CPR pads.

## 2023-12-22 NOTE — H&P (Signed)
 NAME:  Juanette Urizar, MRN:  [id number], DOB:  14-Jan-1956, LOS: 0 ADMISSION DATE:  12/22/2023, CONSULTATION DATE:  11/5 REFERRING MD:  EDP, CHIEF COMPLAINT:  hypotension   History of Present Illness:  68 yo female presents to decreased state of consciousness. Pt reportedly normally alert and oriented x4. Today EMS was called as over the past 24 hours pt has been more lethargic. There is no history of known sick contact, no fever/chills, no ha/cp/dizziness. Pt's husband at bedside and provides history. States that she was in her normal state of health, activity level and mentation until this am. She awoke feeling sick but able to still mobilize and communicate as normal. As the day progressed pt began feeling nauseous, vomiting and diarrhea. She also became more lethargic and unable to speak with husband or communicate for which he contacted ems.   He states that she had a episode like this about 1 month ago and has had nephrostomy tube placed 2/2 hydronephrosis found 2/2 tumor mass. He endorses that abx no longer seem to work for her, he feels frustrated that she does not seem to be improving and that she continues to get sick. I attempted to provide support.   Pt is extremely mobile/restless in bed. Not following commands and non communicative. She does not appear to be in any pain just overall restless.   Ccm was asked to admit pt 2/2 hypotension and need for vasopressor support.   Pertinent  Medical History  Bladder cancer Nephrostomy tube Htn Hyperlipidemia Osteoporosis Ckd 2  Significant Hospital Events: Including procedures, antibiotic start and stop dates in addition to other pertinent events   Admitted to icu 11/5  Interim History / Subjective:    Objective    Blood pressure (!) 75/54, pulse (!) 108, temperature (!) 102.3 F (39.1 C), temperature source Rectal, resp. rate (!) 27, height 5' 3 (1.6 m), weight 48.1 kg, SpO2 97%.       No intake or output data in  the 24 hours ending 12/22/23 2130 Filed Weights   12/22/23 1807  Weight: 48.1 kg    Examination: General: restless in bed, appears acutely ill, thin female HENT: ncat, perrla, R sclera with overt edema, sclera injected but not icteric Lungs: ctab Cardiovascular: rrr no m/g/r Abdomen: soft, nt, nd bs +, R LQ drain in place Extremities: no c/c/e Neuro: no focal deficits, not following commands but moves all 4 without issue GU: deferred.   Resolved problem list   Assessment and Plan  Septic shock 2/2 presumed uti H/o bladder cancer Nephrostomy tube placement with hydronephrosis Acute hypoxic resp failure req 2L supplemental oxygen Lactic acidosis Aki on ckd Elevated lft Metabolic encephalopathy Leukocytosis Possible sbo hypoglycemia -titrate vasopressor to map >65 -will need cvc if vasopressor unable to wean but at this time would req sedation and further compromised state to safely place -will add bicarb gtt with initial amps to help with acidosis and hopefully bp -cont with volume -trend lactate -check ammonia -trend renal indices I/O -will need small bowel follow thru once able to take po to take agent to further eval small bowel  -f/u cx -abx ongoing -hold immunosuppressants for now, with chronic steroid use will add stress dose steroids in light of hypotension -with hypoglycemia will hold any insulin for now    Labs   CBC: Recent Labs  Lab 12/22/23 1813  WBC 16.0*  NEUTROABS 13.4*  HGB 11.4*  HCT 37.2  MCV 101.6*  PLT 191    Basic  Metabolic Panel: Recent Labs  Lab 12/22/23 1813  NA 139  K 4.2  CL 102  CO2 11*  GLUCOSE 57*  BUN 57*  CREATININE 4.28*  CALCIUM 8.1*   GFR: Estimated Creatinine Clearance: 9.6 mL/min (A) (by C-G formula based on SCr of 4.28 mg/dL (H)). Recent Labs  Lab 12/22/23 1813 12/22/23 1842  WBC 16.0*  --   LATICACIDVEN  --  11.6*    Liver Function Tests: Recent Labs  Lab 12/22/23 1813  AST 110*  ALT 36  ALKPHOS  40  BILITOT 0.6  PROT 5.6*  ALBUMIN 2.7*   No results for input(s): LIPASE, AMYLASE in the last 168 hours. No results for input(s): AMMONIA in the last 168 hours.  ABG No results found for: PHART, PCO2ART, PO2ART, HCO3, TCO2, ACIDBASEDEF, O2SAT   Coagulation Profile: Recent Labs  Lab 12/22/23 1813  INR 1.4*    Cardiac Enzymes: No results for input(s): CKTOTAL, CKMB, CKMBINDEX, TROPONINI in the last 168 hours.  HbA1C: No results found for: HGBA1C  CBG: No results for input(s): GLUCAP in the last 168 hours.  Review of Systems:   As per hpi  Past Medical History:  She,  has a past medical history of Diabetes mellitus without complication (HCC), bladder cancer, Hyperlipidemia, Hypertension, and Kidney transplant recipient.   Surgical History:   Past Surgical History:  Procedure Laterality Date   COLONOSCOPY     Kidney transplant 2001 N/A      Social History:   reports that she quit smoking about 40 years ago. Her smoking use included cigarettes. She has never used smokeless tobacco. She reports that she does not currently use alcohol. She reports that she does not use drugs.   Family History:  Her family history includes Breast cancer in her sister; Diabetes in her father; Heart disease in her mother and sister; Ovarian cancer in her sister; Stroke in her father. There is no history of Colon cancer, Esophageal cancer, Pancreatic cancer, or Stomach cancer.   Allergies Allergies  Allergen Reactions   Penicillins Rash   Sulfa Antibiotics Rash     Home Medications  Prior to Admission medications   Medication Sig Start Date End Date Taking? Authorizing Provider  Cholecalciferol (VITAMIN D -3) 25 MCG (1000 UT) CAPS Take 1,000 capsules by mouth daily.    [provider]  cyanocobalamin (VITAMIN B12) 1000 MCG tablet Take 1,000 mcg by mouth every other day.    [provider]  lisinopril (ZESTRIL) 5 MG tablet TAKE 1  TABLET(5 MG) BY MOUTH DAILY Patient not taking: Reported on 12/21/2023 06/03/20   [provider]  lovastatin (MEVACOR) 20 MG tablet TAKE 1 TABLET(20 MG) BY MOUTH AT BEDTIME 06/03/20   [provider]  predniSONE (DELTASONE) 1 MG tablet TAKE 4 TABLETS(4 MG) BY MOUTH DAILY 06/03/20   [provider]  tacrolimus (PROGRAF) 1 MG capsule TAKE 3 CAPSULES BY MOUTH EVERY 12 HOURS 03/11/20   [provider]     Critical care time: 

## 2023-12-22 NOTE — ED Triage Notes (Signed)
 Patient arrives via [facility] for potential sepsis. Patient is a bladder cancer patient with nephrostomy replaced yesterday. Patient lives with husband who endorses she has worsened over the day, especially the 45 min prior to EMS arrival. Patient normally alert and oriented x4, GCS 9 with EMS. Hx of sepsis prior. No nausea vomiting. 1 liter given en route, 20 LFA.   EMS vitals RR 40-50 98 on room air 82/58 initially BP Capno 18-20 98.6 temp HR 130 CBG 102

## 2023-12-22 NOTE — ED Provider Notes (Signed)
 [place] [facility] DEPARTMENT AT [facility] Provider Note  CSN: [id number] Arrival date & time: 12/22/23 1758  Chief Complaint(s) Altered Mental Status  HPI Rhonda [name] is a 68 y.o. female history of renal transplant, bladder cancer status post nephrostomy tube, hypertension, hyperlipidemia, diabetes presenting to the emergency department with altered mental status.  Patient apparently throughout the day has been increasingly confused, not doing well.  Spouse called paramedics who found the patient to be confused, warm to the touch, not following commands, hypotensive.  Blood glucose was normal.  Patient was brought to the emergency department for further care.  History is limited as the patient is confused/altered   Past Medical History Past Medical History:  Diagnosis Date   Diabetes mellitus without complication (HCC)    Hx of bladder cancer    Hyperlipidemia    Hypertension    Kidney transplant recipient    Patient Active Problem List   Diagnosis Date Noted   Sepsis due to urinary tract infection (HCC) 12/22/2023   Bladder cancer (HCC) 11/29/2023   Age related osteoporosis 04/02/2022   Vitamin D  insufficiency 04/02/2022   Prediabetes 04/02/2022   Home Medication(s) Prior to Admission medications   Medication Sig Start Date End Date Taking? Authorizing Provider  Cholecalciferol (VITAMIN D -3) 25 MCG (1000 UT) CAPS Take 1,000 capsules by mouth daily.    [provider]  cyanocobalamin (VITAMIN B12) 1000 MCG tablet Take 1,000 mcg by mouth every other day.    [provider]  lisinopril (ZESTRIL) 5 MG tablet TAKE 1 TABLET(5 MG) BY MOUTH DAILY Patient not taking: Reported on 12/21/2023 06/03/20   [provider]  lovastatin (MEVACOR) 20 MG tablet TAKE 1 TABLET(20 MG) BY MOUTH AT BEDTIME 06/03/20   [provider]  predniSONE (DELTASONE) 1 MG tablet TAKE 4 TABLETS(4 MG) BY MOUTH DAILY 06/03/20   [provider]   tacrolimus (PROGRAF) 1 MG capsule TAKE 3 CAPSULES BY MOUTH EVERY 12 HOURS 03/11/20   [provider]                                                                                                                                    Past Surgical History Past Surgical History:  Procedure Laterality Date   COLONOSCOPY     Kidney transplant 2001 N/A    Family History Family History  Problem Relation Age of Onset   Heart disease Mother    Diabetes Father    Stroke Father    Heart disease Sister    Breast cancer Sister    Ovarian cancer Sister    Colon cancer Neg Hx    Esophageal cancer Neg Hx    Pancreatic cancer Neg Hx    Stomach cancer Neg Hx     Social History Social History   Tobacco Use   Smoking status: Former    Current packs/day: 0.00    Types: Cigarettes  Quit date: 02/17/1983    Years since quitting: 40.8   Smokeless tobacco: Never  Vaping Use   Vaping status: Never Used  Substance Use Topics   Alcohol use: Not Currently   Drug use: Never   Allergies Penicillins and Sulfa antibiotics  Review of Systems Review of Systems  Unable to perform ROS: Mental status change    Physical Exam Vital Signs  I have reviewed the triage vital signs BP (!) 75/54   Pulse (!) 108   Temp (!) 102.3 F (39.1 C) (Rectal)   Resp (!) 27   Ht 5' 3 (1.6 m)   Wt 48.1 kg   SpO2 97%   BMI 18.78 kg/m  Physical Exam Vitals and nursing note reviewed.  Constitutional:      General: She is in acute distress.     Appearance: She is well-developed. She is ill-appearing.  HENT:     Head: Normocephalic and atraumatic.     Mouth/Throat:     Mouth: Mucous membranes are dry.  Eyes:     Pupils: Pupils are equal, round, and reactive to light.  Cardiovascular:     Rate and Rhythm: Regular rhythm. Tachycardia present.     Heart sounds: No murmur heard. Pulmonary:     Effort: Pulmonary effort is normal. Tachypnea present. No respiratory distress.     Breath sounds: Normal  breath sounds.  Abdominal:     General: Abdomen is flat.     Palpations: Abdomen is soft.     Tenderness: There is abdominal tenderness (generalized).     Comments: RLQ nephrostomy tube in place   Musculoskeletal:        General: No tenderness.     Cervical back: Neck supple.     Right lower leg: No edema.     Left lower leg: No edema.  Skin:    General: Skin is warm and dry.  Neurological:     Mental Status: She is alert.     Comments: Alert, looking around room, not following command, no spontaneous speech, moving all 4 extremities, localizes pain  Psychiatric:        Mood and Affect: Mood normal.        Behavior: Behavior normal.     ED Results and Treatments Labs (all labs ordered are listed, but only abnormal results are displayed) Labs Reviewed  COMPREHENSIVE METABOLIC PANEL WITH GFR - Abnormal; Notable for the following components:      Result Value   CO2 11 (*)    Glucose, Bld 57 (*)    BUN 57 (*)    Creatinine, Ser 4.28 (*)    Calcium 8.1 (*)    Total Protein 5.6 (*)    Albumin 2.7 (*)    AST 110 (*)    GFR, Estimated 11 (*)    Anion gap 26 (*)    All other components within normal limits  CBC WITH DIFFERENTIAL/PLATELET - Abnormal; Notable for the following components:   WBC 16.0 (*)    RBC 3.66 (*)    Hemoglobin 11.4 (*)    MCV 101.6 (*)    Neutro Abs 13.4 (*)    Abs Immature Granulocytes 0.31 (*)    All other components within normal limits  PROTIME-INR - Abnormal; Notable for the following components:   Prothrombin Time 18.3 (*)    INR 1.4 (*)    All other components within normal limits  URINALYSIS, ROUTINE W REFLEX MICROSCOPIC - Abnormal; Notable for the following components:   Color,  Urine AMBER (*)    APPearance CLOUDY (*)    Glucose, UA 50 (*)    Hgb urine dipstick SMALL (*)    Protein, ur >=300 (*)    Nitrite POSITIVE (*)    Leukocytes,Ua SMALL (*)    Bacteria, UA FEW (*)    Non Squamous Epithelial 0-5 (*)    All other components within  normal limits  I-STAT CG4 LACTIC ACID, ED - Abnormal; Notable for the following components:   Lactic Acid, Venous 11.6 (*)    All other components within normal limits  RESP PANEL BY RT-PCR (RSV, FLU A&B, COVID)  RVPGX2  CULTURE, BLOOD (ROUTINE X 2)  CULTURE, BLOOD (ROUTINE X 2)  URINE CULTURE  MRSA NEXT GEN BY PCR, NASAL  HIV ANTIBODY (ROUTINE TESTING W REFLEX)  TACROLIMUS LEVEL  CBC  BASIC METABOLIC PANEL WITH GFR  MAGNESIUM  PHOSPHORUS  I-STAT CG4 LACTIC ACID, ED                                                                                                                          Radiology CT Renal Stone Study Result Date: 12/22/2023 CLINICAL DATA:  Nephrostomy catheter displacement. EXAM: CT ABDOMEN AND PELVIS WITHOUT CONTRAST TECHNIQUE: Multidetector CT imaging of the abdomen and pelvis was performed following the standard protocol without IV contrast. RADIATION DOSE REDUCTION: This exam was performed according to the departmental dose-optimization program which includes automated exposure control, adjustment of the mA and/or kV according to patient size and/or use of iterative reconstruction technique. COMPARISON:  CT abdomen pelvis dated 09/24/2023. FINDINGS: Evaluation of this exam is limited in the absence of intravenous contrast. Lower chest: No acute abnormality. No intra-abdominal free air.  Trace free fluid in the pelvis. Hepatobiliary: The liver is unremarkable. No biliary dilatation. Gallstone. No pericholecystic fluid or evidence of acute cholecystitis by CT. Pancreas: Unremarkable. No pancreatic ductal dilatation or surrounding inflammatory changes. Spleen: Normal in size without focal abnormality. Adrenals/Urinary Tract: The adrenal glands unremarkable. Status post prior right nephrectomy. The left native kidney is atrophic. Two right lower quadrant renal transplant noted. There has been interval placement of a percutaneous nephrostomy with pigtail tip in the upper pole of the  inferior transplant kidney with near resolution of the previously seen hydronephrosis. No stones or peritransplant fluid collection noted. There is however mild stranding adjacent to the transplant kidney and urinary bladder which may be related to recent procedure. Correlation with urinalysis recommended to evaluate for possibility of UTI. Stomach/Bowel: Evaluation of the bowel is limited in the absence of oral contrast. There is postsurgical changes and anastomotic staple line in the region of the rectum. There is sigmoid diverticulosis. Perisigmoid stranding in the pelvis likely extension from the urinary bladder. Diverticulitis is not excluded. Clinical correlation recommended. Multiple dilated small bowel loops measure up to 3.5 cm in caliber and likely representing ileus. Developing small bowel obstruction is not excluded. Small-bowel series may provide better evaluation if there is clinical concern for obstruction.  The appendix is normal. Vascular/Lymphatic: Mild aortoiliac atherosclerotic disease. The IVC is unremarkable. No portal venous gas. There is no adenopathy. Reproductive: The uterus is grossly unremarkable. No suspicious adnexal masses. Other: None Musculoskeletal: Osteopenia with degenerative changes of the spine. No acute osseous pathology. IMPRESSION: 1. Interval placement of a percutaneous nephrostomy in the inferior right lower quadrant transplant kidney with near resolution of the previously seen hydronephrosis. No stones or peritransplant fluid collection. 2. Mild stranding adjacent to the transplant kidney and urinary bladder may be related to recent procedure. Correlation with urinalysis recommended to evaluate for possibility of UTI. 3. Sigmoid diverticulosis. Perisigmoid stranding in the pelvis may be related to procedure or UTI. Diverticulitis is not excluded. 4. Small bowel ileus versus developing obstruction. Small-bowel series may provide better evaluation if there is clinical concern  for obstruction. 5. Cholelithiasis. 6.  Aortic Atherosclerosis (ICD10-I70.0). Electronically Signed   By: Vanetta Chou M.D.   On: 12/22/2023 19:45   CT Head Wo Contrast Result Date: 12/22/2023 EXAM: CT HEAD WITHOUT CONTRAST 12/22/2023 07:19:00 PM TECHNIQUE: CT of the head was performed without the administration of intravenous contrast. Automated exposure control, iterative reconstruction, and/or weight based adjustment of the mA/kV was utilized to reduce the radiation dose to as low as reasonably achievable. COMPARISON: None available. CLINICAL HISTORY: Mental status change, unknown cause. FINDINGS: BRAIN AND VENTRICLES: No acute hemorrhage. No evidence of acute infarct. No hydrocephalus. No extra-axial collection. No mass effect or midline shift. ORBITS: Bilateral lens replacement noted. SINUSES: Fluid in right mastoid air cells. No obstructing nasopharyngeal lesion is present. Mucosal thickening in bilateral maxillary and ethmoid sinuses. SOFT TISSUES AND SKULL: No acute soft tissue abnormality. No skull fracture. IMPRESSION: 1. No acute intracranial abnormality. 2. Right mastoid effusion and paranasal sinus inflammatory changes. No obstructing nasopharyngeal lesion identified. Electronically signed by: Lonni Necessary MD 12/22/2023 07:38 PM EST RP Workstation: [id number]   DG Chest Port 1 View Result Date: 12/22/2023 CLINICAL DATA:  History of bladder cancer with recent nephrostomy tube placement, presenting with altered mental status. EXAM: PORTABLE CHEST 1 VIEW COMPARISON:  September 24, 2023 FINDINGS: The heart size and mediastinal contours are within normal limits. No acute infiltrate, pleural effusion or pneumothorax is identified. The visualized skeletal structures are unremarkable. IMPRESSION: No active disease. Electronically Signed   By: Suzen Dials M.D.   On: 12/22/2023 19:03    Pertinent labs & imaging results that were available during my care of the patient were reviewed by me and  considered in my medical decision making (see MDM for details).  Medications Ordered in ED Medications  lactated ringers infusion ( Intravenous New Bag/Given 12/22/23 1831)  norepinephrine (LEVOPHED) 4mg  in 250mL (0.016 mg/mL) premix infusion (15 mcg/min Intravenous Rate/Dose Change 12/22/23 2051)  hydrocortisone sodium succinate (SOLU-CORTEF) 100 MG injection 50 mg (has no administration in time range)  Chlorhexidine Gluconate Cloth 2 % PADS 6 each (has no administration in time range)  docusate sodium (COLACE) capsule 100 mg (has no administration in time range)  polyethylene glycol (MIRALAX / GLYCOLAX) packet 17 g (has no administration in time range)  heparin injection 5,000 Units (has no administration in time range)  ondansetron  (ZOFRAN ) injection 4 mg (has no administration in time range)  insulin aspart (novoLOG) injection 0-9 Units (has no administration in time range)  acetaminophen  (TYLENOL ) suppository 650 mg (650 mg Rectal Given 12/22/23 1828)  lactated ringers bolus 1,443 mL (0 mLs Intravenous Stopped 12/22/23 2019)  ceFEPIme (MAXIPIME) 2 g in sodium chloride  0.9 % 100 mL  IVPB (0 g Intravenous Stopped 12/22/23 2009)  metroNIDAZOLE (FLAGYL) IVPB 500 mg (0 mg Intravenous Stopped 12/22/23 2009)  vancomycin (VANCOCIN) IVPB 1000 mg/200 mL premix (0 mg Intravenous Stopped 12/22/23 2039)  haloperidol lactate (HALDOL) injection 1 mg (1 mg Intravenous Given 12/22/23 1857)  lactated ringers bolus 1,000 mL (1,000 mLs Intravenous New Bag/Given 12/22/23 2018)                                                                                                                                     Procedures .Critical Care  Performed by: Francesca Elsie CROME, MD Authorized by: Francesca Elsie CROME, MD   Critical care provider statement:    Critical care time (minutes):  45   Critical care was necessary to treat or prevent imminent or life-threatening deterioration of the following conditions:  Sepsis    Critical care was time spent personally by me on the following activities:  Development of treatment plan with patient or surrogate, discussions with consultants, evaluation of patient's response to treatment, examination of patient, ordering and review of laboratory studies, ordering and review of radiographic studies, ordering and performing treatments and interventions, pulse oximetry, re-evaluation of patient's condition and review of old charts   Care discussed with: admitting provider     (including critical care time)  Medical Decision Making / ED Course   MDM:  68 year old presenting to the emergency department with altered mental status.  Patient ill-appearing, febrile, tachycardic and tachypneic.  Suspect likely sepsis.  Reviewed notes, patient has had prior sepsis due to UTI, had recent nephrostomy tube change, seems to be most likely source.  Will obtain CT to evaluate for nephrostomy tube placement or other intra-abdominal process given tenderness.  Check urinalysis, labs.  Also check chest x-ray.  Given altered mental status will check CT head although seems more likely to be due to underlying metabolic process/sepsis.  Lower concern for meningitis or CNS infection unless no alternative source of infection is identified.  Will treat with broad-spectrum antibiotics, fluids.  Patient is altered but protecting airway and does not require airway intervention.  Will reassess.  Anticipate likely need for admission.  Clinical Course as of 12/22/23 2155  Wed Dec 22, 2023  2134 Patient developed hypotension, required initiation of levophed. Will also give hydrocortisone given chronic steroid use. Pt will need to be admitted. Signed out to ICU physician Dr. Layman given icu needs [WS]    Clinical Course User Index [WS] Francesca Elsie CROME, MD     Additional history obtained: -Additional history obtained from family, ems, and spouse -External records from outside source obtained and  reviewed including: Chart review including previous notes, labs, imaging, consultation notes including prior notes    Lab Tests: -I ordered, reviewed, and interpreted labs.   The pertinent results include:   Labs Reviewed  COMPREHENSIVE METABOLIC PANEL WITH GFR - Abnormal; Notable for the following components:  Result Value   CO2 11 (*)    Glucose, Bld 57 (*)    BUN 57 (*)    Creatinine, Ser 4.28 (*)    Calcium 8.1 (*)    Total Protein 5.6 (*)    Albumin 2.7 (*)    AST 110 (*)    GFR, Estimated 11 (*)    Anion gap 26 (*)    All other components within normal limits  CBC WITH DIFFERENTIAL/PLATELET - Abnormal; Notable for the following components:   WBC 16.0 (*)    RBC 3.66 (*)    Hemoglobin 11.4 (*)    MCV 101.6 (*)    Neutro Abs 13.4 (*)    Abs Immature Granulocytes 0.31 (*)    All other components within normal limits  PROTIME-INR - Abnormal; Notable for the following components:   Prothrombin Time 18.3 (*)    INR 1.4 (*)    All other components within normal limits  URINALYSIS, ROUTINE W REFLEX MICROSCOPIC - Abnormal; Notable for the following components:   Color, Urine AMBER (*)    APPearance CLOUDY (*)    Glucose, UA 50 (*)    Hgb urine dipstick SMALL (*)    Protein, ur >=300 (*)    Nitrite POSITIVE (*)    Leukocytes,Ua SMALL (*)    Bacteria, UA FEW (*)    Non Squamous Epithelial 0-5 (*)    All other components within normal limits  I-STAT CG4 LACTIC ACID, ED - Abnormal; Notable for the following components:   Lactic Acid, Venous 11.6 (*)    All other components within normal limits  RESP PANEL BY RT-PCR (RSV, FLU A&B, COVID)  RVPGX2  CULTURE, BLOOD (ROUTINE X 2)  CULTURE, BLOOD (ROUTINE X 2)  URINE CULTURE  MRSA NEXT GEN BY PCR, NASAL  HIV ANTIBODY (ROUTINE TESTING W REFLEX)  TACROLIMUS LEVEL  CBC  BASIC METABOLIC PANEL WITH GFR  MAGNESIUM  PHOSPHORUS  I-STAT CG4 LACTIC ACID, ED    Notable for lactic acidosis, AKI, leukocytosis, acidosis, UTI  EKG    EKG Interpretation Date/Time:  Wednesday December 22 2023 18:01:16 EST Ventricular Rate:  131 PR Interval:  105 QRS Duration:  82 QT Interval:  308 QTC Calculation: 455 R Axis:   51  Text Interpretation: Sinus tachycardia RSR' in V1 or V2, probably normal variant Borderline repolarization abnormality Confirmed by Francesca Fallow ([id number]) on 12/22/2023 6:40:47 PM         Imaging Studies ordered: I ordered imaging studies including CT head, CT renal stone  On my interpretation imaging demonstrates pyelonephritis  I independently visualized and interpreted imaging. I agree with the radiologist interpretation   Medicines ordered and prescription drug management: Meds ordered this encounter  Medications   DISCONTD: lactated ringers infusion   acetaminophen  (TYLENOL ) suppository 650 mg   lactated ringers bolus 1,443 mL   lactated ringers infusion   ceFEPIme (MAXIPIME) 2 g in sodium chloride  0.9 % 100 mL IVPB    Antibiotic Indication::   Other Indication (list below)    Other Indication::   Unknown Source.   metroNIDAZOLE (FLAGYL) IVPB 500 mg    Antibiotic Indication::   Other Indication (list below)    Other Indication::   Unknown Source.   vancomycin (VANCOCIN) IVPB 1000 mg/200 mL premix    Indication::   Other Indication (list below)    Other Indication::   Unknown Source.   haloperidol lactate (HALDOL) injection 1 mg   lactated ringers bolus 1,000 mL   norepinephrine (LEVOPHED) 4-5  MG/250ML-% infusion LARRAINE Pica, Mariel A: cabinet override   norepinephrine (LEVOPHED) 4mg  in 250mL (0.016 mg/mL) premix infusion   hydrocortisone sodium succinate (SOLU-CORTEF) 100 MG injection 50 mg   Chlorhexidine Gluconate Cloth 2 % PADS 6 each   docusate sodium (COLACE) capsule 100 mg   polyethylene glycol (MIRALAX / GLYCOLAX) packet 17 g   heparin injection 5,000 Units   ondansetron  (ZOFRAN ) injection 4 mg   insulin aspart (novoLOG) injection 0-9 Units    Correction coverage::    Sensitive (thin, NPO, renal)    CBG < 70::   Implement Hypoglycemia Standing Orders and refer to Hypoglycemia Standing Orders sidebar report    CBG 70 - 120::   0 units    CBG 121 - 150::   1 unit    CBG 151 - 200::   2 units    CBG 201 - 250::   3 units    CBG 251 - 300::   5 units    CBG 301 - 350::   7 units    CBG 351 - 400:   9 units    CBG > 400:   call MD and obtain STAT lab verification    -I have reviewed the patients home medicines and have made adjustments as needed   Consultations Obtained: I requested consultation with the intensivist,  and discussed lab and imaging findings as well as pertinent plan - they recommend: admit to ICU    Cardiac Monitoring: The patient was maintained on a cardiac monitor.  I personally viewed and interpreted the cardiac monitored which showed an underlying rhythm of: sinus tachycardia   Reevaluation: After the interventions noted above, I reevaluated the patient and found that their symptoms have stayed the same  Co morbidities that complicate the patient evaluation  Past Medical History:  Diagnosis Date   Diabetes mellitus without complication (HCC)    Hx of bladder cancer    Hyperlipidemia    Hypertension    Kidney transplant recipient       Dispostion: Disposition decision including need for hospitalization was considered, and patient admitted to the hospital.    Final Clinical Impression(s) / ED Diagnoses Final diagnoses:  Septic shock (HCC)  Recurrent sepsis due to urinary tract infection (HCC)     This chart was dictated using voice recognition software.  Despite best efforts to proofread,  errors can occur which can change the documentation meaning.    Francesca Elsie CROME, MD 12/22/23 2155

## 2023-12-22 NOTE — Sepsis Progress Note (Signed)
 Notified bedside nurse of need to draw repeat lactic acid.

## 2023-12-22 NOTE — Progress Notes (Signed)
   12/22/23 2206  Spiritual Encounters  Type of Visit Declined chaplain visit   Chaplain stop by to visit Pt and spouse. Spouse informed chaplain no chaplain services are needed at this time.

## 2023-12-22 NOTE — Sepsis Progress Note (Signed)
 Elink monitoring for the code sepsis protocol.

## 2023-12-22 NOTE — Progress Notes (Signed)
 eLink Physician-Brief Progress Note Patient Name: Rhonda [name] DOB: [birth year] MRN: [id number]   Date of Service  12/22/2023  HPI/Events of Note  68 year old with a history of bladder cancer with nephrostomy tube in place, metabolic syndrome who presents with lethargy thought to be secondary to septic shock from a urinary tract infection complicated by acute hypoxic respiratory failure and AKI.  Patient is tachypneic, tachycardic, and hypotensive saturating 97% on 2 L of oxygen.  On 16 mcg of norepinephrine and status post broad-spectrum antibiotics.  Results consistent with severe anion gap metabolic acidosis, resolving lactic acidosis, elevated creatinine, leukocytosis, grossly positive urinalysis.  Cultures sent.  Abdominal imaging consistent with potential pyelonephritis.  eICU Interventions  Maintain broad-spectrum antibiotics, cultures pending  Maintain norepinephrine infusion, add vasopressin.  Stress dose steroids.  Maintain bicarb infusion.  DVT prophylaxis with heparin GI prophylaxis not indicated   0618 - MagSulfate  Intervention Category Evaluation Type: New Patient Evaluation  Rhonda [name] 12/22/2023, 11:44 PM

## 2023-12-22 NOTE — ED Notes (Signed)
 EDP made aware of low blood pressures despite levo @10mcg . Per Francesca, MD, patient ok to be on 15mcg PIV (with max order of 40mcg).

## 2023-12-22 NOTE — ED Notes (Signed)
 EDP notified of patient low blood pressure. EDP at bedside.

## 2023-12-22 NOTE — Progress Notes (Signed)
 All fluids given before admission to MICU [phone]

## 2023-12-23 ENCOUNTER — Inpatient Hospital Stay (HOSPITAL_COMMUNITY)

## 2023-12-23 DIAGNOSIS — E8721 Acute metabolic acidosis: Secondary | ICD-10-CM

## 2023-12-23 DIAGNOSIS — N390 Urinary tract infection, site not specified: Secondary | ICD-10-CM | POA: Diagnosis not present

## 2023-12-23 DIAGNOSIS — A419 Sepsis, unspecified organism: Principal | ICD-10-CM | POA: Insufficient documentation

## 2023-12-23 DIAGNOSIS — N179 Acute kidney failure, unspecified: Secondary | ICD-10-CM | POA: Diagnosis not present

## 2023-12-23 DIAGNOSIS — N183 Chronic kidney disease, stage 3 unspecified: Secondary | ICD-10-CM

## 2023-12-23 DIAGNOSIS — R6521 Severe sepsis with septic shock: Secondary | ICD-10-CM | POA: Diagnosis not present

## 2023-12-23 LAB — CBC
HCT: 32.6 % — ABNORMAL LOW (ref 36.0–46.0)
Hemoglobin: 10.8 g/dL — ABNORMAL LOW (ref 12.0–15.0)
MCH: 31.5 pg (ref 26.0–34.0)
MCHC: 33.1 g/dL (ref 30.0–36.0)
MCV: 95 fL (ref 80.0–100.0)
Platelets: 151 K/uL (ref 150–400)
RBC: 3.43 MIL/uL — ABNORMAL LOW (ref 3.87–5.11)
RDW: 13.7 % (ref 11.5–15.5)
WBC: 19.5 K/uL — ABNORMAL HIGH (ref 4.0–10.5)
nRBC: 0 % (ref 0.0–0.2)

## 2023-12-23 LAB — BLOOD CULTURE ID PANEL (REFLEXED) - BCID2

## 2023-12-23 LAB — LACTIC ACID, PLASMA: Lactic Acid, Venous: 4.1 mmol/L (ref 0.5–1.9)

## 2023-12-23 LAB — COMPREHENSIVE METABOLIC PANEL WITH GFR
ALT: 98 U/L — ABNORMAL HIGH (ref 0–44)
AST: 282 U/L — ABNORMAL HIGH (ref 15–41)
Albumin: 2.2 g/dL — ABNORMAL LOW (ref 3.5–5.0)
Alkaline Phosphatase: 46 U/L (ref 38–126)
Anion gap: 19 — ABNORMAL HIGH (ref 5–15)
BUN: 54 mg/dL — ABNORMAL HIGH (ref 8–23)
CO2: 21 mmol/L — ABNORMAL LOW (ref 22–32)
Calcium: 7.3 mg/dL — ABNORMAL LOW (ref 8.9–10.3)
Chloride: 96 mmol/L — ABNORMAL LOW (ref 98–111)
Creatinine, Ser: 3.78 mg/dL — ABNORMAL HIGH (ref 0.44–1.00)
GFR, Estimated: 12 mL/min — ABNORMAL LOW (ref >60)
Glucose, Bld: 138 mg/dL — ABNORMAL HIGH (ref 70–99)
Potassium: 3.9 mmol/L (ref 3.5–5.1)
Sodium: 136 mmol/L (ref 135–145)
Total Bilirubin: 0.6 mg/dL (ref 0.0–1.2)
Total Protein: 4.9 g/dL — ABNORMAL LOW (ref 6.5–8.1)

## 2023-12-23 LAB — GLUCOSE, CAPILLARY
Glucose-Capillary: 130 mg/dL — ABNORMAL HIGH (ref 70–99)
Glucose-Capillary: 122 mg/dL — ABNORMAL HIGH (ref 70–99)
Glucose-Capillary: 161 mg/dL — ABNORMAL HIGH (ref 70–99)
Glucose-Capillary: 177 mg/dL — ABNORMAL HIGH (ref 70–99)
Glucose-Capillary: 191 mg/dL — ABNORMAL HIGH (ref 70–99)

## 2023-12-23 LAB — BASIC METABOLIC PANEL WITH GFR
Anion gap: 15 (ref 5–15)
BUN: 58 mg/dL — ABNORMAL HIGH (ref 8–23)
CO2: 29 mmol/L (ref 22–32)
Calcium: 6.9 mg/dL — ABNORMAL LOW (ref 8.9–10.3)
Chloride: 91 mmol/L — ABNORMAL LOW (ref 98–111)
Creatinine, Ser: 2.9 mg/dL — ABNORMAL HIGH (ref 0.44–1.00)
GFR, Estimated: 17 mL/min — ABNORMAL LOW (ref >60)
Glucose, Bld: 189 mg/dL — ABNORMAL HIGH (ref 70–99)
Potassium: 3.6 mmol/L (ref 3.5–5.1)
Sodium: 135 mmol/L (ref 135–145)

## 2023-12-23 LAB — PHOSPHORUS: Phosphorus: 6.7 mg/dL — ABNORMAL HIGH (ref 2.5–4.6)

## 2023-12-23 LAB — MAGNESIUM
Magnesium: 1.1 mg/dL — ABNORMAL LOW (ref 1.7–2.4)
Magnesium: 4.1 mg/dL — ABNORMAL HIGH (ref 1.7–2.4)

## 2023-12-23 LAB — HIV ANTIBODY (ROUTINE TESTING W REFLEX): HIV Screen 4th Generation wRfx: NONREACTIVE

## 2023-12-23 MED ORDER — MAGNESIUM SULFATE 4 GM/100ML IV SOLN: 4.0000 g | Freq: Once | INTRAVENOUS | Status: DC

## 2023-12-23 MED ORDER — ORAL CARE MOUTH RINSE: 15.0000 mL | OROMUCOSAL | Status: DC | PRN

## 2023-12-23 MED ORDER — MAGNESIUM SULFATE 50 % IJ SOLN
8.0000 g | Freq: Once | INTRAVENOUS | Status: AC
  Administered 2023-12-23: 8 g via INTRAVENOUS
  Filled 2023-12-23: qty 10

## 2023-12-23 NOTE — Plan of Care (Signed)
  Problem: Clinical Measurements: Goal: Ability to maintain clinical measurements within normal limits will improve Outcome: Progressing Goal: Will remain free from infection Outcome: Progressing Goal: Respiratory complications will improve Outcome: Progressing Goal: Cardiovascular complication will be avoided Outcome: Progressing   Problem: Safety: Goal: Non-violent Restraint(s) Outcome: Not Progressing Note: Pt currently still unable to follow commands or verbalize needs. Continues to be restless in bed with poor safety awareness. Frequent verbal contacts with reorientation and repositioning, husband at bedside   Problem: Education: Goal: Knowledge of General Education information will improve Description: Including pain rating scale, medication(s)/side effects and non-pharmacologic comfort measures Outcome: Not Progressing

## 2023-12-23 NOTE — Progress Notes (Signed)
 PHARMACY - PHYSICIAN COMMUNICATION CRITICAL VALUE ALERT - BLOOD CULTURE IDENTIFICATION (BCID)  Rhonda [name] is an 68 y.o. female who presented to [facility] on 12/22/2023 with hypotension/sepsis.  Assessment: BCx growing Pseudomonas in 2 of 4 bottles.  Name of physician (or Provider) Contacted: Dr. Shelah  Current antibiotics: vanc, cefepime and Flagyl  Changes to prescribed antibiotics recommended:  Recommendations accepted by provider:  D/C vanc and Flagyl.  Continue cefepime.  F/U sensitivity and clinical progress  Results for orders placed or performed during the hospital encounter of 12/22/23  Blood Culture ID Panel (Reflexed) (Collected: 12/22/2023  6:15 PM)  Result Value Ref Range   Enterococcus faecalis NOT DETECTED NOT DETECTED   Enterococcus Faecium NOT DETECTED NOT DETECTED   Listeria monocytogenes NOT DETECTED NOT DETECTED   Staphylococcus species NOT DETECTED NOT DETECTED   Staphylococcus aureus (BCID) NOT DETECTED NOT DETECTED   Staphylococcus epidermidis NOT DETECTED NOT DETECTED   Staphylococcus lugdunensis NOT DETECTED NOT DETECTED   Streptococcus species NOT DETECTED NOT DETECTED   Streptococcus agalactiae NOT DETECTED NOT DETECTED   Streptococcus pneumoniae NOT DETECTED NOT DETECTED   Streptococcus pyogenes NOT DETECTED NOT DETECTED   A.calcoaceticus-baumannii NOT DETECTED NOT DETECTED   Bacteroides fragilis NOT DETECTED NOT DETECTED   Enterobacterales NOT DETECTED NOT DETECTED   Enterobacter cloacae complex NOT DETECTED NOT DETECTED   Escherichia coli NOT DETECTED NOT DETECTED   Klebsiella aerogenes NOT DETECTED NOT DETECTED   Klebsiella oxytoca NOT DETECTED NOT DETECTED   Klebsiella pneumoniae NOT DETECTED NOT DETECTED   Proteus species NOT DETECTED NOT DETECTED   Salmonella species NOT DETECTED NOT DETECTED   Serratia marcescens NOT DETECTED NOT DETECTED   Haemophilus influenzae NOT DETECTED NOT DETECTED   Neisseria meningitidis NOT DETECTED NOT  DETECTED   Pseudomonas aeruginosa DETECTED (A) NOT DETECTED   Stenotrophomonas maltophilia NOT DETECTED NOT DETECTED   Candida albicans NOT DETECTED NOT DETECTED   Candida auris NOT DETECTED NOT DETECTED   Candida glabrata NOT DETECTED NOT DETECTED   Candida krusei NOT DETECTED NOT DETECTED   Candida parapsilosis NOT DETECTED NOT DETECTED   Candida tropicalis NOT DETECTED NOT DETECTED   Cryptococcus neoformans/gattii NOT DETECTED NOT DETECTED   CTX-M ESBL NOT DETECTED NOT DETECTED   Carbapenem resistance IMP NOT DETECTED NOT DETECTED   Carbapenem resistance KPC NOT DETECTED NOT DETECTED   Carbapenem resistance NDM NOT DETECTED NOT DETECTED   Carbapenem resistance VIM NOT DETECTED NOT DETECTED    Shandell Giovanni D. Lendell, PharmD, BCPS, BCCCP 12/23/2023, 6:47 PM

## 2023-12-23 NOTE — Progress Notes (Addendum)
 NAME:  Rhonda [name], MRN:  [id number], DOB:  [birth year], LOS: 1 ADMISSION DATE:  12/22/2023, CONSULTATION DATE:  11/5 REFERRING MD:  EDP, CHIEF COMPLAINT:  hypotension   History of Present Illness:  68 yo female presents to decreased state of consciousness. Pt reportedly normally alert and oriented x4. Today EMS was called as over the past 24 hours pt has been more lethargic. There is no history of known sick contact, no fever/chills, no ha/cp/dizziness. Pt's husband at bedside and provides history. States that she was in her normal state of health, activity level and mentation until this am. She awoke feeling sick but able to still mobilize and communicate as normal. As the day progressed pt began feeling nauseous, vomiting and diarrhea. She also became more lethargic and unable to speak with husband or communicate for which he contacted ems.   He states that she had a episode like this about 1 month ago and has had nephrostomy tube placed 2/2 hydronephrosis found 2/2 tumor mass. He endorses that abx no longer seem to work for her, he feels frustrated that she does not seem to be improving and that she continues to get sick. I attempted to provide support.   Pt is extremely mobile/restless in bed. Not following commands and non communicative. She does not appear to be in any pain just overall restless.   Ccm was asked to admit pt 2/2 hypotension and need for vasopressor support.   Pertinent  Medical History  Bladder cancer Nephrostomy tube Htn Hyperlipidemia Osteoporosis Ckd 2  Significant Hospital Events: Including procedures, antibiotic start and stop dates in addition to other pertinent events   Admitted to icu 11/5  Interim History / Subjective:  Admitted overnight Pressor requirements have significant improved.  Bicarb drip Remains quite encephalopathic, agitated.   Objective    Blood pressure 92/74, pulse (!) 110, temperature (!) 97.3 F (36.3 C), temperature  source Axillary, resp. rate (!) 27, height 5' 3 (1.6 m), weight 50.9 kg, SpO2 92%.        Intake/Output Summary (Last 24 hours) at 12/23/2023 0814 Last data filed at 12/23/2023 0700 Gross per 24 hour  Intake 4515.52 ml  Output 80 ml  Net 4435.52 ml   Filed Weights   12/22/23 1807 12/23/23 0500  Weight: 48.1 kg 50.9 kg    Examination: General: Thin adult female, acutely ill appearing. Very uncomfortable and squirming in the bed.  HENT: NCAT, PERRL, R scleral edema.  Lungs: Clear Cardiovascular: RRR, no MRG Abdomen: Soft, NT, ND Extremities: No acute deformity or edema Neuro: Moves all 4 extremities with good strength.  GU: deferred.   Resolved problem list   Assessment and Plan   Septic shock 2/2 presumed uti - NE infusion for MAP goal 65 - Can hopefully avoid central access - 4.5 L positive for volume resuscitation - Continue cefepime, flagyl, vanco pending culture - Stress dose steroids (chronic steroids)  AKI on CKD H/o bladder cancer Nephrostomy tube placement with hydronephrosis Hypomagensemia - nephrostomy tube output appears clear - renal indices improving this morning - No UOP. Bladder scan, may be retaining.  - 8 G mag given  Acute hypoxic resp failure - increasing oxygen requirement on 5L - repeat CXR - Hopefully not ALI  Lactic acidosis - improving  Metabolic encephalopathy: presumably secondary to UTI, sepsis. Ammonia 35. CT head negative.  - Supportive care - Treat sepsis - May need to assist with some comfort, maybe Precedex. Will try to hold off.   Transaminitis -  trend LFT  Possible sbo vs ileus - Small bowel series if clinical signs of obstruction.   hypoglycemia -trend CBG   Labs   CBC: Recent Labs  Lab 12/22/23 1813 12/23/23 0400  WBC 16.0* 19.5*  NEUTROABS 13.4*  --   HGB 11.4* 10.8*  HCT 37.2 32.6*  MCV 101.6* 95.0  PLT 191 151    Basic Metabolic Panel: Recent Labs  Lab 12/22/23 1813 12/23/23 0400  NA 139 136   K 4.2 3.9  CL 102 96*  CO2 11* 21*  GLUCOSE 57* 138*  BUN 57* 54*  CREATININE 4.28* 3.78*  CALCIUM 8.1* 7.3*  MG  --  1.1*  PHOS  --  6.7*   GFR: Estimated Creatinine Clearance: 11.4 mL/min (A) (by C-G formula based on SCr of 3.78 mg/dL (H)). Recent Labs  Lab 12/22/23 1813 12/22/23 1842 12/22/23 2227 12/23/23 0400  WBC 16.0*  --   --  19.5*  LATICACIDVEN  --  11.6* 5.8*  --     Liver Function Tests: Recent Labs  Lab 12/22/23 1813 12/23/23 0400  AST 110* 282*  ALT 36 98*  ALKPHOS 40 46  BILITOT 0.6 0.6  PROT 5.6* 4.9*  ALBUMIN 2.7* 2.2*   No results for input(s): LIPASE, AMYLASE in the last 168 hours. Recent Labs  Lab 12/22/23 2220  AMMONIA 35    ABG No results found for: PHART, PCO2ART, PO2ART, HCO3, TCO2, ACIDBASEDEF, O2SAT   Coagulation Profile: Recent Labs  Lab 12/22/23 1813  INR 1.4*    Cardiac Enzymes: No results for input(s): CKTOTAL, CKMB, CKMBINDEX, TROPONINI in the last 168 hours.  HbA1C: No results found for: HGBA1C  CBG: Recent Labs  Lab 12/22/23 2219 12/22/23 2241 12/23/23 0324 12/23/23 0800  GLUCAP 88 93 130* 122*    Review of Systems:   As per hpi  Past Medical History:  She,  has a past medical history of Diabetes mellitus without complication (HCC), bladder cancer, Hyperlipidemia, Hypertension, and Kidney transplant recipient.   Surgical History:   Past Surgical History:  Procedure Laterality Date   COLONOSCOPY     Kidney transplant 2001 N/A      Social History:   reports that she quit smoking about 40 years ago. Her smoking use included cigarettes. She has never used smokeless tobacco. She reports that she does not currently use alcohol. She reports that she does not use drugs.   Family History:  Her family history includes Breast cancer in her sister; Diabetes in her father; Heart disease in her mother and sister; Ovarian cancer in her sister; Stroke in her father. There is no history  of Colon cancer, Esophageal cancer, Pancreatic cancer, or Stomach cancer.   Allergies Allergies  Allergen Reactions   Oxycodone  Nausea Only   Penicillins Rash   Sulfa Antibiotics Rash     Home Medications  Prior to Admission medications   Medication Sig Start Date End Date Taking? Authorizing Provider  Cholecalciferol (VITAMIN D -3) 25 MCG (1000 UT) CAPS Take 1,000 capsules by mouth daily.    [provider]  cyanocobalamin (VITAMIN B12) 1000 MCG tablet Take 1,000 mcg by mouth every other day.    [provider]  lisinopril (ZESTRIL) 5 MG tablet TAKE 1 TABLET(5 MG) BY MOUTH DAILY Patient not taking: Reported on 12/21/2023 06/03/20   [provider]  lovastatin (MEVACOR) 20 MG tablet TAKE 1 TABLET(20 MG) BY MOUTH AT BEDTIME 06/03/20   [provider]  predniSONE (DELTASONE) 1 MG tablet TAKE 4 TABLETS(4 MG)  BY MOUTH DAILY 06/03/20   [provider]  tacrolimus (PROGRAF) 1 MG capsule TAKE 3 CAPSULES BY MOUTH EVERY 12 HOURS 03/11/20   [provider]     Critical care time: 39 min     Deward Eastern, AGACNP-BC [facility]  See Amion for personal pager PCCM on call pager [phone] until 7pm. Please call Elink 7p-7a. [phone]  12/23/2023 8:31 AM

## 2023-12-24 ENCOUNTER — Other Ambulatory Visit: Payer: Self-pay

## 2023-12-24 ENCOUNTER — Inpatient Hospital Stay (HOSPITAL_COMMUNITY)

## 2023-12-24 ENCOUNTER — Encounter (HOSPITAL_COMMUNITY): Payer: Self-pay | Admitting: Critical Care Medicine

## 2023-12-24 DIAGNOSIS — R0609 Other forms of dyspnea: Secondary | ICD-10-CM | POA: Diagnosis not present

## 2023-12-24 DIAGNOSIS — A419 Sepsis, unspecified organism: Secondary | ICD-10-CM | POA: Diagnosis not present

## 2023-12-24 DIAGNOSIS — N179 Acute kidney failure, unspecified: Secondary | ICD-10-CM | POA: Diagnosis not present

## 2023-12-24 DIAGNOSIS — N390 Urinary tract infection, site not specified: Secondary | ICD-10-CM | POA: Diagnosis not present

## 2023-12-24 DIAGNOSIS — R6521 Severe sepsis with septic shock: Secondary | ICD-10-CM | POA: Diagnosis not present

## 2023-12-24 LAB — COMPREHENSIVE METABOLIC PANEL WITH GFR
ALT: 117 U/L — ABNORMAL HIGH (ref 0–44)
AST: 244 U/L — ABNORMAL HIGH (ref 15–41)
Albumin: 2.3 g/dL — ABNORMAL LOW (ref 3.5–5.0)
Alkaline Phosphatase: 54 U/L (ref 38–126)
Anion gap: 17 — ABNORMAL HIGH (ref 5–15)
BUN: 57 mg/dL — ABNORMAL HIGH (ref 8–23)
CO2: 29 mmol/L (ref 22–32)
Calcium: 6.9 mg/dL — ABNORMAL LOW (ref 8.9–10.3)
Chloride: 91 mmol/L — ABNORMAL LOW (ref 98–111)
Creatinine, Ser: 2.56 mg/dL — ABNORMAL HIGH (ref 0.44–1.00)
GFR, Estimated: 20 mL/min — ABNORMAL LOW (ref >60)
Glucose, Bld: 152 mg/dL — ABNORMAL HIGH (ref 70–99)
Potassium: 4 mmol/L (ref 3.5–5.1)
Sodium: 137 mmol/L (ref 135–145)
Total Bilirubin: 0.8 mg/dL (ref 0.0–1.2)
Total Protein: 5 g/dL — ABNORMAL LOW (ref 6.5–8.1)

## 2023-12-24 LAB — PHOSPHORUS: Phosphorus: 6.1 mg/dL — ABNORMAL HIGH (ref 2.5–4.6)

## 2023-12-24 LAB — GLUCOSE, CAPILLARY
Glucose-Capillary: 143 mg/dL — ABNORMAL HIGH (ref 70–99)
Glucose-Capillary: 146 mg/dL — ABNORMAL HIGH (ref 70–99)
Glucose-Capillary: 149 mg/dL — ABNORMAL HIGH (ref 70–99)
Glucose-Capillary: 155 mg/dL — ABNORMAL HIGH (ref 70–99)
Glucose-Capillary: 166 mg/dL — ABNORMAL HIGH (ref 70–99)
Glucose-Capillary: 167 mg/dL — ABNORMAL HIGH (ref 70–99)
Glucose-Capillary: 184 mg/dL — ABNORMAL HIGH (ref 70–99)

## 2023-12-24 LAB — ECHOCARDIOGRAM COMPLETE
AR max vel: 2.27 cm2
AV Area VTI: 2.28 cm2
AV Area mean vel: 2.31 cm2
AV Mean grad: 3 mmHg
AV Peak grad: 4.9 mmHg
Ao pk vel: 1.11 m/s
Area-P 1/2: 3.13 cm2
Calc EF: 61.3 %
Height: 63 in
S' Lateral: 3 cm
Single Plane A2C EF: 65.8 %
Single Plane A4C EF: 58.4 %
Weight: 1806.01 [oz_av]

## 2023-12-24 LAB — CBC
HCT: 26 % — ABNORMAL LOW (ref 36.0–46.0)
Hemoglobin: 8.7 g/dL — ABNORMAL LOW (ref 12.0–15.0)
MCH: 31.8 pg (ref 26.0–34.0)
MCHC: 33.5 g/dL (ref 30.0–36.0)
MCV: 94.9 fL (ref 80.0–100.0)
Platelets: 71 K/uL — ABNORMAL LOW (ref 150–400)
RBC: 2.74 MIL/uL — ABNORMAL LOW (ref 3.87–5.11)
RDW: 13.6 % (ref 11.5–15.5)
WBC: 26.3 K/uL — ABNORMAL HIGH (ref 4.0–10.5)
nRBC: 0.1 % (ref 0.0–0.2)

## 2023-12-24 LAB — LACTIC ACID, PLASMA: Lactic Acid, Venous: 3.2 mmol/L (ref 0.5–1.9)

## 2023-12-24 LAB — MAGNESIUM: Magnesium: 4 mg/dL — ABNORMAL HIGH (ref 1.7–2.4)

## 2023-12-24 MED ORDER — DEXMEDETOMIDINE HCL IN NACL 400 MCG/100ML IV SOLN
0.0000 ug/kg/h | INTRAVENOUS | Status: DC
  Administered 2023-12-24: 0.7 ug/kg/h via INTRAVENOUS
  Administered 2023-12-24 (×2): 0.4 ug/kg/h via INTRAVENOUS
  Filled 2023-12-24 (×2): qty 100

## 2023-12-24 MED ORDER — SODIUM CHLORIDE 0.9 % IV SOLN
2.0000 g | INTRAVENOUS | Status: DC
  Administered 2023-12-24 – 2023-12-25 (×2): 2 g via INTRAVENOUS
  Filled 2023-12-24 (×2): qty 12.5

## 2023-12-24 MED ORDER — DEXMEDETOMIDINE HCL IN NACL 400 MCG/100ML IV SOLN
INTRAVENOUS | Status: AC
  Filled 2023-12-24: qty 100

## 2023-12-24 MED ORDER — INSULIN ASPART 100 UNIT/ML IJ SOLN
0.0000 [IU] | INTRAMUSCULAR | Status: DC
  Administered 2023-12-24: 2 [IU] via SUBCUTANEOUS
  Administered 2023-12-24: 3 [IU] via SUBCUTANEOUS
  Administered 2023-12-24 – 2023-12-26 (×7): 2 [IU] via SUBCUTANEOUS
  Filled 2023-12-24 (×4): qty 2
  Filled 2023-12-24: qty 5
  Filled 2023-12-24 (×3): qty 2

## 2023-12-24 MED ORDER — HALOPERIDOL LACTATE 5 MG/ML IJ SOLN
5.0000 mg | Freq: Four times a day (QID) | INTRAMUSCULAR | Status: DC | PRN
  Administered 2023-12-24 (×2): 5 mg via INTRAVENOUS
  Filled 2023-12-24 (×2): qty 1

## 2023-12-24 NOTE — Progress Notes (Signed)
 NAME:  Rhonda [name], MRN:  [id number], DOB:  [birth year], LOS: 2 ADMISSION DATE:  12/22/2023, CONSULTATION DATE:  11/5 REFERRING MD:  EDP, CHIEF COMPLAINT:  hypotension   History of Present Illness:  68 yo female presents to decreased state of consciousness. Pt reportedly normally alert and oriented x4. Today EMS was called as over the past 24 hours pt has been more lethargic. There is no history of known sick contact, no fever/chills, no ha/cp/dizziness. Pt's husband at bedside and provides history. States that she was in her normal state of health, activity level and mentation until this am. She awoke feeling sick but able to still mobilize and communicate as normal. As the day progressed pt began feeling nauseous, vomiting and diarrhea. She also became more lethargic and unable to speak with husband or communicate for which he contacted ems.   He states that she had a episode like this about 1 month ago and has had nephrostomy tube placed 2/2 hydronephrosis found 2/2 tumor mass. He endorses that abx no longer seem to work for her, he feels frustrated that she does not seem to be improving and that she continues to get sick. I attempted to provide support.   Pt is extremely mobile/restless in bed. Not following commands and non communicative. She does not appear to be in any pain just overall restless.   Ccm was asked to admit pt 2/2 hypotension and need for vasopressor support.   Pertinent  Medical History  Bladder cancer Nephrostomy tube Htn Hyperlipidemia Osteoporosis Ckd 2  Significant Hospital Events: Including procedures, antibiotic start and stop dates in addition to other pertinent events   11/5 Admitted to icu  11/6 Admitted overnight, Pressor requirements have significant improved. Remains quite encephalopathic, agitated.  11/7 drastic increase in supplemental oxygen demand overnight now on 60 FiO2 with 45 L nasal cannula.  Remains confused and agitated  Interim  History / Subjective:  Lying in bed on low-dose Precedex and Levophed Family updated at bedside  Objective    Blood pressure 126/83, pulse 80, temperature (!) 97.5 F (36.4 C), temperature source Oral, resp. rate (!) 22, height 5' 3 (1.6 m), weight 51.2 kg, SpO2 96%.    FiO2 (%):  [60 %] 60 %   Intake/Output Summary (Last 24 hours) at 12/24/2023 9078 Last data filed at 12/24/2023 0600 Gross per 24 hour  Intake 258.63 ml  Output 600 ml  Net -341.37 ml   Filed Weights   12/22/23 1807 12/23/23 0500 12/24/23 0500  Weight: 48.1 kg 50.9 kg 51.2 kg    Examination: General: Acute on chronic ill-appearing middle-aged female lying in bed encephalopathic HEENT: HHFNC in place, MM pink/moist, PERRL,  Neuro: Seen spontaneously moving all extremities, eyes spontaneously open but does not track movement or follow any commands CV: s1s2 regular rate and rhythm, no murmur, rubs, or gallops,  PULM: Diminished bilaterally, slight tachypnea, on HHFNC at 45 L GI: soft, bowel sounds active in all 4 quadrants, non-tender, non-distended Extremities: warm/dry, no edema  Skin: no rashes or lesions  Resolved problem list   Assessment and Plan   Severe septic shock secondary to Pseudomonas UTI with Pseudomonas bacteremia P: Follow cultures for sensitivity Continue cefepime Continue stress dose steroids while on vasopressors Continue Levophed for MAP goal greater than 65 Monitor urine output Hold further IV hydration given concern for volume overload, see below  Acute hypoxic respiratory failure - Patient seen with acute increase in supplemental oxygen demand overnight, now on 60% FiO2 with 45  L.  Chest x-ray concerning for volume overload versus lung injury P: Continue supplemental oxygen Low threshold to intubate for airway protection, family states patient would want aggressive interventions including intubation if needed Aspiration precaution Minimize sedation as able Antibiotics as  above  Concern for decreased cardiac function as evidenced by volume overload and increased oxygen requirement P: Check echocardiogram Minimize dehydration as able Hold on diuretic therapy at this time given significant AKI however if oxygen requirement worsens may need to consider diuretics  AKI on CKD H/o bladder cancer Nephrostomy tube placement with hydronephrosis Hypomagensemia P: Continue nephrostomy tube, monitor output Follow renal function  Monitor urine output Trend Bmet Avoid nephrotoxins Ensure adequate renal perfusion   Lactic acidosis - improved  P: Continue ongoing septic management as above  Metabolic encephalopathy -Presumably secondary to UTI, sepsis. Ammonia 35. CT head negative.  P: Minimize sedation as able Treatment for sepsis as above  Transaminitis P: Trend LFTs Management for sepsis as above Avoid hepatotoxins  Possible sbo vs ileus P: Continue supportive care, consider small bowel series if indicated  Hypoglycemia now with hyperglycemia in the setting of steroid use P: SSI CBG goal 140-180 CBG check every    Critical care time:  CRITICAL CARE Performed by: Tenee Wish D. Harris   Total critical care time: 40 minutes  Critical care time was exclusive of separately billable procedures and treating other patients.  Critical care was necessary to treat or prevent imminent or life-threatening deterioration.  Critical care was time spent personally by me on the following activities: development of treatment plan with patient and/or surrogate as well as nursing, discussions with consultants, evaluation of patient's response to treatment, examination of patient, obtaining history from patient or surrogate, ordering and performing treatments and interventions, ordering and review of laboratory studies, ordering and review of radiographic studies, pulse oximetry and re-evaluation of patient's condition.  Kameran Mcneese D. Harris, NP-C [facility] Personal contact information can be found on Amion  If no contact or response made please call [phone] 12/24/2023, 10:01 AM

## 2023-12-24 NOTE — Plan of Care (Signed)
  Problem: Safety: Goal: Non-violent Restraint(s) Outcome: Progressing   Problem: Education: Goal: Knowledge of General Education information will improve Description: Including pain rating scale, medication(s)/side effects and non-pharmacologic comfort measures Outcome: Progressing   Problem: Health Behavior/Discharge Planning: Goal: Ability to manage health-related needs will improve Outcome: Progressing   Problem: Clinical Measurements: Goal: Ability to maintain clinical measurements within normal limits will improve Outcome: Progressing Goal: Will remain free from infection Outcome: Progressing Goal: Diagnostic test results will improve Outcome: Progressing Goal: Respiratory complications will improve Outcome: Progressing Goal: Cardiovascular complication will be avoided Outcome: Progressing   Problem: Activity: Goal: Risk for activity intolerance will decrease Outcome: Progressing   Problem: Nutrition: Goal: Adequate nutrition will be maintained Outcome: Progressing   Problem: Coping: Goal: Level of anxiety will decrease Outcome: Progressing   Problem: Elimination: Goal: Will not experience complications related to bowel motility Outcome: Progressing Goal: Will not experience complications related to urinary retention Outcome: Progressing   Problem: Pain Managment: Goal: General experience of comfort will improve and/or be controlled Outcome: Progressing   Problem: Safety: Goal: Ability to remain free from injury will improve Outcome: Progressing   Problem: Skin Integrity: Goal: Risk for impaired skin integrity will decrease Outcome: Progressing   Problem: Education: Goal: Ability to describe self-care measures that may prevent or decrease complications (Diabetes Survival Skills Education) will improve Outcome: Progressing Goal: Individualized Educational Video(s) Outcome: Progressing   Problem: Coping: Goal: Ability to adjust to condition or change  in health will improve Outcome: Progressing   Problem: Fluid Volume: Goal: Ability to maintain a balanced intake and output will improve Outcome: Progressing   Problem: Health Behavior/Discharge Planning: Goal: Ability to identify and utilize available resources and services will improve Outcome: Progressing Goal: Ability to manage health-related needs will improve Outcome: Progressing   Problem: Metabolic: Goal: Ability to maintain appropriate glucose levels will improve Outcome: Progressing   Problem: Nutritional: Goal: Maintenance of adequate nutrition will improve Outcome: Progressing Goal: Progress toward achieving an optimal weight will improve Outcome: Progressing   Problem: Skin Integrity: Goal: Risk for impaired skin integrity will decrease Outcome: Progressing   Problem: Tissue Perfusion: Goal: Adequacy of tissue perfusion will improve Outcome: Progressing

## 2023-12-24 NOTE — Consult Note (Signed)
 WOC Nurse Consult Note: Reason for Consult: R forearm hematoma  Wound type: no open wound noted; purple discoloration noted from trauma  Pressure Injury POA: NA  Measurement: Wound bed: no open wound bed  Drainage (amount, consistency, odor)  Periwound: edema  Dressing procedure/placement/frequency: cleanse R forearm/hand with soap and water, dry and apply Xeroform gauze ([place] [phone]) to area daily, secure with loosely wrapped Kerlix and keep arm elevated.    POC discussed with bedside nurse. WOC team will not follow Re-consult if further needs arise.   Thank you,    Powell Bar MSN, RN-BC, [employer]

## 2023-12-24 NOTE — Progress Notes (Signed)
  Echocardiogram 2D Echocardiogram has been performed.  Norleen ORN [facility] 12/24/2023, 2:29 PM

## 2023-12-24 NOTE — Plan of Care (Signed)
  Problem: Safety: Goal: Non-violent Restraint(s) Outcome: Not Progressing   Problem: Education: Goal: Knowledge of General Education information will improve Description: Including pain rating scale, medication(s)/side effects and non-pharmacologic comfort measures Outcome: Not Progressing

## 2023-12-24 NOTE — Progress Notes (Signed)
 Patients oxygen saturation dropping to 86-88% on 15L HFNC. Attempted to NT suction patient with minimal secretions, wet sounding, not clearing secretions. Pt not tolerating HOB >30. RT at bedside and headed HFNC placed, Haldol given for agitation with minimal change. Started on Precedex.

## 2023-12-24 NOTE — Progress Notes (Signed)
 Tape was wrapped around the right lower arm, possibly due to a recent lab draw. Removal of the right mitten revealed swelling and a hematoma on the right lower arm and hand. The tape was removed, and the arm was elevated on pillows. Please refer to the wound assessment for photographs.

## 2023-12-24 NOTE — Progress Notes (Addendum)
 eLink Physician-Brief Progress Note Patient Name: Fleta Borgeson DOB: March 13, 1955 MRN: [id number]   Date of Service  12/24/2023  HPI/Events of Note  all over bed, concerned she may flip over side rail  eICU Interventions  Posey belt for safety   [phone] -patient is more hypoxic, tachypneic despite salter nasal cannula.  Still encephalopathic and trying to get out of bed making it difficult to aspirate upper airway secretions.  Add Haldol, as needed NT suctioning, and escalate to heated high flow nasal cannula  0405 -minimal response to Haldol.  On heated high flow nasal cannula, add low-dose Precedex.   Intervention Category Minor Interventions: Agitation / anxiety - evaluation and management  Nasreen Goedecke 12/24/2023, 12:21 AM

## 2023-12-24 NOTE — Progress Notes (Signed)
 Low blood pressure readings in the flowsheets were linked to the patient raising their arm above heart level or moving too much during measurement. When rechecked, blood pressures were normal while the patient was on levophed.

## 2023-12-25 ENCOUNTER — Encounter (HOSPITAL_COMMUNITY): Payer: Self-pay | Admitting: Critical Care Medicine

## 2023-12-25 DIAGNOSIS — A419 Sepsis, unspecified organism: Secondary | ICD-10-CM | POA: Diagnosis not present

## 2023-12-25 DIAGNOSIS — R739 Hyperglycemia, unspecified: Secondary | ICD-10-CM

## 2023-12-25 DIAGNOSIS — N390 Urinary tract infection, site not specified: Secondary | ICD-10-CM | POA: Diagnosis not present

## 2023-12-25 DIAGNOSIS — R6521 Severe sepsis with septic shock: Secondary | ICD-10-CM | POA: Diagnosis not present

## 2023-12-25 DIAGNOSIS — G9341 Metabolic encephalopathy: Secondary | ICD-10-CM | POA: Diagnosis not present

## 2023-12-25 DIAGNOSIS — E8729 Other acidosis: Secondary | ICD-10-CM

## 2023-12-25 LAB — POCT I-STAT 7, (LYTES, BLD GAS, ICA,H+H)
Acid-Base Excess: 9 mmol/L — ABNORMAL HIGH (ref 0.0–2.0)
Bicarbonate: 34.2 mmol/L — ABNORMAL HIGH (ref 20.0–28.0)
Calcium, Ion: 0.92 mmol/L — ABNORMAL LOW (ref 1.15–1.40)
HCT: 25 % — ABNORMAL LOW (ref 36.0–46.0)
Hemoglobin: 8.5 g/dL — ABNORMAL LOW (ref 12.0–15.0)
O2 Saturation: 100 %
Patient temperature: 97.5
Potassium: 3.5 mmol/L (ref 3.5–5.1)
Sodium: 134 mmol/L — ABNORMAL LOW (ref 135–145)
TCO2: 36 mmol/L — ABNORMAL HIGH (ref 22–32)
pCO2 arterial: 46.8 mmHg (ref 32–48)
pH, Arterial: 7.47 — ABNORMAL HIGH (ref 7.35–7.45)
pO2, Arterial: 157 mmHg — ABNORMAL HIGH (ref 83–108)

## 2023-12-25 LAB — CULTURE, BLOOD (ROUTINE X 2)
Special Requests: ADEQUATE
Special Requests: ADEQUATE

## 2023-12-25 LAB — GLUCOSE, CAPILLARY
Glucose-Capillary: 116 mg/dL — ABNORMAL HIGH (ref 70–99)
Glucose-Capillary: 120 mg/dL — ABNORMAL HIGH (ref 70–99)
Glucose-Capillary: 126 mg/dL — ABNORMAL HIGH (ref 70–99)
Glucose-Capillary: 126 mg/dL — ABNORMAL HIGH (ref 70–99)
Glucose-Capillary: 129 mg/dL — ABNORMAL HIGH (ref 70–99)
Glucose-Capillary: 137 mg/dL — ABNORMAL HIGH (ref 70–99)

## 2023-12-25 LAB — CBC
HCT: 26.1 % — ABNORMAL LOW (ref 36.0–46.0)
Hemoglobin: 8.5 g/dL — ABNORMAL LOW (ref 12.0–15.0)
MCH: 31.5 pg (ref 26.0–34.0)
MCHC: 32.6 g/dL (ref 30.0–36.0)
MCV: 96.7 fL (ref 80.0–100.0)
Platelets: 62 K/uL — ABNORMAL LOW (ref 150–400)
RBC: 2.7 MIL/uL — ABNORMAL LOW (ref 3.87–5.11)
RDW: 13.7 % (ref 11.5–15.5)
WBC: 19.1 K/uL — ABNORMAL HIGH (ref 4.0–10.5)
nRBC: 0.1 % (ref 0.0–0.2)

## 2023-12-25 LAB — HEPATIC FUNCTION PANEL
ALT: 252 U/L — ABNORMAL HIGH (ref 0–44)
AST: 385 U/L — ABNORMAL HIGH (ref 15–41)
Albumin: 2.2 g/dL — ABNORMAL LOW (ref 3.5–5.0)
Alkaline Phosphatase: 75 U/L (ref 38–126)
Bilirubin, Direct: 0.3 mg/dL — ABNORMAL HIGH (ref 0.0–0.2)
Indirect Bilirubin: 0.9 mg/dL (ref 0.3–0.9)
Total Bilirubin: 1.2 mg/dL (ref 0.0–1.2)
Total Protein: 5.3 g/dL — ABNORMAL LOW (ref 6.5–8.1)

## 2023-12-25 LAB — BASIC METABOLIC PANEL WITH GFR
Anion gap: 15 (ref 5–15)
BUN: 73 mg/dL — ABNORMAL HIGH (ref 8–23)
CO2: 29 mmol/L (ref 22–32)
Calcium: 6.7 mg/dL — ABNORMAL LOW (ref 8.9–10.3)
Chloride: 93 mmol/L — ABNORMAL LOW (ref 98–111)
Creatinine, Ser: 2.44 mg/dL — ABNORMAL HIGH (ref 0.44–1.00)
GFR, Estimated: 21 mL/min — ABNORMAL LOW (ref >60)
Glucose, Bld: 144 mg/dL — ABNORMAL HIGH (ref 70–99)
Potassium: 3.8 mmol/L (ref 3.5–5.1)
Sodium: 137 mmol/L (ref 135–145)

## 2023-12-25 LAB — MAGNESIUM: Magnesium: 3.7 mg/dL — ABNORMAL HIGH (ref 1.7–2.4)

## 2023-12-25 LAB — URINE CULTURE

## 2023-12-25 LAB — LACTIC ACID, PLASMA
Lactic Acid, Venous: 1.5 mmol/L (ref 0.5–1.9)
Lactic Acid, Venous: 1.3 mmol/L (ref 0.5–1.9)

## 2023-12-25 MED ORDER — DIPHENHYDRAMINE HCL 50 MG/ML IJ SOLN
25.0000 mg | Freq: Three times a day (TID) | INTRAMUSCULAR | Status: DC | PRN
Start: 2023-12-25 — End: 2023-12-29

## 2023-12-25 MED ORDER — PIPERACILLIN-TAZOBACTAM IN DEX 2-0.25 GM/50ML IV SOLN
2.2500 g | Freq: Three times a day (TID) | INTRAVENOUS | Status: DC
  Administered 2023-12-26: 2.25 g via INTRAVENOUS
  Filled 2023-12-25 (×2): qty 50

## 2023-12-25 NOTE — Progress Notes (Signed)
 NAME:  Rhonda [name], MRN:  [id number], DOB:  December 08, 1955, LOS: 3 ADMISSION DATE:  12/22/2023, CONSULTATION DATE:  11/5 REFERRING MD:  EDP, CHIEF COMPLAINT:  hypotension   History of Present Illness:  68 yo female presents to decreased state of consciousness. Pt reportedly normally alert and oriented x4. Today EMS was called as over the past 24 hours pt has been more lethargic. There is no history of known sick contact, no fever/chills, no ha/cp/dizziness. Pt's husband at bedside and provides history. States that she was in her normal state of health, activity level and mentation until this am. She awoke feeling sick but able to still mobilize and communicate as normal. As the day progressed pt began feeling nauseous, vomiting and diarrhea. She also became more lethargic and unable to speak with husband or communicate for which he contacted ems.   He states that she had a episode like this about 1 month ago and has had nephrostomy tube placed 2/2 hydronephrosis found 2/2 tumor mass. He endorses that abx no longer seem to work for her, he feels frustrated that she does not seem to be improving and that she continues to get sick. I attempted to provide support.   Pt is extremely mobile/restless in bed. Not following commands and non communicative. She does not appear to be in any pain just overall restless.   Ccm was asked to admit pt 2/2 hypotension and need for vasopressor support.   Pertinent  Medical History  Bladder cancer Nephrostomy tube Htn Hyperlipidemia Osteoporosis Ckd 2  Significant Hospital Events: Including procedures, antibiotic start and stop dates in addition to other pertinent events   11/5 Admitted to icu  11/6 Admitted overnight, Pressor requirements have significant improved. Remains quite encephalopathic, agitated.  11/7 drastic increase in supplemental oxygen demand overnight now on 60 FiO2 with 45 L nasal cannula.  Remains confused and agitated  Interim  History / Subjective:  Still encephalopathic, coming down on Precedex, still requiring levo, with low GFR, changed cefepime to Zosyn.  AKI on CKD CR 2.44, BUN 73, 250 cc UOP Otherwise On HFNC 45 L, 53%, Nephrostomy tube draining Perfusing well lactic acid 1.5  11/5 CT renal: Improved resolution of previously seen hydronephrosis, percutaneous nephrostomy in place Objective    Blood pressure (!) 79/49, pulse 61, temperature (!) 97.5 F (36.4 C), temperature source Axillary, resp. rate 16, height 5' 3 (1.6 m), weight 52 kg, SpO2 97%.    FiO2 (%):  [55 %-57 %] 57 %   Intake/Output Summary (Last 24 hours) at 12/25/2023 0758 Last data filed at 12/25/2023 0700 Gross per 24 hour  Intake 695.96 ml  Output 650 ml  Net 45.96 ml   Filed Weights   12/23/23 0500 12/24/23 0500 12/25/23 0430  Weight: 50.9 kg 51.2 kg 52 kg    Examination: General: Acute on chronic ill-appearing middle-aged female lying in bed encephalopathic HEENT: HHFNC in place, MM pink/moist, PERRL,  Neuro: Seen spontaneously moving all extremities, eyes spontaneously open but does not track movement or follow any commands CV: s1s2 regular rate and rhythm, no murmur, rubs, or gallops,  PULM: Diminished bilaterally, slight tachypnea, on HHFNC at 45 L GI: soft, bowel sounds active in all 4 quadrants, non-tender, non-distended Extremities: warm/dry, no edema  Skin: no rashes or lesions  Resolved problem list   Assessment and Plan   68-year F with history of bladder CA, prior renal transplant, admitted for septic shock due to complicated pseudomonal UTI and bacteremia  Still encephalopathic, coming  down on Precedex, still requiring levo, with low GFR, changed cefepime to Zosyn.  AKI on CKD CR 2.44, BUN 73, 250 cc UOP Otherwise On HFNC 45 L, 53%, Nephrostomy tube draining Perfusing well lactic acid 1.5  11/5 CT renal: Improved resolution of previously seen hydronephrosis, percutaneous nephrostomy in place   -Acute  metabolic encephalopathy, with agitated delirium -Septic shock - Lactic acidosis - Pseudomonal UTI and bacteremia - Acute hypoxic respiratory failure due to acute lung injury/early ARDS - Acute renal failure improving supportive care - Transaminitis - History of bladder CA with obstructive uropathy and nephrostomy tube in place - Hyperglycemia  Plan - Continue ICU monitoring and management - Antibiotics changed from cefepime to Zosyn due to AKI, - Wean vasopressors as tolerated to maintain MAP> 65 - Wean off sedation as tolerated - Wean off high flow as tolerated to maintain sats> 92% - Aggressive chest physio - Nephrostomy care     Lenny Drought, MD  [facility] Prefer epic messenger for cross cover needs   My critical care time: 40 minutes  Critical care time was exclusive of separately billable procedures and treating other patients.  Critical care was necessary to treat or prevent imminent or life-threatening deterioration.  Critical care was time spent personally by me on the following activities: development of treatment plan with patient and/or surrogate as well as nursing, discussions with consultants, evaluation of patient's response to treatment, examination of patient, obtaining history from patient or surrogate, ordering and performing treatments and interventions, ordering and review of laboratory studies, ordering and review of radiographic studies, pulse oximetry, re-evaluation of patient's condition and participation in multidisciplinary rounds.

## 2023-12-26 ENCOUNTER — Inpatient Hospital Stay (HOSPITAL_COMMUNITY)

## 2023-12-26 DIAGNOSIS — N390 Urinary tract infection, site not specified: Secondary | ICD-10-CM | POA: Diagnosis not present

## 2023-12-26 DIAGNOSIS — Z940 Kidney transplant status: Secondary | ICD-10-CM

## 2023-12-26 DIAGNOSIS — G9341 Metabolic encephalopathy: Secondary | ICD-10-CM | POA: Diagnosis not present

## 2023-12-26 DIAGNOSIS — R410 Disorientation, unspecified: Secondary | ICD-10-CM

## 2023-12-26 DIAGNOSIS — G40909 Epilepsy, unspecified, not intractable, without status epilepticus: Secondary | ICD-10-CM

## 2023-12-26 DIAGNOSIS — R6521 Severe sepsis with septic shock: Secondary | ICD-10-CM | POA: Diagnosis not present

## 2023-12-26 DIAGNOSIS — A419 Sepsis, unspecified organism: Secondary | ICD-10-CM | POA: Diagnosis not present

## 2023-12-26 DIAGNOSIS — T361X4A Poisoning by cephalosporins and other beta-lactam antibiotics, undetermined, initial encounter: Secondary | ICD-10-CM | POA: Diagnosis not present

## 2023-12-26 DIAGNOSIS — I6783 Posterior reversible encephalopathy syndrome: Secondary | ICD-10-CM

## 2023-12-26 LAB — BASIC METABOLIC PANEL WITH GFR
Anion gap: 19 — ABNORMAL HIGH (ref 5–15)
BUN: 72 mg/dL — ABNORMAL HIGH (ref 8–23)
CO2: 29 mmol/L (ref 22–32)
Calcium: 7.7 mg/dL — ABNORMAL LOW (ref 8.9–10.3)
Chloride: 93 mmol/L — ABNORMAL LOW (ref 98–111)
Creatinine, Ser: 1.91 mg/dL — ABNORMAL HIGH (ref 0.44–1.00)
GFR, Estimated: 28 mL/min — ABNORMAL LOW (ref >60)
Glucose, Bld: 112 mg/dL — ABNORMAL HIGH (ref 70–99)
Potassium: 2.8 mmol/L — ABNORMAL LOW (ref 3.5–5.1)
Sodium: 141 mmol/L (ref 135–145)

## 2023-12-26 LAB — GLUCOSE, CAPILLARY
Glucose-Capillary: 108 mg/dL — ABNORMAL HIGH (ref 70–99)
Glucose-Capillary: 116 mg/dL — ABNORMAL HIGH (ref 70–99)
Glucose-Capillary: 117 mg/dL — ABNORMAL HIGH (ref 70–99)
Glucose-Capillary: 126 mg/dL — ABNORMAL HIGH (ref 70–99)
Glucose-Capillary: 136 mg/dL — ABNORMAL HIGH (ref 70–99)
Glucose-Capillary: 142 mg/dL — ABNORMAL HIGH (ref 70–99)

## 2023-12-26 LAB — HEPATIC FUNCTION PANEL
ALT: 203 U/L — ABNORMAL HIGH (ref 0–44)
AST: 166 U/L — ABNORMAL HIGH (ref 15–41)
Albumin: 2.2 g/dL — ABNORMAL LOW (ref 3.5–5.0)
Alkaline Phosphatase: 111 U/L (ref 38–126)
Bilirubin, Direct: 0.2 mg/dL (ref 0.0–0.2)
Indirect Bilirubin: 1.2 mg/dL — ABNORMAL HIGH (ref 0.3–0.9)
Total Bilirubin: 1.4 mg/dL — ABNORMAL HIGH (ref 0.0–1.2)
Total Protein: 5.9 g/dL — ABNORMAL LOW (ref 6.5–8.1)

## 2023-12-26 LAB — CBC
HCT: 30.4 % — ABNORMAL LOW (ref 36.0–46.0)
Hemoglobin: 10 g/dL — ABNORMAL LOW (ref 12.0–15.0)
MCH: 31.1 pg (ref 26.0–34.0)
MCHC: 32.9 g/dL (ref 30.0–36.0)
MCV: 94.4 fL (ref 80.0–100.0)
Platelets: 56 K/uL — ABNORMAL LOW (ref 150–400)
RBC: 3.22 MIL/uL — ABNORMAL LOW (ref 3.87–5.11)
RDW: 13.5 % (ref 11.5–15.5)
WBC: 16.5 K/uL — ABNORMAL HIGH (ref 4.0–10.5)
nRBC: 0.1 % (ref 0.0–0.2)

## 2023-12-26 LAB — MAGNESIUM: Magnesium: 3.3 mg/dL — ABNORMAL HIGH (ref 1.7–2.4)

## 2023-12-26 LAB — AMMONIA: Ammonia: 19 umol/L (ref 9–35)

## 2023-12-26 MED ORDER — SODIUM CHLORIDE 0.9 % IV SOLN
75.0000 mL/h | INTRAVENOUS | Status: AC
  Administered 2023-12-26: 75 mL/h via INTRAVENOUS

## 2023-12-26 MED ORDER — METHYLPREDNISOLONE SODIUM SUCC 40 MG IJ SOLR
8.0000 mg | Freq: Every day | INTRAMUSCULAR | Status: DC
  Administered 2023-12-27: 8 mg via INTRAVENOUS
  Filled 2023-12-26: qty 1

## 2023-12-26 MED ORDER — ORAL CARE MOUTH RINSE
15.0000 mL | OROMUCOSAL | Status: DC
  Administered 2023-12-26 – 2023-12-28 (×18): 15 mL via OROMUCOSAL

## 2023-12-26 MED ORDER — LORAZEPAM 2 MG/ML IJ SOLN
2.0000 mg | Freq: Once | INTRAMUSCULAR | Status: AC
  Administered 2023-12-26: 2 mg via INTRAVENOUS
  Filled 2023-12-26: qty 1

## 2023-12-26 MED ORDER — LEVETIRACETAM (KEPPRA) 500 MG/5 ML ADULT IV PUSH
60.0000 mg/kg | Freq: Once | INTRAVENOUS | Status: AC
  Administered 2023-12-26: 3000 mg via INTRAVENOUS
  Filled 2023-12-26: qty 30

## 2023-12-26 MED ORDER — LORAZEPAM 2 MG/ML IJ SOLN
2.0000 mg | Freq: Once | INTRAMUSCULAR | Status: DC
Start: 2023-12-26 — End: 2023-12-29

## 2023-12-26 MED ORDER — LEVETIRACETAM (KEPPRA) 500 MG/5 ML ADULT IV PUSH
500.0000 mg | Freq: Two times a day (BID) | INTRAVENOUS | Status: DC
  Administered 2023-12-26: 500 mg via INTRAVENOUS
  Filled 2023-12-26: qty 5

## 2023-12-26 MED ORDER — PIPERACILLIN-TAZOBACTAM 3.375 G IVPB
3.3750 g | Freq: Three times a day (TID) | INTRAVENOUS | Status: AC
  Administered 2023-12-26 – 2023-12-31 (×17): 3.375 g via INTRAVENOUS
  Filled 2023-12-26 (×19): qty 50

## 2023-12-26 MED ORDER — INSULIN ASPART 100 UNIT/ML IJ SOLN
0.0000 [IU] | INTRAMUSCULAR | Status: DC
  Administered 2023-12-26: 1 [IU] via SUBCUTANEOUS
  Administered 2023-12-26 – 2023-12-27 (×3): 2 [IU] via SUBCUTANEOUS
  Administered 2023-12-27: 1 [IU] via SUBCUTANEOUS
  Administered 2023-12-27 – 2023-12-28 (×2): 2 [IU] via SUBCUTANEOUS
  Administered 2023-12-28: 3 [IU] via SUBCUTANEOUS
  Filled 2023-12-26: qty 3
  Filled 2023-12-26 (×4): qty 2
  Filled 2023-12-26: qty 1
  Filled 2023-12-26: qty 2
  Filled 2023-12-26: qty 1

## 2023-12-26 MED ORDER — POTASSIUM CHLORIDE 10 MEQ/100ML IV SOLN
10.0000 meq | INTRAVENOUS | Status: AC
  Administered 2023-12-26 (×4): 10 meq via INTRAVENOUS
  Filled 2023-12-26 (×4): qty 100

## 2023-12-26 NOTE — Progress Notes (Signed)
 vLTM started All impedances below 10k.  [facility] to monitor.

## 2023-12-26 NOTE — Consult Note (Shared)
 NEUROLOGY CONSULT NOTE   Date of service: December 26, 2023 Patient Name: Rhonda [name] MRN:  [id number] DOB:  1956/01/16 Chief Complaint: Altered mental status Requesting Provider: Sharie Bourbon, MD  History of Present Illness  Rhonda [name] is a 68 y.o. female with hx of diabetes, bladder cancer status post nephrostomy tube, hypertension, hyperlipidemia and chronic kidney disease with prior kidney transplant who originally presented with altered mental status and lethargy and was found to have pseudomonal UTI.  She was initially placed on antibiotics, including cefepime. She remains in the ICU requiring oxygen by high flow nasal cannula.  She has been hypotensive and has required pressors during her ICU stay.  EEG was performed today to further assess her altered mental status, and it revealed nonconvulsive status epilepticus.  ROS   Unable to ascertain due to altered mental status  Past History   Past Medical History:  Diagnosis Date   CKD (chronic kidney disease), stage III (HCC)    DM (diabetes mellitus), type 2 (HCC)    H/O transurethral resection of bladder tumor (TURBT)    Hyperlipidemia    Hypertension    Kidney transplant recipient    vesicoureteral reflux   Microcytic anemia    Osteoporosis    Urothelial cancer (HCC)     Past Surgical History:  Procedure Laterality Date   COLONOSCOPY     Kidney transplant 2001 N/A    TRANSURETHRAL RESECTION OF BLADDER TUMOR WITH GYRUS (TURBT-GYRUS)  2025   2008 & 2025    Family History: Family History  Problem Relation Age of Onset   Heart disease Mother    Diabetes Father    Stroke Father    Heart disease Sister    Breast cancer Sister    Ovarian cancer Sister    Colon cancer Neg Hx    Esophageal cancer Neg Hx    Pancreatic cancer Neg Hx    Stomach cancer Neg Hx     Social History  reports that she quit smoking about 40 years ago. Her smoking use included cigarettes. She has never used smokeless  tobacco. She reports that she does not currently use alcohol. She reports that she does not use drugs.  Allergies  Allergen Reactions   Penicillins Rash   Sulfa Antibiotics Rash    Medications   Current Facility-Administered Medications:    0.9 %  sodium chloride  infusion, 75 mL/hr, Intravenous, Continuous, Hussein, Abdullahi, MD   Chlorhexidine Gluconate Cloth 2 % PADS 6 each, 6 each, Topical, Daily, Ilah Krabbe M, PA-C, 6 each at 12/25/23 2300   diphenhydrAMINE  (BENADRYL ) injection 25 mg, 25 mg, Intravenous, Q8H PRN, Sharie Bourbon, MD   docusate sodium (COLACE) capsule 100 mg, 100 mg, Oral, BID PRN, Ilah Krabbe M, PA-C   haloperidol lactate (HALDOL) injection 5 mg, 5 mg, Intravenous, Q6H PRN, Paliwal, Aditya, MD, 5 mg at 12/24/23 2338   heparin injection 5,000 Units, 5,000 Units, Subcutaneous, Q8H, Ilah Krabbe M, PA-C, 5,000 Units at 12/26/23 0749   insulin aspart (novoLOG) injection 0-9 Units, 0-9 Units, Subcutaneous, Q4H, Hussein, Bourbon, MD   levETIRAcetam (KEPPRA) undiluted injection 3,000 mg, 60 mg/kg, Intravenous, Once, Hussein, Bourbon, MD   levETIRAcetam (KEPPRA) undiluted injection 500 mg, 500 mg, Intravenous, Q12H, Hussein, Bourbon, MD   LORazepam (ATIVAN) injection 2 mg, 2 mg, Intravenous, Once, Sharie Bourbon, MD   NOREEN ON 12/27/2023] methylPREDNISolone sodium succinate (SOLU-MEDROL) 40 mg/mL injection 8 mg, 8 mg, Intravenous, Daily, Hussein, Abdullahi, MD   ondansetron  (ZOFRAN ) injection 4 mg, 4 mg,  Intravenous, Q6H PRN, Ilah Corean HERO, PA-C   Oral care mouth rinse, 15 mL, Mouth Rinse, PRN, Layman Raisin, DO   Oral care mouth rinse, 15 mL, Mouth Rinse, Q2H, Hussein, Abdullahi, MD   piperacillin-tazobactam (ZOSYN) IVPB 3.375 g, 3.375 g, Intravenous, Q8H, Hussein, Abdullahi, MD   polyethylene glycol (MIRALAX / GLYCOLAX) packet 17 g, 17 g, Oral, Daily PRN, Ilah Corean HERO, [place]  Vitals   Vitals:   12/26/23 1100 12/26/23 1103  12/26/23 1200 12/26/23 1256  BP: (!) 139/101  (!) 144/97   Pulse: 93  95 93  Resp: 19  (!) 27 (!) 22  Temp:  (!) 97.2 F (36.2 C)    TempSrc:  Axillary    SpO2: 96%  94% 97%  Weight:      Height:        Body mass index is 20.31 kg/m.   Physical Exam   Constitutional: Ill-appearing elderly patient in no acute distress Eyes: No scleral injection.  HENT: No OP obstruction.  Head: Normocephalic.  Cardiovascular: Normal rate and regular rhythm.  Respiratory: Mildly labored breathing on supplemental O2 Skin: WDI.   Neurologic Examination  Exam performed shortly after patient received lorazepam  Mental Status: Patient rests with eyes closed and does not respond to name or follow commands.  She is able to localize sternal rub with left upper extremity. Speech/Language: No verbal output Cranial Nerves:  II: PERRL.  III, IV, VI: Does not focus or track examiner VII: Face is symmetrical at rest XII: Not cooperative with tongue protrusion Motor: Moves left upper extremity to localize sternal rub with antigravity strength, does not move right upper extremity but per RN was moving it with antigravity strength earlier in the day, withdraws bilateral lower extremities to noxious plantar stimulation Tone is normal and bulk is normal for age Sensation- Intact to noxious throughout Coordination: Unable to perform Gait- Deferred   Labs/Imaging/Neurodiagnostic studies   CBC:  Recent Labs  Lab 01-16-24 1813 12/23/23 0400 12/25/23 0258 12/25/23 0929 12/26/23 0253  WBC 16.0*   < > 19.1*  --  16.5*  NEUTROABS 13.4*  --   --   --   --   HGB 11.4*   < > 8.5* 8.5* 10.0*  HCT 37.2   < > 26.1* 25.0* 30.4*  MCV 101.6*   < > 96.7  --  94.4  PLT 191   < > 62*  --  56*   < > = values in this interval not displayed.   Basic Metabolic Panel:  Lab Results  Component Value Date   NA 141 12/26/2023   K 2.8 (L) 12/26/2023   CO2 29 12/26/2023   GLUCOSE 112 (H) 12/26/2023   BUN 72 (H)  12/26/2023   CREATININE 1.91 (H) 12/26/2023   CALCIUM 7.7 (L) 12/26/2023   GFRNONAA 28 (L) 12/26/2023   Lipid Panel: No results found for: LDLCALC HgbA1c: No results found for: HGBA1C Urine Drug Screen: No results found for: LABOPIA, COCAINSCRNUR, LABBENZ, AMPHETMU, THCU, LABBARB  Alcohol Level No results found for: [facility] INR  Lab Results  Component Value Date   INR 1.4 (H) 01/16/24     ASSESSMENT  Rhonda [name] is a 68 y.o. female with hx of diabetes, bladder cancer status post nephrostomy tube, hypertension, hyperlipidemia and chronic kidney disease with prior kidney transplant who presented with altered mental status and was found to have UTI.  She was treated with antibiotics, including cefepime.  She was also hypotensive, requiring pressors and has required  oxygen via high flow nasal cannula.  EEG was connected to investigate altered mental status, revealing nonconvulsive status epilepticus. - Exam reveals an obtunded patient post Ativan who is able to localize noxious stimuli - Initial LTM EEG tracings are most consistent with non-convulsive status epilepticus. Ativan 2 mg IV and Keppra 3000 mg IV have been administered.  - CT head: No acute intracranial abnormality. Right mastoid effusion and paranasal sinus inflammatory changes. No obstructing nasopharyngeal lesion identified.  - Labs:  - Significant derangements on CMP, including low ionized Ca of 0.92 and uremia, which could lower the seizure threshold and/or provoke seizures - Elevated Cr, consistent with AKI. 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 - DDx for her nonconvulsive status epilepticus includes cefepime neurotoxicity. Per Dr. Shelton, the EEG tracings appear typical for this. Also on DDx is  PRES.  - Her AMS is best explained by the nonconvulsive status epilepticus which is confirmed on EEG. Cefepime neurotoxicity also is likely to be playing a role. Most likely there is also an underlying metabolic and/or infectious component to her encephalopathy, and ICU delirium may also be playing a role.  - History of renal transplant, on tacrolimus. This raises the possibility of PRES as the underlying etiology   RECOMMENDATIONS  - Continue Keppra at 500 mg IV BID.  - Continue LTM EEG - MRI brain WITHOUT contrast when able, to assess for possible PRES. DO NOT USE CONTRAST.  - Avoid cefepime - Delirium precautions - Treatment of causes of toxic metabolic encephalopathy per primary team - Nephrology consult given that she is a renal transplant patient with AKI. - Renal protective measures.  - CCM has discussed with husband current plan by them to hold tacrolimus given multi-organ failure and prevent rejection with steroids.  - Our assessment and recommendations have been discussed with CCM attending - Education provided to husband at the bedside - Night Neurology team to review her LTM EEG to ensure no seizure recurrence. I have signed this out to the night team.  - Neurology will continue to follow ______________________________________________________________________  Patient seen by NP and MD. Earle FORBES Everitt Clint Abbey , MSN, AGACNP-BC [facility] See Amion for schedule and pager information 12/26/2023 2:46 PM    Signed, MERRIANNE Percy Comp, MD [facility]

## 2023-12-26 NOTE — Progress Notes (Signed)
 NAME:  Rhonda [name], MRN:  [id number], DOB:  May 01, 1955, LOS: 4 ADMISSION DATE:  12/22/2023, CONSULTATION DATE:  11/5 REFERRING MD:  EDP, CHIEF COMPLAINT:  hypotension   History of Present Illness:  68 yo female presents to decreased state of consciousness. Pt reportedly normally alert and oriented x4. Today EMS was called as over the past 24 hours pt has been more lethargic. There is no history of known sick contact, no fever/chills, no ha/cp/dizziness. Pt's husband at bedside and provides history. States that she was in her normal state of health, activity level and mentation until this am. She awoke feeling sick but able to still mobilize and communicate as normal. As the day progressed pt began feeling nauseous, vomiting and diarrhea. She also became more lethargic and unable to speak with husband or communicate for which he contacted ems.   He states that she had a episode like this about 1 month ago and has had nephrostomy tube placed 2/2 hydronephrosis found 2/2 tumor mass. He endorses that abx no longer seem to work for her, he feels frustrated that she does not seem to be improving and that she continues to get sick. I attempted to provide support.   Pt is extremely mobile/restless in bed. Not following commands and non communicative. She does not appear to be in any pain just overall restless.   Ccm was asked to admit pt 2/2 hypotension and need for vasopressor support.   Pertinent  Medical History  Bladder cancer Nephrostomy tube Htn Hyperlipidemia Osteoporosis Ckd 2  Significant Hospital Events: Including procedures, antibiotic start and stop dates in addition to other pertinent events   11/5 Admitted to icu  11/6 Admitted overnight, Pressor requirements have significant improved. Remains quite encephalopathic, agitated.  11/7 drastic increase in supplemental oxygen demand overnight now on 60 FiO2 with 45 L nasal cannula.  Remains confused and agitated  Interim  History / Subjective:  Still encephalopathic-change cefepime to Zosyn on 11/9, ammonia levels pending cEEG-showing subclinical seizures-gave 2 of Ativan stopped, Keppra loaded, and to continue Keppra twice daily.  Neurology team brought on board  Objective    Blood pressure (!) 138/95, pulse 92, temperature 98.4 F (36.9 C), temperature source Axillary, resp. rate (!) 27, height 5' 3 (1.6 m), weight 52 kg, SpO2 98%.    FiO2 (%):  [55 %-56 %] 55 %   Intake/Output Summary (Last 24 hours) at 12/26/2023 0746 Last data filed at 12/26/2023 0230 Gross per 24 hour  Intake 117.98 ml  Output 2350 ml  Net -2232.02 ml   Filed Weights   12/23/23 0500 12/24/23 0500 12/25/23 0430  Weight: 50.9 kg 51.2 kg 52 kg    Examination: GEN: Critically ill lying in bed encephalopathic HEENT: NCAT Neuro: Spontaneously moving extremities, eyes spontaneously open but does not track movement.  Does not follow commands. CVS: S1-S2 normal, RRR, no murmur or rubs Pulm: Symmetrical chest wall movement, clear to auscultation, on HHFNC GI: Soft nontender nondistended Extremity, warm and dry   Resolved problem list   Assessment and Plan   68-year F with history of bladder CA, prior renal transplant, admitted for septic shock due to complicated pseudomonal UTI and bacteremia  Encephalopathic-continuous EEG showing subclinical seizures-s/p 2 of Ativan, Keppra loaded, to continue with Keppra twice daily.  Neurology team brought on board Making adequate UOP     11/5 CT renal: Improved resolution of previously seen hydronephrosis, percutaneous nephrostomy in place   -Acute metabolic encephalopathy-likely due to subclinical seizures -Septic shock -  Lactic acidosis - Pseudomonal UTI and bacteremia - Acute hypoxic respiratory failure due to acute lung injury/early ARDS - Acute renal failure improving supportive care - Transaminitis - History of bladder CA with obstructive uropathy and nephrostomy tube in  place - Hyperglycemia  Plan - Continue ICU monitoring and management - Keppra loaded, continue Keppra twice daily, neurology team on board - Antibiotics changed from cefepime to Zosyn on 11/8 due to AKI and subclinical seizures - Wean vasopressors as tolerated to maintain MAP> 65 - Wean off sedation as tolerated - Wean off high flow as tolerated to maintain sats> 92% - Aggressive chest physio - Nephrostomy care     Lenny Drought, MD  [facility] Prefer epic messenger for cross cover needs   My critical care time: 35 minutes  Critical care time was exclusive of separately billable procedures and treating other patients.  Critical care was necessary to treat or prevent imminent or life-threatening deterioration.  Critical care was time spent personally by me on the following activities: development of treatment plan with patient and/or surrogate as well as nursing, discussions with consultants, evaluation of patient's response to treatment, examination of patient, obtaining history from patient or surrogate, ordering and performing treatments and interventions, ordering and review of laboratory studies, ordering and review of radiographic studies, pulse oximetry, re-evaluation of patient's condition and participation in multidisciplinary rounds.

## 2023-12-26 NOTE — Progress Notes (Signed)
 eLink Physician-Brief Progress Note Patient Name: Rhonda [name] DOB: [birth year] MRN: [id number]   Date of Service  12/26/2023  HPI/Events of Note  Hypokalemia.  eICU Interventions  Replacement ordered.     Intervention Category Minor Interventions: Electrolytes abnormality - evaluation and management  Jerilynn Berg 12/26/2023, 5:57 AM

## 2023-12-27 ENCOUNTER — Inpatient Hospital Stay (HOSPITAL_COMMUNITY)

## 2023-12-27 DIAGNOSIS — G40909 Epilepsy, unspecified, not intractable, without status epilepticus: Secondary | ICD-10-CM | POA: Diagnosis not present

## 2023-12-27 DIAGNOSIS — G928 Other toxic encephalopathy: Secondary | ICD-10-CM | POA: Diagnosis not present

## 2023-12-27 DIAGNOSIS — G40901 Epilepsy, unspecified, not intractable, with status epilepticus: Secondary | ICD-10-CM | POA: Diagnosis not present

## 2023-12-27 DIAGNOSIS — R4182 Altered mental status, unspecified: Secondary | ICD-10-CM | POA: Diagnosis not present

## 2023-12-27 DIAGNOSIS — A419 Sepsis, unspecified organism: Secondary | ICD-10-CM | POA: Diagnosis not present

## 2023-12-27 DIAGNOSIS — N390 Urinary tract infection, site not specified: Secondary | ICD-10-CM | POA: Diagnosis not present

## 2023-12-27 DIAGNOSIS — E46 Unspecified protein-calorie malnutrition: Secondary | ICD-10-CM

## 2023-12-27 DIAGNOSIS — T361X5A Adverse effect of cephalosporins and other beta-lactam antibiotics, initial encounter: Secondary | ICD-10-CM

## 2023-12-27 DIAGNOSIS — J9601 Acute respiratory failure with hypoxia: Secondary | ICD-10-CM | POA: Diagnosis not present

## 2023-12-27 DIAGNOSIS — N179 Acute kidney failure, unspecified: Secondary | ICD-10-CM | POA: Diagnosis not present

## 2023-12-27 DIAGNOSIS — E43 Unspecified severe protein-calorie malnutrition: Secondary | ICD-10-CM

## 2023-12-27 LAB — MAGNESIUM: Magnesium: 2.7 mg/dL — ABNORMAL HIGH (ref 1.7–2.4)

## 2023-12-27 LAB — BASIC METABOLIC PANEL WITH GFR
Anion gap: 16 — ABNORMAL HIGH (ref 5–15)
Anion gap: 18 — ABNORMAL HIGH (ref 5–15)
BUN: 71 mg/dL — ABNORMAL HIGH (ref 8–23)
BUN: 61 mg/dL — ABNORMAL HIGH (ref 8–23)
CO2: 29 mmol/L (ref 22–32)
CO2: 29 mmol/L (ref 22–32)
Calcium: 7.7 mg/dL — ABNORMAL LOW (ref 8.9–10.3)
Calcium: 8.4 mg/dL — ABNORMAL LOW (ref 8.9–10.3)
Chloride: 97 mmol/L — ABNORMAL LOW (ref 98–111)
Chloride: 100 mmol/L (ref 98–111)
Creatinine, Ser: 1.69 mg/dL — ABNORMAL HIGH (ref 0.44–1.00)
Creatinine, Ser: 1.59 mg/dL — ABNORMAL HIGH (ref 0.44–1.00)
GFR, Estimated: 33 mL/min — ABNORMAL LOW (ref >60)
GFR, Estimated: 35 mL/min — ABNORMAL LOW (ref >60)
Glucose, Bld: 128 mg/dL — ABNORMAL HIGH (ref 70–99)
Glucose, Bld: 142 mg/dL — ABNORMAL HIGH (ref 70–99)
Potassium: 2.9 mmol/L — ABNORMAL LOW (ref 3.5–5.1)
Potassium: 4.4 mmol/L (ref 3.5–5.1)
Sodium: 142 mmol/L (ref 135–145)
Sodium: 147 mmol/L — ABNORMAL HIGH (ref 135–145)

## 2023-12-27 LAB — CBC
HCT: 31.1 % — ABNORMAL LOW (ref 36.0–46.0)
Hemoglobin: 10.1 g/dL — ABNORMAL LOW (ref 12.0–15.0)
MCH: 30.9 pg (ref 26.0–34.0)
MCHC: 32.5 g/dL (ref 30.0–36.0)
MCV: 95.1 fL (ref 80.0–100.0)
Platelets: 55 K/uL — ABNORMAL LOW (ref 150–400)
RBC: 3.27 MIL/uL — ABNORMAL LOW (ref 3.87–5.11)
RDW: 13.9 % (ref 11.5–15.5)
WBC: 9.6 K/uL (ref 4.0–10.5)
nRBC: 0.3 % — ABNORMAL HIGH (ref 0.0–0.2)

## 2023-12-27 LAB — PHOSPHORUS: Phosphorus: 4.5 mg/dL (ref 2.5–4.6)

## 2023-12-27 LAB — DIC (DISSEMINATED INTRAVASCULAR COAGULATION)PANEL
D-Dimer, Quant: 18.01 ug{FEU}/mL — ABNORMAL HIGH (ref 0.00–0.50)
Fibrinogen: 744 mg/dL — ABNORMAL HIGH (ref 210–475)
INR: 1.2 (ref 0.8–1.2)
Platelets: 63 K/uL — ABNORMAL LOW (ref 150–400)
Prothrombin Time: 16 s — ABNORMAL HIGH (ref 11.4–15.2)
Smear Review: NONE SEEN
aPTT: 52 s — ABNORMAL HIGH (ref 24–36)

## 2023-12-27 LAB — GLUCOSE, CAPILLARY
Glucose-Capillary: 116 mg/dL — ABNORMAL HIGH (ref 70–99)
Glucose-Capillary: 119 mg/dL — ABNORMAL HIGH (ref 70–99)
Glucose-Capillary: 126 mg/dL — ABNORMAL HIGH (ref 70–99)
Glucose-Capillary: 136 mg/dL — ABNORMAL HIGH (ref 70–99)
Glucose-Capillary: 164 mg/dL — ABNORMAL HIGH (ref 70–99)
Glucose-Capillary: 184 mg/dL — ABNORMAL HIGH (ref 70–99)

## 2023-12-27 LAB — TACROLIMUS LEVEL: Tacrolimus (FK506) - LabCorp: 1.2 ng/mL — ABNORMAL LOW (ref 5.0–20.0)

## 2023-12-27 LAB — LACTATE DEHYDROGENASE: LDH: 465 U/L — ABNORMAL HIGH (ref 98–192)

## 2023-12-27 MED ORDER — GADOBUTROL 1 MMOL/ML IV SOLN
5.0000 mL | Freq: Once | INTRAVENOUS | Status: AC | PRN
  Administered 2023-12-27: 5 mL via INTRAVENOUS

## 2023-12-27 MED ORDER — POTASSIUM CHLORIDE 10 MEQ/100ML IV SOLN
10.0000 meq | INTRAVENOUS | Status: AC
  Administered 2023-12-27 (×8): 10 meq via INTRAVENOUS
  Filled 2023-12-27 (×8): qty 100

## 2023-12-27 MED ORDER — ADULT MULTIVITAMIN W/MINERALS CH
1.0000 | ORAL_TABLET | Freq: Every day | ORAL | Status: DC
  Administered 2023-12-27 – 2024-01-04 (×9): 1 via ORAL
  Filled 2023-12-27 (×9): qty 1

## 2023-12-27 MED ORDER — OSMOLITE 1.2 CAL PO LIQD
1000.0000 mL | ORAL | Status: DC
  Administered 2023-12-27 – 2023-12-29 (×3): 1000 mL
  Filled 2023-12-27 (×5): qty 1000

## 2023-12-27 MED ORDER — SODIUM CHLORIDE 0.9 % IV SOLN
200.0000 mg | Freq: Once | INTRAVENOUS | Status: AC
  Administered 2023-12-27: 200 mg via INTRAVENOUS
  Filled 2023-12-27: qty 20

## 2023-12-27 MED ORDER — SODIUM CHLORIDE 0.9 % IV SOLN
50.0000 mg | Freq: Two times a day (BID) | INTRAVENOUS | Status: DC
  Administered 2023-12-27 – 2023-12-31 (×9): 50 mg via INTRAVENOUS
  Filled 2023-12-27 (×10): qty 5

## 2023-12-27 MED ORDER — LEVETIRACETAM (KEPPRA) 500 MG/5 ML ADULT IV PUSH
1000.0000 mg | Freq: Two times a day (BID) | INTRAVENOUS | Status: DC
  Administered 2023-12-27: 1000 mg via INTRAVENOUS
  Filled 2023-12-27: qty 10

## 2023-12-27 MED ORDER — LEVETIRACETAM (KEPPRA) 500 MG/5 ML ADULT IV PUSH
500.0000 mg | Freq: Two times a day (BID) | INTRAVENOUS | Status: DC
  Administered 2023-12-27 – 2024-01-01 (×10): 500 mg via INTRAVENOUS
  Filled 2023-12-27 (×12): qty 5

## 2023-12-27 MED ORDER — PREDNISONE 10 MG PO TABS
10.0000 mg | ORAL_TABLET | Freq: Every day | ORAL | Status: DC
  Administered 2023-12-28 – 2023-12-29 (×2): 10 mg
  Filled 2023-12-27 (×2): qty 1

## 2023-12-27 NOTE — Progress Notes (Signed)
 Subjective: No further seizure-like activity.  Husband at bedside.  Denies any previous history of seizures.  ROS: Unable to obtain due to poor mental status  Examination  Vital signs in last 24 hours: Temp:  [97.6 F (36.4 C)-99 F (37.2 C)] 97.9 F (36.6 C) (11/10 1517) Pulse Rate:  [82-98] 97 (11/10 1500) Resp:  [12-22] 14 (11/10 1500) BP: (103-149)/(72-108) 147/108 (11/10 1500) SpO2:  [94 %-100 %] 100 % (11/10 1500) FiO2 (%):  [30 %-32 %] 30 % (11/10 1000) Weight:  [46.3 kg] 46.3 kg (11/10 0301)  General: lying in bed, not in apparent distress Neuro: Opens eyes to repeated noxious stimulation, follows simple one-step commands like sticking out her tongue and wiggling her toes, PERRLA, no forced gaze deviation, no apparent facial asymmetry, withdraws to noxious stimuli in all 4 extremities  Basic Metabolic Panel: Recent Labs  Lab 12/23/23 0400 12/23/23 1846 12/24/23 0500 12/25/23 0258 12/25/23 0929 12/26/23 0253 12/27/23 0157 12/27/23 0158  NA 136 135 137 137 134* 141  --  142  K 3.9 3.6 4.0 3.8 3.5 2.8*  --  2.9*  CL 96* 91* 91* 93*  --  93*  --  97*  CO2 21* 29 29 29   --  29  --  29  GLUCOSE 138* 189* 152* 144*  --  112*  --  128*  BUN 54* 58* 57* 73*  --  72*  --  71*  CREATININE 3.78* 2.90* 2.56* 2.44*  --  1.91*  --  1.69*  CALCIUM 7.3* 6.9* 6.9* 6.7*  --  7.7*  --  7.7*  MG 1.1* 4.1* 4.0* 3.7*  --  3.3*  --  2.7*  PHOS 6.7*  --  6.1*  --   --   --  4.5  --     CBC: Recent Labs  Lab 12/22/23 1813 12/23/23 0400 12/24/23 1030 12/25/23 0258 12/25/23 0929 12/26/23 0253 12/27/23 0158 12/27/23 1600  WBC 16.0* 19.5* 26.3* 19.1*  --  16.5* 9.6  --   NEUTROABS 13.4*  --   --   --   --   --   --   --   HGB 11.4* 10.8* 8.7* 8.5* 8.5* 10.0* 10.1*  --   HCT 37.2 32.6* 26.0* 26.1* 25.0* 30.4* 31.1*  --   MCV 101.6* 95.0 94.9 96.7  --  94.4 95.1  --   PLT 191 151 71* 62*  --  56* 55* 63*     Coagulation Studies: Recent Labs    12/27/23 1600  LABPROT  PENDING  INR PENDING    Imaging personally reviewed  CT head without contrast 12/22/2023: No acute intracranial abnormality. Right mastoid effusion and paranasal sinus inflammatory changes. No obstructing nasopharyngeal lesion identified.   ASSESSMENT AND PLAN: 68 year old female presented with altered mental status and was found to have UTI.  She was initially treated with cefepime which had since been switched to Zosyn.  Nonconvulsive status epilepticus, resolved  Acute encephalopathy, likely toxic-metabolic -Patient most likely had nonconvulsive status epilepticus in the setting of cefepime use in a patient with poor renal function - Due to poor renal function, recommend Keppra 500 mg twice daily - Also start Vimpat 50 mg twice daily - Will obtain MRI brain without contrast to look for any acute abnormality - Continue EEG till no further seizures - Discussed plan with husband at bedside and ICU team via secure chat    I personally spent a total of 45 minutes in the care of the patient  today including getting/reviewing separately obtained history, performing a medically appropriate exam/evaluation, counseling and educating, placing orders, referring and communicating with other health care professionals, documenting clinical information in the EHR, independently interpreting results, and coordinating care.         Arlin Krebs Epilepsy [facility] For questions after 5pm please refer to AMION to reach the Neurologist on call

## 2023-12-27 NOTE — Progress Notes (Signed)
 CSW attempted to call pt's spouse to complete TOC initial assessment d/t the pt being oriented x1. CSW called twice, and phone went to VM twice with no option to leave a VM.  CSW will check to see if spouse is at bedside.

## 2023-12-27 NOTE — Plan of Care (Signed)

## 2023-12-27 NOTE — Progress Notes (Signed)
 Initial Nutrition Assessment  DOCUMENTATION CODES:  Severe malnutrition in context of chronic illness, Underweight  INTERVENTION:  Initiate tube feeding via Cortrak: Osmolite 1.2 at 50 ml/h (1200 ml per day) *start at 33ml/hr and advance by 10ml q8h  Provides 1440 kcal, 67 gm protein, 984 ml free water daily  Monitor magnesium, potassium, and phosphorus daily for at least 3 days, MD to replete as needed, as pt is at risk for refeeding syndrome given severe malnutrition and NPO x5 days.   Add Thiamine 100 mg daily for 7 days  MVI with minerals daily  NUTRITION DIAGNOSIS:  Severe Malnutrition related to chronic illness (bladder cancer) as evidenced by severe fat depletion, severe muscle depletion.  GOAL:  Patient will meet greater than or equal to 90% of their needs  MONITOR:  Diet advancement, Labs, Weight trends, TF tolerance  REASON FOR ASSESSMENT:  Consult Enteral/tube feeding initiation and management  ASSESSMENT:  Pt admitted with decreased state of consciousness and hypotension d/t sepsis secondary to UTI. PMH significant for bladder cancer, nephrostomy tube, HTN, HLD, osteoporosis, CKD 2.  11/5: admitted  11/9: cEEG c/w subclinical seizures 11/10: Cortrak placed; TF initiated  Of note pt continues to remain encephalopathic and agitated.   Continues to remain in ICU for monitoring/airway watch. Currently protecting airway.   Per RN at bedside, plan for MRI brain today.   Spoke with patient's husband and daughter as bedside.   Patient's husband provides nutrition related history given patients mental status.  He states that about 6 weeks ago she has a similar episode r/t sepsis and was in the same states of health. Her usual weight is about 110 lbs. At that time her weight had declined down to about 98 lbs d/t reduced oral intake and mobility. Since then and up until this admission, she had started to regain weight and strength.   Patient typically eats 2 meals per  day and has some snacks in between. She follows a vegetarian diet. She does not consume any nutrition supplements but does take a Vitamin B12.   Drains/lines: Cortrak (tip in region of LOT) Right nephrostomy tube RLQ drain  Medications: SSI 0-9 units q4h, prednisone, IV abx  Labs:  Ptoassium 2.9 BUN 71 Cr 1.69 Anion gap 16 Mg 2.7 GFR 33 CBG's 116-136 x24 hours  NUTRITION - FOCUSED PHYSICAL EXAM: Flowsheet Row Most Recent Value  Orbital Region Moderate depletion  Upper Arm Region Severe depletion  Thoracic and Lumbar Region Severe depletion  Buccal Region Mild depletion  Temple Region Mild depletion  Clavicle Bone Region Moderate depletion  Clavicle and Acromion Bone Region Severe depletion  Scapular Bone Region Unable to assess  Dorsal Hand Unable to assess  [handmits]  Patellar Region Severe depletion  Anterior Thigh Region Severe depletion  Posterior Calf Region Moderate depletion  Edema (RD Assessment) None  Hair Reviewed  Eyes Unable to assess  Mouth Unable to assess  Skin Reviewed  Nails Unable to assess    Diet Order:   Diet Order             Diet NPO time specified  Diet effective now                   EDUCATION NEEDS:   No education needs have been identified at this time  Skin:  Skin Assessment: Reviewed RN Assessment  Last BM:  11/9 type 7 small, medium  Height:   Ht Readings from Last 1 Encounters:  12/22/23 5' 3 (1.6 m)  Weight:  Wt Readings from Last 1 Encounters:  12/27/23 46.3 kg     BMI:  Body mass index is 18.08 kg/m.  Estimated Nutritional Needs:   Kcal:  1400-1600  Protein:  60-75g  Fluid:  >/=1.5L  Royce Maris, RDN, LDN Clinical Nutrition See AMiON for contact information.

## 2023-12-27 NOTE — Procedures (Signed)
 Cortrak  Person Inserting Tube:  Rhonda [name], Rhonda [name], RD Tube Type:  Cortrak - 55 inches Tube Size:  10 Tube Location:  Left nare Secured by: Bridle Technique Used to Measure Tube Placement:  Marking at nare/corner of mouth Cortrak Secured At:  85 cm Initial Placement Verification:  Xray  Cortrak Tube Team Note:  Consult received to place a Cortrak feeding tube.   X-ray is required. RN may begin using tube after tube placement confirmed.  If the tube becomes dislodged please keep the tube and contact the Cortrak team at [email or web address] for replacement.  If after hours and replacement cannot be delayed, place a NG tube and confirm placement with an abdominal x-ray.    Rhonda Kenning, RD Registered Dietitian  See Amion for more information

## 2023-12-27 NOTE — Procedures (Signed)
 Patient Name: Rhonda [name]  MRN: [id number]  Epilepsy Attending: Arlin MALVA Krebs  Referring Physician/Provider: Sharie Bourbon, MD  Duration: 12/26/2023 1159 to 12/27/2023 1159  Patient history: 68yo F with ams. EEG to evaluate for seizure  Level of alertness: awake, asleep  AEDs during EEG study: LEV, LCM  Technical aspects: This EEG study was done with scalp electrodes positioned according to the 10-20 International system of electrode placement. Electrical activity was reviewed with band pass filter of 1-70Hz , sensitivity of 7 uV/mm, display speed of 77mm/sec with a 60Hz  notched filter applied as appropriate. EEG data were recorded continuously and digitally stored.  Video monitoring was available and reviewed as appropriate.  Description: At the beginning of the study, EEG showed generalized periodic discharges with triphasic morphology at 3 to 3.5 Hz.  No clinical signs were noted.  This EEG pattern is concerning for electrographic status epilepticus with generalized onset.  IV lorazepam and IV levetiracetam were administered.  Subsequently status epilepticus resolved at around 1423 on 12/26/2023.  EEG then showed continuous generalized polymorphic high amplitude sharply contoured 3 to 5 Hz theta-delta slowing.  When awake/stimulated, EEG again showed intermittent generalized periodic discharges with triphasic morphology at 1 to 2 Hz without definite evolution.  Sleep was characterized by sleep spindles (12 to 14 Hz), maximal frontocentral region.  Hyperventilation and photic stimulation were not performed.     ABNORMALITY - Electrographic status epilepticus, generalized - Periodic discharges with triphasic morphology, generalized - Continuous slow, generalized  IMPRESSION: At the beginning of the study, EEG showed generalized electrographic status epilepticus.  As antiseizure medications were administered, status epilepticus resolved on 12/26/2023 at around 1423.  Subsequently  EEG was suggestive of generalized cerebral dysfunction.  Of note, EEG also showed generalized periodic discharges with triphasic morphology.  This pattern is on the ictal-interictal continuum with increased risk of seizure recurrence.  Of note given the morphology of discharges, this pattern is typically seen due to cefepime toxicity.  Parv Manthey O Danyla Wattley

## 2023-12-27 NOTE — Progress Notes (Signed)
 NAME:  Rhonda [name], MRN:  [id number], DOB:  1955-07-05, LOS: 5 ADMISSION DATE:  12/22/2023, CONSULTATION DATE:  11/5 REFERRING MD:  EDP, CHIEF COMPLAINT:  hypotension   History of Present Illness:  68 yo female presents to decreased state of consciousness. Pt reportedly normally alert and oriented x4. Today EMS was called as over the past 24 hours pt has been more lethargic. There is no history of known sick contact, no fever/chills, no ha/cp/dizziness. Pt's husband at bedside and provides history. States that she was in her normal state of health, activity level and mentation until this am. She awoke feeling sick but able to still mobilize and communicate as normal. As the day progressed pt began feeling nauseous, vomiting and diarrhea. She also became more lethargic and unable to speak with husband or communicate for which he contacted ems.   He states that she had a episode like this about 1 month ago and has had nephrostomy tube placed 2/2 hydronephrosis found 2/2 tumor mass. He endorses that abx no longer seem to work for her, he feels frustrated that she does not seem to be improving and that she continues to get sick. I attempted to provide support.   Pt is extremely mobile/restless in bed. Not following commands and non communicative. She does not appear to be in any pain just overall restless.   Ccm was asked to admit pt 2/2 hypotension and need for vasopressor support.   Pertinent  Medical History  Bladder cancer Nephrostomy tube Htn Hyperlipidemia Osteoporosis Ckd 2  Significant Hospital Events: Including procedures, antibiotic start and stop dates in addition to other pertinent events   11/5 Admitted to icu  11/6 Admitted overnight, Pressor requirements have significant improved. Remains quite encephalopathic, agitated.  11/7 drastic increase in supplemental oxygen demand overnight now on 60 FiO2 with 45 L nasal cannula.  Remains confused and agitated 11/9  cefepime changed to zosyn, cEEG c/w subclinical seizures, stopped after ativan and keppra load, neurology consulted  Interim History / Subjective:  Around 0230, concern for lip tremor.  Remains on LTM EEG.  Vimpat added overnight  Objective    Blood pressure (!) 136/92, pulse 97, temperature 98.3 F (36.8 C), temperature source Axillary, resp. rate 18, height 5' 3 (1.6 m), weight 46.3 kg, SpO2 96%.    FiO2 (%):  [30 %-32 %] 32 %   Intake/Output Summary (Last 24 hours) at 12/27/2023 0950 Last data filed at 12/27/2023 0700 Gross per 24 hour  Intake 1411.84 ml  Output 1500 ml  Net -88.16 ml   Filed Weights   12/24/23 0500 12/25/23 0430 12/27/23 0301  Weight: 51.2 kg 52 kg 46.3 kg    Examination: General:  critically ill adult female HEENT: MM pink/dry, pupils 4/r, EEG leads  Neuro:  minimally grimaces to oral suctioning otherwise no response to noxious stimuli  CV: rr, NSR PULM:  non labored, coarse in uppers, diminished in lowers, on HFNC  GI: soft, bs+, R nephrostomy tube Extremities: warm/dry, non pitting LE Skin: no rashes   Afebrile Labs> K 2.9, Cl 97, BUN/ sCr 72/ 1.91> 71/ 1.69, Mag 2.7, WBC 16.5> 9.6, H/H 10.1/ 31.1, plts 56> 55  Resolved problem list  Lactic acidosis   Assessment and Plan   Nonconvulsive status epilepticus Acute encephalopathy- ddx include cefepime neurotoxicity, PRES, septic  - ammonia normal 11/9.  Last dose cefepime 11/8 - off precedex 11/8 P:  - appreciate neurology assistance - cont ICU monitoring/ airway watch- continues to protect airway -  cEEG per Neuro - AEDs per Neuro> keppra, vimpat, prn ativan - seizure/ aspiration precautions - plans for MRI brain w/ wo contrast today - serial neuro exams- mental status has been waxing/ waning today - neuro protective measures    Septic shock, resolved  Pseudomonal UTI and bacteremia - TTE 11/7 EF 55-60%, no RWMA, normal RV, mod to severe MR, mod TR, mod AR- previous TTE 09/24/23 showing EF  55-60%, trace MR, mild TR, normal AV P:  - cont to monitor hemodynamics, remains off pressors since 11/8 - cont zosyn, day 6/ x - trend WBC/ fever curve> remains afebrile, decreasing WBC - limited venous access> plans for midline in hopes to avoid PICC - recheck Bcx2  now and eventually will need TEE for further evaluation of TTE changes when medically stable, r/o IE with bacteremia   Acute hypoxic respiratory failure  - CXR 11/7 noted increased bilateral patchy airspace opacities and diffuse interstitial prominence, increasing pleural effusions - cont weaning supplemental O2> can transition off HHFNC today  - airway watch - aspiration precautions - abx as above - CXR in am  - ongoing pulm hygiene    AKI History of bladder CA with obstructive uropathy and R nephrostomy tube in place Hx of renal transplant 2001 Hypokalemia - 11/5 CT renal showed improved resolution of previously seen hydronephrosis, percutaneous nephrostomy in place P:  - pending Nephrology consult, pending tacrolimus level pending from 11/9, restart per nephrology recs - solucortef changed over to baseline prednisone 10mg  daily  - follow nephrostomy tube output and bladder scan prn - aggressive KCL replete and recheck BMET at 1600.  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  Suspect more related to sepsis, hx of bladder Ddx include TTP  - check DIC panel, adams 13, LDH, haptoglobin - CBC in am   Transaminitis - LFTs continue to improve 11/9,  repeat in am - cont to hold statin   HLD - cont to hold statin   Hyperglycemia  - CBG q4hrs - goal 140-180 - prn sSSI    Protein calorie malnutrition - insert cortrak  today, RD consult for EN   Husband updated at bedside per Dr. Sharie 11/10.     CRITICAL CARE Performed by: Lyle Pesa   Total critical care time: 38 minutes  Critical care time was exclusive of separately billable procedures and treating other patients.  Critical care was necessary to treat or prevent imminent or life-threatening deterioration.  Critical care was time spent personally by me on the following activities: development of treatment plan with patient and/or surrogate as well as nursing, discussions with consultants, evaluation of patient's response to treatment, examination of patient, obtaining history from patient or surrogate, ordering and performing treatments and interventions, ordering and review of laboratory studies, ordering and review of radiographic studies, pulse oximetry and re-evaluation of patient's condition.    Lyle Pesa, NP [facility] 12/27/2023, 9:50 AM  See Amion for pager If no response to pager , please call [phone] until 7pm After 7:00 pm call Elink  [phone]

## 2023-12-27 NOTE — Progress Notes (Signed)
 [facility] ADULT ICU REPLACEMENT PROTOCOL   The patient does apply for the [facility] Adult ICU Electrolyte Replacment Protocol based on the criteria listed below:   1.Exclusion criteria: TCTS, ECMO, Dialysis, and Myasthenia Gravis patients 2. Is GFR >/= 30 ml/min? Yes.    Patient's GFR today is 33 3. Is SCr </= 2? Yes.   Patient's SCr is 1.69 mg/dL 4. Did SCr increase >/= 0.5 in 24 hours? No. 5.Pt's weight >40kg  Yes.   6. Abnormal electrolyte(s): K+ 2.9  7. Electrolytes replaced per protocol 8.  Call MD STAT for K+ </= 2.5, Phos </= 1, or Mag </= 1 Physician:  Dr Kassie Darner, Norleen Barters 12/27/2023 3:23 AM

## 2023-12-28 ENCOUNTER — Inpatient Hospital Stay (HOSPITAL_COMMUNITY)

## 2023-12-28 DIAGNOSIS — I639 Cerebral infarction, unspecified: Secondary | ICD-10-CM

## 2023-12-28 DIAGNOSIS — I6381 Other cerebral infarction due to occlusion or stenosis of small artery: Secondary | ICD-10-CM

## 2023-12-28 DIAGNOSIS — T361X5A Adverse effect of cephalosporins and other beta-lactam antibiotics, initial encounter: Secondary | ICD-10-CM | POA: Diagnosis not present

## 2023-12-28 DIAGNOSIS — N390 Urinary tract infection, site not specified: Secondary | ICD-10-CM | POA: Diagnosis not present

## 2023-12-28 DIAGNOSIS — G928 Other toxic encephalopathy: Secondary | ICD-10-CM | POA: Diagnosis not present

## 2023-12-28 DIAGNOSIS — E870 Hyperosmolality and hypernatremia: Secondary | ICD-10-CM

## 2023-12-28 DIAGNOSIS — N179 Acute kidney failure, unspecified: Secondary | ICD-10-CM | POA: Diagnosis not present

## 2023-12-28 DIAGNOSIS — G40909 Epilepsy, unspecified, not intractable, without status epilepticus: Secondary | ICD-10-CM | POA: Diagnosis not present

## 2023-12-28 DIAGNOSIS — R7881 Bacteremia: Secondary | ICD-10-CM

## 2023-12-28 DIAGNOSIS — R4182 Altered mental status, unspecified: Secondary | ICD-10-CM | POA: Diagnosis not present

## 2023-12-28 LAB — LIPID PANEL
Cholesterol: 146 mg/dL (ref 0–200)
HDL: 14 mg/dL — ABNORMAL LOW (ref >40)
LDL Cholesterol: 90 mg/dL (ref 0–99)
Total CHOL/HDL Ratio: 10.4 ratio
Triglycerides: 211 mg/dL — ABNORMAL HIGH (ref <150)
VLDL: 42 mg/dL — ABNORMAL HIGH (ref 0–40)

## 2023-12-28 LAB — RENAL FUNCTION PANEL
Albumin: 2.2 g/dL — ABNORMAL LOW (ref 3.5–5.0)
Anion gap: 20 — ABNORMAL HIGH (ref 5–15)
BUN: 72 mg/dL — ABNORMAL HIGH (ref 8–23)
CO2: 30 mmol/L (ref 22–32)
Calcium: 8.9 mg/dL (ref 8.9–10.3)
Chloride: 98 mmol/L (ref 98–111)
Creatinine, Ser: 1.6 mg/dL — ABNORMAL HIGH (ref 0.44–1.00)
GFR, Estimated: 35 mL/min — ABNORMAL LOW (ref >60)
Glucose, Bld: 220 mg/dL — ABNORMAL HIGH (ref 70–99)
Phosphorus: 3.8 mg/dL (ref 2.5–4.6)
Potassium: 4.6 mmol/L (ref 3.5–5.1)
Sodium: 148 mmol/L — ABNORMAL HIGH (ref 135–145)

## 2023-12-28 LAB — CBC
HCT: 35.1 % — ABNORMAL LOW (ref 36.0–46.0)
Hemoglobin: 11.1 g/dL — ABNORMAL LOW (ref 12.0–15.0)
MCH: 31.2 pg (ref 26.0–34.0)
MCHC: 31.6 g/dL (ref 30.0–36.0)
MCV: 98.6 fL (ref 80.0–100.0)
Platelets: 77 K/uL — ABNORMAL LOW (ref 150–400)
RBC: 3.56 MIL/uL — ABNORMAL LOW (ref 3.87–5.11)
RDW: 14.2 % (ref 11.5–15.5)
WBC: 13.5 K/uL — ABNORMAL HIGH (ref 4.0–10.5)
nRBC: 0.4 % — ABNORMAL HIGH (ref 0.0–0.2)

## 2023-12-28 LAB — GLUCOSE, CAPILLARY
Glucose-Capillary: 180 mg/dL — ABNORMAL HIGH (ref 70–99)
Glucose-Capillary: 182 mg/dL — ABNORMAL HIGH (ref 70–99)
Glucose-Capillary: 198 mg/dL — ABNORMAL HIGH (ref 70–99)
Glucose-Capillary: 214 mg/dL — ABNORMAL HIGH (ref 70–99)
Glucose-Capillary: 221 mg/dL — ABNORMAL HIGH (ref 70–99)
Glucose-Capillary: 230 mg/dL — ABNORMAL HIGH (ref 70–99)

## 2023-12-28 LAB — HEPATIC FUNCTION PANEL
ALT: 120 U/L — ABNORMAL HIGH (ref 0–44)
AST: 52 U/L — ABNORMAL HIGH (ref 15–41)
Albumin: 2.2 g/dL — ABNORMAL LOW (ref 3.5–5.0)
Alkaline Phosphatase: 88 U/L (ref 38–126)
Bilirubin, Direct: 0.1 mg/dL (ref 0.0–0.2)
Indirect Bilirubin: 0.8 mg/dL (ref 0.3–0.9)
Total Bilirubin: 0.9 mg/dL (ref 0.0–1.2)
Total Protein: 5.6 g/dL — ABNORMAL LOW (ref 6.5–8.1)

## 2023-12-28 LAB — BRAIN NATRIURETIC PEPTIDE: B Natriuretic Peptide: 1901.1 pg/mL — ABNORMAL HIGH (ref 0.0–100.0)

## 2023-12-28 LAB — HEMOGLOBIN A1C
Hgb A1c MFr Bld: 6.3 % — ABNORMAL HIGH (ref 4.8–5.6)
Mean Plasma Glucose: 134.11 mg/dL

## 2023-12-28 MED ORDER — TACROLIMUS 1 MG/ML ORAL SUSPENSION
2.0000 mg | Freq: Every day | ORAL | Status: DC
  Administered 2023-12-28 – 2024-01-01 (×5): 2 mg
  Filled 2023-12-28 (×7): qty 2

## 2023-12-28 MED ORDER — ASPIRIN 81 MG PO CHEW
81.0000 mg | CHEWABLE_TABLET | Freq: Every day | ORAL | Status: DC
  Administered 2023-12-28 – 2023-12-29 (×2): 81 mg
  Filled 2023-12-28 (×2): qty 1

## 2023-12-28 MED ORDER — INSULIN ASPART 100 UNIT/ML IJ SOLN
0.0000 [IU] | INTRAMUSCULAR | Status: DC
  Administered 2023-12-28: 3 [IU] via SUBCUTANEOUS
  Administered 2023-12-28 (×2): 5 [IU] via SUBCUTANEOUS
  Administered 2023-12-28: 3 [IU] via SUBCUTANEOUS
  Administered 2023-12-29: 2 [IU] via SUBCUTANEOUS
  Administered 2023-12-29: 8 [IU] via SUBCUTANEOUS
  Administered 2023-12-29: 2 [IU] via SUBCUTANEOUS
  Administered 2023-12-29: 8 [IU] via SUBCUTANEOUS
  Administered 2023-12-29: 5 [IU] via SUBCUTANEOUS
  Administered 2023-12-30: 8 [IU] via SUBCUTANEOUS
  Administered 2023-12-30 – 2023-12-31 (×6): 3 [IU] via SUBCUTANEOUS
  Administered 2023-12-31: 2 [IU] via SUBCUTANEOUS
  Administered 2023-12-31: 3 [IU] via SUBCUTANEOUS
  Administered 2023-12-31: 5 [IU] via SUBCUTANEOUS
  Administered 2023-12-31 – 2024-01-01 (×4): 3 [IU] via SUBCUTANEOUS
  Administered 2024-01-02: 5 [IU] via SUBCUTANEOUS
  Administered 2024-01-02: 3 [IU] via SUBCUTANEOUS
  Administered 2024-01-02 – 2024-01-03 (×2): 2 [IU] via SUBCUTANEOUS
  Filled 2023-12-28: qty 3
  Filled 2023-12-28: qty 5
  Filled 2023-12-28: qty 3
  Filled 2023-12-28: qty 8
  Filled 2023-12-28: qty 2
  Filled 2023-12-28 (×7): qty 3
  Filled 2023-12-28: qty 8
  Filled 2023-12-28 (×2): qty 3
  Filled 2023-12-28: qty 2
  Filled 2023-12-28 (×2): qty 3
  Filled 2023-12-28: qty 5
  Filled 2023-12-28 (×2): qty 3
  Filled 2023-12-28 (×3): qty 5
  Filled 2023-12-28: qty 2
  Filled 2023-12-28: qty 8

## 2023-12-28 MED ORDER — PRAVASTATIN SODIUM 10 MG PO TABS
20.0000 mg | ORAL_TABLET | Freq: Every day | ORAL | Status: DC
  Administered 2023-12-28: 20 mg
  Filled 2023-12-28: qty 2

## 2023-12-28 MED ORDER — FREE WATER
200.0000 mL | Freq: Three times a day (TID) | Status: DC
  Administered 2023-12-28 – 2023-12-29 (×3): 200 mL

## 2023-12-28 MED ORDER — THIAMINE MONONITRATE 100 MG PO TABS
100.0000 mg | ORAL_TABLET | Freq: Every day | ORAL | Status: DC
  Administered 2023-12-28 – 2023-12-29 (×2): 100 mg
  Filled 2023-12-28 (×2): qty 1

## 2023-12-28 MED ORDER — ORAL CARE MOUTH RINSE
15.0000 mL | OROMUCOSAL | Status: DC
  Administered 2023-12-28 – 2024-01-04 (×27): 15 mL via OROMUCOSAL

## 2023-12-28 MED ORDER — TACROLIMUS 1 MG/ML ORAL SUSPENSION
3.0000 mg | Freq: Every day | ORAL | Status: DC
  Administered 2023-12-28 – 2024-01-02 (×6): 3 mg
  Filled 2023-12-28 (×6): qty 3

## 2023-12-28 MED ORDER — ORAL CARE MOUTH RINSE
15.0000 mL | OROMUCOSAL | Status: DC | PRN
Start: 2023-12-28 — End: 2024-01-04

## 2023-12-28 NOTE — Progress Notes (Signed)
 Pt weaned off heated high flow nasal cannula at this time. Pt placed on 6L salter HFNC without complication.

## 2023-12-28 NOTE — Evaluation (Signed)
 Physical Therapy Evaluation Patient Details Name: Rhonda [name] MRN: [id number] DOB: 10-02-1955 Today's Date: 12/28/2023  History of Present Illness  68 yo female presents 11/5 for decreased state of consciousness. found to be in septic shock from Pseudomonas UTI and was found to have pseudomonal bacteremia. 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 PMH:  Diabetes mellitus without complication (HCC), bladder cancer, Hyperlipidemia, Hypertension, and Kidney transplant recipient.    Clinical Impression  Pt admitted with above diagnosis. Previously independent. Was very active, walking in parks regularly for exercise until about 10 weeks ago when she became septic. Able to return home and recover without HH therapies but husband states she wasn't quite back to baseline. Patient lethargic today but reportedly much better than yesterday. She was able to move LEs with cues but upper extremities, trunk, and neck profoundly weak today. Pt needed max assist for bed mobility, mod assist to stand and balance with UE supported by therapist twice. LEs buckle as she fatigues. VSS throughout on 4L HFNC. Reviewed LE exercises with husband. Placed into modified chair position in bed. Nods yes when asked if she is comfortable. Patient will benefit from intensive inpatient follow-up therapy, >3 hours/day. Pt currently with functional limitations due to the deficits listed below (see PT Problem List). Pt will benefit from acute skilled PT to increase their independence and safety with mobility to allow discharge.            If plan is discharge home, recommend the following: Two people to help with walking and/or transfers;Two people to help with bathing/dressing/bathroom;Assistance with cooking/housework;Direct supervision/assist for medications management;Direct supervision/assist for financial management;Assist for transportation;Help with stairs or ramp for entrance;Supervision due  to cognitive status   Can travel by private vehicle        Equipment Recommendations Rolling walker (2 wheels);BSC/3in1;Wheelchair (measurements PT);Wheelchair cushion (measurements PT)  Recommendations for Other Services  Rehab consult    Functional Status Assessment Patient has had a recent decline in their functional status and demonstrates the ability to make significant improvements in function in a reasonable and predictable amount of time.     Precautions / Restrictions Precautions Precautions: Fall Recall of Precautions/Restrictions: Impaired Restrictions Weight Bearing Restrictions Per Provider Order: No      Mobility  Bed Mobility Overal bed mobility: Needs Assistance Bed Mobility: Rolling, Sidelying to Sit, Sit to Sidelying Rolling: Max assist, Used rails Sidelying to sit: Max assist, HOB elevated, Used rails     Sit to sidelying: Max assist, HOB elevated, Used rails General bed mobility comments: Requires hand over hand assist and verbal cues for reaching towards rails, max assist to roll but pt able to move LEs out of bed with facilitation for sequencing, and bring 1 leg into bed fully on her own. Required max assist for trunk and head support with transitions.    Transfers Overall transfer level: Needs assistance Equipment used: 1 person hand held assist Transfers: Sit to/from Stand Sit to Stand: Mod assist           General transfer comment: Fair power up to stand x2 from edge of bed. Pt requires assist for balance, trunk and neck support. Difficulty keeping head upright without external support although her eyes are open tracking for targets when cued. Lightly begins to buckle as she fatigues.    Ambulation/Gait               General Gait Details: Unsafe to attempt until more alert.  Stairs  Wheelchair Mobility     Tilt Bed    Modified Rankin (Stroke Patients Only)       Balance Overall balance assessment: Needs  assistance Sitting-balance support: No upper extremity supported, Feet supported Sitting balance-Leahy Scale: Poor Sitting balance - Comments: mod assist for seated balance, spontaneously pushes forward at times Postural control:  (All directions) Standing balance support: Single extremity supported Standing balance-Leahy Scale: Poor Standing balance comment: Mod assist for balance standing EOB with single  UE supported.                             Pertinent Vitals/Pain Pain Assessment Pain Assessment: CPOT Facial Expression: Relaxed, neutral Body Movements: Absence of movements Muscle Tension: Relaxed Compliance with ventilator (intubated pts.): N/A Vocalization (extubated pts.): Talking in normal tone or no sound CPOT Total: 0 Pain Intervention(s): Limited activity within patient's tolerance, Monitored during session, Repositioned    Home Living Family/patient expects to be discharged to:: Private residence Living Arrangements: Spouse/significant other Available Help at Discharge: Family;Available 24 hours/day Type of Home: Apartment Home Access: Stairs to enter Entrance Stairs-Rails: Right Entrance Stairs-Number of Steps: 13   Home Layout: One level Home Equipment: None Additional Comments: Lives on second floor of apartment    Prior Function Prior Level of Function : Independent/Modified Independent;Driving;History of Falls (last six months)             Mobility Comments: Husband reports pt very active until she became septic about 10 weeks ago and was hospitalized, lost a lot of weight and strength. Since returning home she has regained about 7lbs and is walking for exercise again but was not quite back to baseline before this infection set in. She is independent and driving at baseline. ADLs Comments: Ind     Extremity/Trunk Assessment   Upper Extremity Assessment Upper Extremity Assessment: Defer to OT evaluation    Lower Extremity  Assessment Lower Extremity Assessment: Generalized weakness;Difficult to assess due to impaired cognition (moves LEs against gravity weakly when cued)       Communication   Communication Communication: Impaired Factors Affecting Communication: Difficulty expressing self (suspect due to lethargy)    Cognition Arousal: Lethargic Behavior During Therapy: Flat affect, Impulsive   PT - Cognitive impairments: Difficult to assess, Attention, Initiation, Sequencing, Problem solving, Safety/Judgement, Awareness Difficult to assess due to: Level of arousal                     PT - Cognition Comments: Very lethargic, minimally verbal but nods head yes and no appropriately when alert enough. Following commands: Impaired Following commands impaired: Follows one step commands with increased time, Follows one step commands inconsistently     Cueing Cueing Techniques: Verbal cues, Gestural cues, Tactile cues, Visual cues     General Comments General comments (skin integrity, edema, etc.): SpO2 98% and greater on 4L HFNC. HR 96, BP 129/83    Exercises General Exercises - Lower Extremity Ankle Circles/Pumps: AAROM, Both, 15 reps, Supine Long Arc Quad: Strengthening, AAROM, Both, 10 reps, Seated   Assessment/Plan    PT Assessment Patient needs continued PT services  PT Problem List Decreased strength;Decreased activity tolerance;Decreased balance;Decreased mobility;Decreased coordination;Decreased cognition;Decreased knowledge of use of DME;Decreased safety awareness;Decreased knowledge of precautions;Cardiopulmonary status limiting activity       PT Treatment Interventions DME instruction;Gait training;Functional mobility training;Therapeutic activities;Therapeutic exercise;Balance training;Neuromuscular re-education;Cognitive remediation;Patient/family education    PT Goals (Current goals can be found in the Care  Plan section)  Acute Rehab PT Goals Patient Stated Goal: Improve  strength and independence again (husband) PT Goal Formulation: With family Time For Goal Achievement: 01/11/24 Potential to Achieve Goals: Good    Frequency Min 3X/week     Co-evaluation               AM-PAC PT 6 Clicks Mobility  Outcome Measure Help needed turning from your back to your side while in a flat bed without using bedrails?: A Lot Help needed moving from lying on your back to sitting on the side of a flat bed without using bedrails?: A Lot Help needed moving to and from a bed to a chair (including a wheelchair)?: Total Help needed standing up from a chair using your arms (e.g., wheelchair or bedside chair)?: A Lot Help needed to walk in hospital room?: Total Help needed climbing 3-5 steps with a railing? : Total 6 Click Score: 9    End of Session Equipment Utilized During Treatment: Gait belt;Oxygen Activity Tolerance: Patient limited by fatigue;Patient limited by lethargy Patient left: in bed;with call bell/phone within reach;with bed alarm set;with family/visitor present;with SCD's reapplied;with restraints reapplied (Mits on, bed in chair mode) Nurse Communication: Mobility status PT Visit Diagnosis: Unsteadiness on feet (R26.81);Other abnormalities of gait and mobility (R26.89);Muscle weakness (generalized) (M62.81);History of falling (Z91.81);Difficulty in walking, not elsewhere classified (R26.2);Other symptoms and signs involving the nervous system (R29.898)    Time: 8389-8356 PT Time Calculation (min) (ACUTE ONLY): 33 min   Charges:   PT Evaluation $PT Eval Moderate Complexity: 1 Mod PT Treatments $Therapeutic Activity: 8-22 mins PT General Charges $$ ACUTE PT VISIT: 1 Visit         Leontine Roads, PT, DPT [facility]  Rehabilitation Services Physical Therapist Office: [phone] Website: [email or web address]   Leontine GORMAN Roads 12/28/2023, 5:05 PM

## 2023-12-28 NOTE — Progress Notes (Signed)
 EEG D/C'd. No noted skin break down. [facility]irum notified.

## 2023-12-28 NOTE — Progress Notes (Signed)
  Inpatient Rehab Admissions Coordinator :  Per therapy recommendations, patient was screened for CIR candidacy by Ottie Glazier RN MSN.  At this time patient appears to be a potential candidate for CIR. I will place a rehab consult per protocol for full assessment. Please call me with any questions.  Ottie Glazier RN MSN Admissions Coordinator [phone]

## 2023-12-28 NOTE — Progress Notes (Signed)
 NAME:  Rhonda [name], MRN:  [id number], DOB:  1955/05/28, LOS: 6 ADMISSION DATE:  12/22/2023, CONSULTATION DATE:  11/5 REFERRING MD:  EDP, CHIEF COMPLAINT:  hypotension   History of Present Illness:  68 yo female presents to decreased state of consciousness. Pt reportedly normally alert and oriented x4. Today EMS was called as over the past 24 hours pt has been more lethargic. There is no history of known sick contact, no fever/chills, no ha/cp/dizziness. Pt's husband at bedside and provides history. States that she was in her normal state of health, activity level and mentation until this am. She awoke feeling sick but able to still mobilize and communicate as normal. As the day progressed pt began feeling nauseous, vomiting and diarrhea. She also became more lethargic and unable to speak with husband or communicate for which he contacted ems.   He states that she had a episode like this about 1 month ago and has had nephrostomy tube placed 2/2 hydronephrosis found 2/2 tumor mass. He endorses that abx no longer seem to work for her, he feels frustrated that she does not seem to be improving and that she continues to get sick. I attempted to provide support.   Pt is extremely mobile/restless in bed. Not following commands and non communicative. She does not appear to be in any pain just overall restless.   Ccm was asked to admit pt 2/2 hypotension and need for vasopressor support.   Pertinent  Medical History  Bladder cancer Nephrostomy tube Htn Hyperlipidemia Osteoporosis Ckd 2  Significant Hospital Events: Including procedures, antibiotic start and stop dates in addition to other pertinent events   11/5 Admitted to icu  11/6 Admitted overnight, Pressor requirements have significant improved. Remains quite encephalopathic, agitated.  11/7 drastic increase in supplemental oxygen demand overnight now on 60 FiO2 with 45 L nasal cannula.  Remains confused and agitated 11/9  cefepime changed to zosyn, cEEG c/w subclinical seizures, stopped after ativan and keppra load, neurology consulted 11/10 ongoing cEEG, vimpat added, MRI   Interim History / Subjective:  More awake today and following commands   Objective    Blood pressure 119/78, pulse 87, temperature 98.1 F (36.7 C), temperature source Axillary, resp. rate 12, height 5' 3 (1.6 m), weight 45.4 kg, SpO2 100%.    FiO2 (%):  [30 %-100 %] 100 %   Intake/Output Summary (Last 24 hours) at 12/28/2023 0726 Last data filed at 12/28/2023 0600 Gross per 24 hour  Intake 373.86 ml  Output 650 ml  Net -276.14 ml   Filed Weights   12/25/23 0430 12/27/23 0301 12/28/23 0500  Weight: 52 kg 46.3 kg 45.4 kg    Examination: General:  ill appearing older female lying in bed in NAD HEENT: MM pink/moist, pupils 3/r, EEG leads in place Neuro:  arouses to name, follows simple commands in all extremities, oriented to name CV: rr, NSR, +2 pulses PULM:  non labored, clear and diminished in bases GI: soft, bs+, NT  Extremities: warm/dry, no LE edema  Skin: no rashes    Afebrile UOP 650 ml/ 24hrs CBG 160-214  Labs>  CXR> better aeration, residual small right pleural effusion, atelectasis vs infiltrate   MRI brain w/wo 11/10>  1. Punctate focus of subacute infarct in the right centrum semiovale. No significant edema or midline shift. 2. Minimal periventricular white matter FLAIR hyperintensity, likely chronic microvascular ischemic change. 3. No abnormal intracranial enhancement  Resolved problem list  Lactic acidosis   Assessment and Plan  Nonconvulsive status epilepticus, resolved Acute encephalopathy, suspected due to sepsis and cefepime neurotoxicity  Subacute ischemic CVA - ammonia normal 11/9.  Last dose cefepime 11/8 - off precedex 11/8 P:  - Improving mental status today, continues to protect airway but continue to monitor in ICU today  - MRI brain 11/10 as above - stopping cEEG > no  seizures overnight  - appreciate neurology assistance> signing off 11/11 - per neuro recs can stop vimpat in 2-3 days and gradual wean off keppra as mental status continues to improve given SE was provoked  - adding ASA, checking lipid panel and A1c, carotid dopplers  - serial neuro exams - seizure precautions - PT/ OT/ SLP - NPO, using cortrak for now till sustained alertness for bedside swallow screen   Septic shock, resolved  Pseudomonal UTI and bacteremia - TTE 11/7 EF 55-60%, no RWMA, normal RV, mod to severe MR, mod TR, mod AR- previous TTE 09/24/23 showing EF 55-60%, trace MR, mild TR, normal AV - off pressors 11/8 P:  - remains normotensive - cont zosyn for l0 days  - trend WBC/ fever curve - follow repeat BC - eventually will need f/u with cardiology for echo changes    Acute hypoxic respiratory failure  - cont to wean supplemental O2 for sat goal > 92 - aggressive pulm hygiene - IS, mobilize  - CXR today with much improvement, atelectasis, small residual right pleural effusion   AKI, improving History of bladder CA with obstructive uropathy and R nephrostomy tube in place Hx of renal transplant 2001 Hypokalemia, resolved Hypernatremia - 11/5 CT renal showed improved resolution of previously seen hydronephrosis, percutaneous nephrostomy in place P:  - discussed with Dr. 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  Suspect more related to sepsis, hx of bladder Ddx include TTP  P:  - plts improved today, suspect related  to sepsis  - DIC panel neg, LDH elevated, pending haptoglobin and adams 13 - trend on CBC   Transaminitis - LFTs continue to improve, trend periodically    HLD - restart statin   Hyperglycemia  - increase prn SSI to moderate - goal 140-180, CBG q4 - pending A1c   Protein calorie malnutrition - EN per RD - thiamine   Husband updated at bedside 11/11    CRITICAL CARE Performed by: Lyle Pesa   Total critical care time: 37 minutes  Critical care time was exclusive of separately billable procedures and treating other patients.  Critical care was necessary to treat or prevent imminent or life-threatening deterioration.  Critical care was time spent personally by me on the following activities: development of treatment plan with patient and/or surrogate as well as nursing, discussions with consultants, evaluation of patient's response to treatment, examination of patient, obtaining history from patient or surrogate, ordering and performing treatments and interventions, ordering and review of laboratory studies, ordering and review of radiographic studies, pulse oximetry and re-evaluation of patient's condition.    Lyle Pesa, NP [facility] 12/28/2023, 7:26 AM  See Amion for pager If  no response to pager , please call [phone] until 7pm After 7:00 pm call Elink  [phone]

## 2023-12-28 NOTE — Procedures (Addendum)
 Patient Name: Rhonda [name]  MRN: [id number]  Epilepsy Attending: Arlin MALVA Krebs  Referring Physician/Provider: Sharie Bourbon, MD  Duration: 12/27/2023 1159 to 12/28/2023 1134   Patient history: 68yo F with ams. EEG to evaluate for seizure   Level of alertness: awake, asleep   AEDs during EEG study: LEV, LCM   Technical aspects: This EEG study was done with scalp electrodes positioned according to the 10-20 International system of electrode placement. Electrical activity was reviewed with band pass filter of 1-70Hz , sensitivity of 7 uV/mm, display speed of 6mm/sec with a 60Hz  notched filter applied as appropriate. EEG data were recorded continuously and digitally stored.  Video monitoring was available and reviewed as appropriate.   Description: EEG showed continuous generalized polymorphic high amplitude sharply contoured 3 to 5 Hz theta-delta slowing, at times with triphasic morphology. Sleep was characterized by sleep spindles (12 to 14 Hz), maximal frontocentral region. Hyperventilation and photic stimulation were not performed.     EEG was disconnected between 12/27/2023 1520 to 1952 for MRI brain.   ABNORMALITY - Continuous slow, generalized   IMPRESSION: This study was suggestive of generalized cerebral dysfunction ( encephalopathy). No seizures or epileptiform discharges  were noted.   Rhonda [name]

## 2023-12-28 NOTE — Progress Notes (Addendum)
 Subjective: No acute events overnight.  No new concerns.  Daughter at bedside states patient has been waking up asking to go home.  ROS: Unable to obtain due to poor mental status  Examination  Vital signs in last 24 hours: Temp:  [97.7 F (36.5 C)-98.3 F (36.8 C)] 98.1 F (36.7 C) (11/11 0715) Pulse Rate:  [82-105] 82 (11/11 0700) Resp:  [11-28] 13 (11/11 0700) BP: (90-149)/(69-108) 118/92 (11/11 0700) SpO2:  [91 %-100 %] 100 % (11/11 0700) FiO2 (%):  [30 %-100 %] 100 % (11/11 0234) Weight:  [45.4 kg] 45.4 kg (11/11 0500)  General: lying in bed, not in apparent distress Neuro: Opens eyes to repeated tactile stimulation, able to tell me her name, did not answer the orientation questions, kept asking to go home, PERRLA, EOMI, antigravity strength in bilateral upper extremities, spontaneously moving bilateral lower extremities  Basic Metabolic Panel: Recent Labs  Lab 12/23/23 0400 12/23/23 1846 12/24/23 0500 12/25/23 0258 12/25/23 0929 12/26/23 0253 12/27/23 0157 12/27/23 0158 12/27/23 1524 12/28/23 0733  NA 136 135 137 137 134* 141  --  142 147* 148*  K 3.9 3.6 4.0 3.8 3.5 2.8*  --  2.9* 4.4 4.6  CL 96* 91* 91* 93*  --  93*  --  97* 100 98  CO2 21* 29 29 29   --  29  --  29 29 30   GLUCOSE 138* 189* 152* 144*  --  112*  --  128* 142* 220*  BUN 54* 58* 57* 73*  --  72*  --  71* 61* 72*  CREATININE 3.78* 2.90* 2.56* 2.44*  --  1.91*  --  1.69* 1.59* 1.60*  CALCIUM 7.3* 6.9* 6.9* 6.7*  --  7.7*  --  7.7* 8.4* 8.9  MG 1.1* 4.1* 4.0* 3.7*  --  3.3*  --  2.7*  --   --   PHOS 6.7*  --  6.1*  --   --   --  4.5  --   --  3.8    CBC: Recent Labs  Lab 12/22/23 1813 12/23/23 0400 12/24/23 1030 12/25/23 0258 12/25/23 0929 12/26/23 0253 12/27/23 0158 12/27/23 1600 12/28/23 0733  WBC 16.0*   < > 26.3* 19.1*  --  16.5* 9.6  --  13.5*  NEUTROABS 13.4*  --   --   --   --   --   --   --   --   HGB 11.4*   < > 8.7* 8.5* 8.5* 10.0* 10.1*  --  11.1*  HCT 37.2   < > 26.0* 26.1*  25.0* 30.4* 31.1*  --  35.1*  MCV 101.6*   < > 94.9 96.7  --  94.4 95.1  --  98.6  PLT 191   < > 71* 62*  --  56* 55* 63* 77*   < > = values in this interval not displayed.     Coagulation Studies: Recent Labs    12/27/23 1600  LABPROT 16.0*  INR 1.2    Imaging personally reviewed  MRI brain with without contrast 12/27/2023: Punctate focus of subacute infarct in the right centrum semiovale. No significant edema or midline shift. Minimal periventricular white matter FLAIR hyperintensity, likely chronic microvascular ischemic change. No abnormal intracranial enhancement.  ASSESSMENT AND PLAN:68 year old female presented with altered mental status and was found to have UTI.  She was initially treated with cefepime which had since been switched to Zosyn.   Nonconvulsive status epilepticus, resolved  Acute encephalopathy, likely toxic-metabolic -Patient most likely  had nonconvulsive status epilepticus in the setting of cefepime use in a patient with poor renal function - Continue Keppra 500 mg twice daily and Vimpat 50mg  twice daily for now.  As patient's mental status continues to improve, can likely DC Vimpat in 2 to 3 days and gradually wean off Keppra as status epilepticus was provoked - DC LTM EEG - Discussed plan with daughter at bedside and ICU team via secure chat  Subacute ischemic stroke (incidental) Etiology: Likely small vessel disease - Will start aspirin 2 mg daily -Will check lipid panel and A1c - will check carotid ultrasound - Likely obtain MRA head without contrast as an outpatient if needed - Neurology will sign off.  Please call us  back for any further questions.     I personally spent a total of 38 minutes in the care of the patient today including getting/reviewing separately obtained history, performing a medically appropriate exam/evaluation, counseling and educating, placing orders, referring and communicating with other health care professionals, documenting  clinical information in the EHR, independently interpreting results, and coordinating care.        Arlin Krebs Epilepsy [facility] For questions after 5pm please refer to AMION to reach the Neurologist on call

## 2023-12-29 DIAGNOSIS — A419 Sepsis, unspecified organism: Secondary | ICD-10-CM

## 2023-12-29 DIAGNOSIS — G4700 Insomnia, unspecified: Secondary | ICD-10-CM | POA: Diagnosis not present

## 2023-12-29 DIAGNOSIS — G40901 Epilepsy, unspecified, not intractable, with status epilepticus: Secondary | ICD-10-CM

## 2023-12-29 DIAGNOSIS — N390 Urinary tract infection, site not specified: Secondary | ICD-10-CM

## 2023-12-29 DIAGNOSIS — G9340 Encephalopathy, unspecified: Secondary | ICD-10-CM | POA: Diagnosis not present

## 2023-12-29 DIAGNOSIS — G9341 Metabolic encephalopathy: Secondary | ICD-10-CM

## 2023-12-29 DIAGNOSIS — E43 Unspecified severe protein-calorie malnutrition: Secondary | ICD-10-CM

## 2023-12-29 DIAGNOSIS — B965 Pseudomonas (aeruginosa) (mallei) (pseudomallei) as the cause of diseases classified elsewhere: Secondary | ICD-10-CM

## 2023-12-29 LAB — CBC
HCT: 37.4 % (ref 36.0–46.0)
Hemoglobin: 11.9 g/dL — ABNORMAL LOW (ref 12.0–15.0)
MCH: 31.1 pg (ref 26.0–34.0)
MCHC: 31.8 g/dL (ref 30.0–36.0)
MCV: 97.7 fL (ref 80.0–100.0)
Platelets: 91 K/uL — ABNORMAL LOW (ref 150–400)
RBC: 3.83 MIL/uL — ABNORMAL LOW (ref 3.87–5.11)
RDW: 14.2 % (ref 11.5–15.5)
WBC: 15.4 K/uL — ABNORMAL HIGH (ref 4.0–10.5)
nRBC: 0.2 % (ref 0.0–0.2)

## 2023-12-29 LAB — RENAL FUNCTION PANEL
Albumin: 2.4 g/dL — ABNORMAL LOW (ref 3.5–5.0)
Anion gap: 13 (ref 5–15)
BUN: 63 mg/dL — ABNORMAL HIGH (ref 8–23)
CO2: 31 mmol/L (ref 22–32)
Calcium: 8.6 mg/dL — ABNORMAL LOW (ref 8.9–10.3)
Chloride: 102 mmol/L (ref 98–111)
Creatinine, Ser: 1.6 mg/dL — ABNORMAL HIGH (ref 0.44–1.00)
GFR, Estimated: 35 mL/min — ABNORMAL LOW (ref >60)
Glucose, Bld: 164 mg/dL — ABNORMAL HIGH (ref 70–99)
Phosphorus: 2 mg/dL — ABNORMAL LOW (ref 2.5–4.6)
Potassium: 3.8 mmol/L (ref 3.5–5.1)
Sodium: 146 mmol/L — ABNORMAL HIGH (ref 135–145)

## 2023-12-29 LAB — GLUCOSE, CAPILLARY
Glucose-Capillary: 143 mg/dL — ABNORMAL HIGH (ref 70–99)
Glucose-Capillary: 150 mg/dL — ABNORMAL HIGH (ref 70–99)
Glucose-Capillary: 174 mg/dL — ABNORMAL HIGH (ref 70–99)
Glucose-Capillary: 229 mg/dL — ABNORMAL HIGH (ref 70–99)
Glucose-Capillary: 254 mg/dL — ABNORMAL HIGH (ref 70–99)
Glucose-Capillary: 270 mg/dL — ABNORMAL HIGH (ref 70–99)

## 2023-12-29 LAB — MAGNESIUM: Magnesium: 1.6 mg/dL — ABNORMAL LOW (ref 1.7–2.4)

## 2023-12-29 MED ORDER — ASPIRIN 81 MG PO CHEW
81.0000 mg | CHEWABLE_TABLET | Freq: Every day | ORAL | Status: DC
  Administered 2023-12-30 – 2024-01-02 (×4): 81 mg via ORAL
  Filled 2023-12-29 (×5): qty 1

## 2023-12-29 MED ORDER — PREDNISONE 10 MG PO TABS
10.0000 mg | ORAL_TABLET | Freq: Every day | ORAL | Status: DC
  Administered 2023-12-30 – 2024-01-04 (×6): 10 mg via ORAL
  Filled 2023-12-29 (×6): qty 1

## 2023-12-29 MED ORDER — THIAMINE MONONITRATE 100 MG PO TABS
100.0000 mg | ORAL_TABLET | Freq: Every day | ORAL | Status: DC
  Administered 2023-12-30 – 2024-01-04 (×6): 100 mg via ORAL
  Filled 2023-12-29 (×6): qty 1

## 2023-12-29 MED ORDER — FREE WATER
200.0000 mL | Freq: Four times a day (QID) | Status: DC
  Administered 2023-12-29 – 2023-12-30 (×5): 200 mL

## 2023-12-29 MED ORDER — POTASSIUM PHOSPHATES 15 MMOLE/5ML IV SOLN
30.0000 mmol | Freq: Once | INTRAVENOUS | Status: AC
  Administered 2023-12-29: 30 mmol via INTRAVENOUS
  Filled 2023-12-29: qty 10

## 2023-12-29 MED ORDER — MAGNESIUM SULFATE 2 GM/50ML IV SOLN
2.0000 g | Freq: Once | INTRAVENOUS | Status: AC
  Administered 2023-12-29: 2 g via INTRAVENOUS
  Filled 2023-12-29: qty 50

## 2023-12-29 MED ORDER — PRAVASTATIN SODIUM 10 MG PO TABS
20.0000 mg | ORAL_TABLET | Freq: Every day | ORAL | Status: DC
  Administered 2023-12-29 – 2024-01-03 (×6): 20 mg via ORAL
  Filled 2023-12-29 (×6): qty 2

## 2023-12-29 NOTE — TOC Progression Note (Addendum)
 Transition of Care [facility]) - Progression Note    Patient Details  Name: Rhonda [name] MRN: [id number] Date of Birth: 1956/01/13  [facility] CM/SW Contact  Lendia Dais, [place] Phone Number: 12/29/2023, 9:38 AM  Clinical Narrative: CIR is following for possible candidacy. Pt is pending an assessment from the admissions team.  CSW will continue to follow.                        Expected Discharge Plan and Services                                               Social Drivers of Health (SDOH) Interventions SDOH Screenings   Food Insecurity: No Food Insecurity (12/24/2023)  Housing: Low Risk  (12/24/2023)  Transportation Needs: No Transportation Needs (12/24/2023)  Utilities: Not At Risk (12/24/2023)  Alcohol Screen: Low Risk  (11/29/2023)  Depression (PHQ2-9): Low Risk  (12/21/2023)  Financial Resource Strain: Low Risk  (11/29/2023)  Physical Activity: Sufficiently Active (11/29/2023)  Social Connections: Moderately Integrated (12/24/2023)  Stress: Stress Concern Present (11/29/2023)  Tobacco Use: Medium Risk (12/24/2023)    Readmission Risk Interventions     No data to display

## 2023-12-29 NOTE — Evaluation (Signed)
 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 Dx septic shock from Pseudomonas UTI and was found to have pseudomonal bacteremia, nonconvulsive status epilepticus, subacute ischemic CVA (right centrum semiovale). Cortrak 11/10. PMH:  Diabetes mellitus without complication (HCC), bladder cancer, Hyperlipidemia, Hypertension, and Kidney transplant recipient.    Assessment / Plan / Recommendation  Clinical Impression  Pt presents with normal oropharyngeal swallow with no s/s of aspiration. Oral mechanism exam normal.  Presents with thorough mastication, the appearance of a brisk swallow response, no s/s of aspiration.  D/W RN. Resume a regular diet tray, thin liquids; meds with water. No dysphagia. SLP will sign off. SLP Visit Diagnosis: Dysphagia, unspecified (R13.10)    Aspiration Risk  No limitations    Diet Recommendation   Thin;Age appropriate regular  Medication Administration: Whole meds with liquid    Other  Recommendations Oral Care Recommendations: Oral care BID       Swallow Study   General Date of Onset:  12/29/23 HPI: 68 yo female presented 11/5 for decreased state of consciousness. Dx septic shock from Pseudomonas UTI and was found to have pseudomonal bacteremia, nonconvulsive status epilepticus, subacute ischemic CVA (right centrum semiovale). Cortrak 11/10. PMH:  Diabetes mellitus without complication (HCC), bladder cancer, Hyperlipidemia, Hypertension, and Kidney transplant recipient. Type of Study: Bedside Swallow Evaluation Previous Swallow Assessment: no Diet Prior to this Study: NPO;Cortrak/Small bore NG tube Temperature Spikes Noted: No Respiratory Status: Room air History of Recent Intubation: No Behavior/Cognition: Lethargic/Drowsy Oral Cavity Assessment: Within Functional Limits Oral Care Completed by SLP: Recent completion by staff Oral Cavity - Dentition: Adequate natural dentition Vision: Functional for self-feeding Self-Feeding Abilities: Needs assist Patient Positioning: Upright in bed Baseline Vocal Quality: Normal Volitional Cough: Strong Volitional Swallow: Able to elicit    Oral/Motor/Sensory Function Overall Oral Motor/Sensory Function: Within functional limits   Ice Chips Ice chips: Within functional limits   Thin Liquid Thin Liquid: Within functional limits    Nectar Thick Nectar Thick Liquid: Not tested   Honey Thick Honey Thick Liquid: Not tested   Puree Puree: Within functional limits   Solid     Solid: Within functional limits      Vona Palma Laurice 12/29/2023,10:08 AM  Palma L. Vona, MA CCC/SLP Clinical Specialist - Acute Care SLP Acute Rehabilitation Services Office number [phone]

## 2023-12-29 NOTE — Evaluation (Signed)
 Occupational Therapy Evaluation Patient Details Name: Rhonda [name] MRN: [id number] DOB: 03-06-1955 Today's Date: 12/29/2023   History of Present Illness   68 yo female presents 11/5 for decreased state of consciousness. found to be in septic shock from Pseudomonas UTI and was found to have pseudomonal bacteremia. 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 PMH:  Diabetes mellitus without complication (HCC), bladder cancer, Hyperlipidemia, Hypertension, and Kidney transplant recipient.     Clinical Impressions Pt greeted in supine, initially asleep but awakens easily. Supportive spouse at bedside. Pt very lethargic today, but wakefulness improving with OOB and continual stim. PTA, pt was fully independent with ADLs, IADLs, and enjoys walking daily. She presents today in the ICU significantly deconditioned. She required max A for bed mobility and  min-mod A to sustain sitting at EOB. Stood and step pivoted to chair with mod A +2; incontinent of bowel upon standing requiring total A for hygiene. Anticipated mod A for UB ADLs and total A for LB ADLs given presentation and lethargy. Pt left upright in chair, hemodynamics stable. RN updated and present to assist during session.   Pt is currently functioning below baseline and would benefit from ongoing acute OT services to progress towards safe discharge and to facilitate return to prior level of function. Current recommendation is high-intensity post-acute rehab (> 3 hours/day).     If plan is discharge home, recommend the following:   Two people to help with walking and/or transfers;Two people to help with bathing/dressing/bathroom;Assistance with feeding;Direct supervision/assist for medications management;Direct supervision/assist for financial management;Assist for transportation;Supervision due to cognitive status     Functional Status Assessment   Patient has had a recent decline in their functional  status and demonstrates the ability to make significant improvements in function in a reasonable and predictable amount of time.     Equipment Recommendations   Other (comment) (defer to next level of care)     Recommendations for Other Services   Rehab consult     Precautions/Restrictions   Precautions Precautions: Fall Recall of Precautions/Restrictions: Impaired Restrictions Weight Bearing Restrictions Per Provider Order: No     Mobility Bed Mobility Overal bed mobility: Needs Assistance Bed Mobility: Supine to Sit     Supine to sit: Max assist, HOB elevated, Used rails     General bed mobility comments: Exited to the R side, needs assist for LE mgmt and trunk elevation, cues to initiate and to come to fully sitting upright.    Transfers Overall transfer level: Needs assistance Equipment used: 2 person hand held assist Transfers: Sit to/from Stand, Bed to chair/wheelchair/BSC Sit to Stand: Mod assist     Step pivot transfers: Mod assist, +2 physical assistance, +2 safety/equipment     General transfer comment: Stood from bed with cues for powering up and sequencing. Pt with significant anterior truncal and cervical flexion in stance 2/2 lethargy. Knee instability during transfer to chair with uncontrolled descent observed.      Balance Overall balance assessment: Needs assistance Sitting-balance support: Single extremity supported, Feet supported Sitting balance-Leahy Scale: Poor Sitting balance - Comments: min-mod A with brief bouts of CGA to remain upright, sitting balance worsesn as she fatigues; fell over onto L elbow reporting I need to lay down for a minute unprompted by OT   Standing balance support: Bilateral upper extremity supported, During functional activity Standing balance-Leahy Scale: Poor Standing balance comment: mod A +2 to remain upright with B knee instability and worsening standing posture as she  fatigued                            ADL either performed or assessed with clinical judgement   ADL Overall ADL's : Needs assistance/impaired Eating/Feeding: Moderate assistance;Cueing for sequencing;Bed level Eating/Feeding Details (indicate cue type and reason): Cortrak intact for supplemental feeds Grooming: Moderate assistance;Sitting           Upper Body Dressing : Moderate assistance   Lower Body Dressing: Total assistance       Toileting- Clothing Manipulation and Hygiene: Total assistance Toileting - Clothing Manipulation Details (indicate cue type and reason): neph tube & purewick intact; pt incontinent of bowel (without knowledge of this happening) and required total A for peri hygiene     Functional mobility during ADLs: Moderate assistance;+2 for physical assistance;Cueing for safety       Vision Baseline Vision/History:  (no eye glasses present at time of OT eval) Ability to See in Adequate Light: 0 Adequate Patient Visual Report: No change from baseline       Perception         Praxis         Pertinent Vitals/Pain Pain Assessment Pain Assessment: Faces Faces Pain Scale: No hurt     Extremity/Trunk Assessment Upper Extremity Assessment Upper Extremity Assessment: Generalized weakness (significant deconditioning)           Communication Communication Communication: Impaired Factors Affecting Communication: Difficulty expressing self;Reduced clarity of speech (suspect 2/2 lethargy)   Cognition Arousal: Lethargic Behavior During Therapy: Flat affect, Impulsive Cognition: Cognition impaired   Orientation impairments: Time (re-oriented appropriately) Awareness: Online awareness impaired Memory impairment (select all impairments): Short-term memory Attention impairment (select first level of impairment): Sustained attention Executive functioning impairment (select all impairments): Organization, Problem solving OT - Cognition Comments: returning self back to supine  unprompted by therapist, perseverates on it's amazing how weak you can get, redirectable & motivated to work with OT despite significant lethargy                 Following commands: Impaired Following commands impaired: Follows one step commands with increased time, Follows one step commands inconsistently     Cueing  General Comments   Cueing Techniques: Verbal cues;Gestural cues;Tactile cues;Visual cues  BP supine: 114/72 (85), BP seated in chair post-mobility: 106/77 (87), SpO2 96% on RA, HR 108 bpm   Exercises     Shoulder Instructions      Home Living Family/patient expects to be discharged to:: Private residence Living Arrangements: Spouse/significant other Available Help at Discharge: Family;Available 24 hours/day Type of Home: Apartment Home Access: Stairs to enter [employer] of Steps: FF Entrance Stairs-Rails: Right Home Layout: One level     Bathroom Shower/Tub: Chief Strategy Officer: Standard Bathroom Accessibility: No   Home Equipment: None   Additional Comments: lives on second floor of apartment      Prior Functioning/Environment Prior Level of Function : Independent/Modified Independent;Driving;History of Falls (last six months)             Mobility Comments: Husband reports pt very active until she became septic about 10 weeks ago and was hospitalized, lost a lot of weight and strength. Since returning home, she had regained about 7lbs and was walking for exercise again but was not quite back to baseline before this infection set in. ADLs Comments: indep, manages own medications & does laundry/cooking    OT Problem List: Decreased strength;Decreased range of motion;Decreased  activity tolerance;Impaired balance (sitting and/or standing);Decreased cognition;Decreased safety awareness   OT Treatment/Interventions: Self-care/ADL training;Therapeutic exercise;Therapeutic activities;Cognitive  remediation/compensation;Energy conservation;DME and/or AE instruction;Patient/family education;Balance training      OT Goals(Current goals can be found in the care plan section)   Acute Rehab OT Goals Patient Stated Goal: get stronger OT Goal Formulation: With patient/family Time For Goal Achievement: 01/12/24 Potential to Achieve Goals: Good   OT Frequency:  Min 2X/week    Co-evaluation              AM-PAC OT 6 Clicks Daily Activity     Outcome Measure Help from another person eating meals?: A Lot Help from another person taking care of personal grooming?: A Lot Help from another person toileting, which includes using toliet, bedpan, or urinal?: Total Help from another person bathing (including washing, rinsing, drying)?: A Lot Help from another person to put on and taking off regular upper body clothing?: A Lot Help from another person to put on and taking off regular lower body clothing?: Total 6 Click Score: 10   End of Session Nurse Communication: Mobility status  Activity Tolerance: Patient tolerated treatment well;Patient limited by lethargy Patient left: in chair;with call bell/phone within reach;with family/visitor present;with SCD's reapplied (chair alarm pad in place, RN aware, husband at bedside)  OT Visit Diagnosis: Unsteadiness on feet (R26.81);Muscle weakness (generalized) (M62.81);History of falling (Z91.81);Feeding difficulties (R63.3);Other symptoms and signs involving cognitive function                Time: 8756-8687 OT Time Calculation (min): 29 min Charges:  OT General Charges $OT Visit: 1 Visit OT Evaluation $OT Eval Moderate Complexity: 1 Mod OT Treatments $Therapeutic Activity: 8-22 mins  Marianny Goris M. [place], OTR/L [facility] Rehabilitation [facility] [phone] Secure Chat Preferred  Jamilett Ferrante 12/29/2023, 4:19 PM

## 2023-12-29 NOTE — Consult Note (Addendum)
 Physical Medicine and Rehabilitation Consult Reason for Consult:Rehab Referring Physician: Dr. Dorn    HPI: Rhonda [name] is a 68 y.o. female with past medical history of bladder cancer with nephrostomy tube, hypertension, hyperlipidemia, osteoporosis, CKD who was admitted after she became lethargic and had nausea vomiting and diarrhea.  She was admitted to the ICU with septic shock due to complicated UTI and bacteremia.  She also had acute metabolic encephalopathy with agitation.  Patient had nonconvulsive status epilepticus, on antiepileptic medications per neurology.  Patient also treated for acute hypoxic respiratory failure, AKI, electrolyte abnormalities.  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  Neurology feels this is likely incidental finding, started on aspirin.  Has a core track in place.  Seen by SLP not felt to have signs and symptoms of aspiration.  Patient was seen by PT and felt to be a candidate for inpatient rehabilitation.    Husband can assist 24/7 after discharge, 13 steps to enter, 1 level home    Home: Home Living Family/patient expects to be discharged to:: Private residence Living Arrangements: Spouse/significant other Available Help at Discharge: Family, Available 24 hours/day Type of Home: Apartment Home Access: Stairs to enter [employer] of Steps: 13 Entrance Stairs-Rails: Right Home Layout: One level Bathroom Shower/Tub: Engineer, [employer] Systems: Standard Bathroom Accessibility: No Home Equipment: None Additional Comments: Lives on second floor of apartment  Functional History: Prior Function Prior Level of Function : Independent/Modified Independent, Driving, History of Falls (last six months) Mobility Comments: Husband reports pt very active until she became septic about 10 weeks ago and was hospitalized, lost a lot of weight and strength. Since returning home she has regained about 7lbs and  is walking for exercise again but was not quite back to baseline before this infection set in. She is independent and driving at baseline. ADLs Comments: Ind Functional Status:  Mobility: Bed Mobility Overal bed mobility: Needs Assistance Bed Mobility: Rolling, Sidelying to Sit, Sit to Sidelying Rolling: Max assist, Used rails Sidelying to sit: Max assist, HOB elevated, Used rails Sit to sidelying: Max assist, HOB elevated, Used rails General bed mobility comments: Requires hand over hand assist and verbal cues for reaching towards rails, max assist to roll but pt able to move LEs out of bed with facilitation for sequencing, and bring 1 leg into bed fully on her own. Required max assist for trunk and head support with transitions. Transfers Overall transfer level: Needs assistance Equipment used: 1 person hand held assist Transfers: Sit to/from Stand Sit to Stand: Mod assist General transfer comment: Fair power up to stand x2 from edge of bed. Pt requires assist for balance, trunk and neck support. Difficulty keeping head upright without external support although her eyes are open tracking for targets when cued. Lightly begins to buckle as she fatigues. Ambulation/Gait General Gait Details: Unsafe to attempt until more alert.    ADL:    Cognition: Cognition Orientation Level: Disoriented to situation, Disoriented to time Cognition Arousal: Lethargic Behavior During Therapy: Flat affect, Impulsive   Review of Systems  Constitutional:  Negative for chills and fever.  HENT:  Negative for hearing loss.   Eyes:  Negative for blurred vision and double vision.  Respiratory:  Negative for shortness of breath.   Cardiovascular:  Negative for chest pain.  Gastrointestinal:  Negative for abdominal pain, nausea and vomiting.  Genitourinary:  Positive for dysuria.  Musculoskeletal:  Negative for back pain, joint pain and neck pain.  Skin:  Negative for rash.  Neurological:  Positive for  seizures and weakness. Negative for sensory change.  Psychiatric/Behavioral:  The patient has insomnia.    Past Medical History:  Diagnosis Date   CKD (chronic kidney disease), stage III (HCC)    DM (diabetes mellitus), type 2 (HCC)    H/O transurethral resection of bladder tumor (TURBT)    Hyperlipidemia    Hypertension    Kidney transplant recipient    vesicoureteral reflux   Microcytic anemia    Osteoporosis    Urothelial cancer (HCC)    Past Surgical History:  Procedure Laterality Date   COLONOSCOPY     Kidney transplant 2001 N/A    TRANSURETHRAL RESECTION OF BLADDER TUMOR WITH GYRUS (TURBT-GYRUS)  2025   2008 & 2025   Family History  Problem Relation Age of Onset   Heart disease Mother    Diabetes Father    Stroke Father    Heart disease Sister    Breast cancer Sister    Ovarian cancer Sister    Colon cancer Neg Hx    Esophageal cancer Neg Hx    Pancreatic cancer Neg Hx    Stomach cancer Neg Hx    Social History:  reports that she quit smoking about 40 years ago. Her smoking use included cigarettes. She has never used smokeless tobacco. She reports that she does not currently use alcohol. She reports that she does not use drugs. Allergies:  Allergies  Allergen Reactions   Sulfa Antibiotics Rash   Medications Prior to Admission  Medication Sig Dispense Refill   lovastatin (MEVACOR) 20 MG tablet TAKE 1 TABLET(20 MG) BY MOUTH AT BEDTIME     predniSONE (DELTASONE) 10 MG tablet Take 10 mg by mouth daily.     tacrolimus (PROGRAF) 1 MG capsule Take 2-3 mg by mouth See admin instructions. Take 3 capsules by mouth in the morning and 2 capsules at night     Cholecalciferol (VITAMIN D -3) 25 MCG (1000 UT) CAPS Take 1,000 capsules by mouth daily.     cyanocobalamin (VITAMIN B12) 1000 MCG tablet Take 1,000 mcg by mouth every other day.       Blood pressure 130/76, pulse 98, temperature 98.1 F (36.7 C), temperature source Axillary, resp. rate 20, height 5' 3 (1.6 m),  weight 45.2 kg, SpO2 96%. Physical Exam  General: No apparent distress, sitting in bedside chair HEENT: Head is normocephalic, atraumatic, sclera anicteric, oral mucosa pink and moist, cortrak in place, dentition intact Neck: Supple Heart: Reg rate and rhythm. No murmurs rubs or gallops Chest: CTA bilaterally without wheezes, rales, or rhonchi; no distress Abdomen: Soft, non-tender, non-distended, bowel sounds positive. Extremities: No clubbing, cyanosis, or edema. Pulses are 2+ Psych: Drowsy and flat Skin:  Bruising R>L UEs Neuro:    Mental Status: Drowsy appearing, AAO to person, place and year, not situation, month or day, delayed responses Speech/Languate: Naming and repetition intact, fluent, follows simple commands CRANIAL NERVES: II: PERRL. III, IV, VI: EOM intact(required a lot of encouragement to complete) V: normal sensation bilaterally VII: no asymmetry VIII: normal hearing to speech IX, X: normal palatal elevation XI: head turn intact b/l  XII: Tongue midline   MOTOR: RUE: 3-4 - out of 5 LUE: 3-4 - out of 5 RLE: HF 2+/5, KE 3/5, ADF 4-/5, APF 4-/5 LLE: HF 2+/5, KE 3/5, ADF 4-/5, APF 4-/5 Exam limited by drowsiness, appears effort limited  REFLEXES: No ankle clonus SENSORY: Normal to touch all 4 extremities  Coordination: No  ataxia or dysmetria noted    Results for orders placed or performed during the hospital encounter of 12/22/23 (from the past 24 hours)  Brain natriuretic peptide     Status: Abnormal   Collection Time: 12/28/23 11:02 AM  Result Value Ref Range   B Natriuretic Peptide 1,901.1 (H) 0.0 - 100.0 pg/mL  Glucose, capillary     Status: Abnormal   Collection Time: 12/28/23 11:22 AM  Result Value Ref Range   Glucose-Capillary 221 (H) 70 - 99 mg/dL  Glucose, capillary     Status: Abnormal   Collection Time: 12/28/23  3:16 PM  Result Value Ref Range   Glucose-Capillary 230 (H) 70 - 99 mg/dL  Glucose, capillary     Status: Abnormal    Collection Time: 12/28/23  7:25 PM  Result Value Ref Range   Glucose-Capillary 182 (H) 70 - 99 mg/dL   Comment 1 Notify RN   Glucose, capillary     Status: Abnormal   Collection Time: 12/28/23 11:39 PM  Result Value Ref Range   Glucose-Capillary 198 (H) 70 - 99 mg/dL   Comment 1 Notify RN   Glucose, capillary     Status: Abnormal   Collection Time: 12/29/23  3:37 AM  Result Value Ref Range   Glucose-Capillary 150 (H) 70 - 99 mg/dL   Comment 1 Notify RN   CBC     Status: Abnormal   Collection Time: 12/29/23  3:43 AM  Result Value Ref Range   WBC 15.4 (H) 4.0 - 10.5 K/uL   RBC 3.83 (L) 3.87 - 5.11 MIL/uL   Hemoglobin 11.9 (L) 12.0 - 15.0 g/dL   HCT 62.5 63.9 - 53.9 %   MCV 97.7 80.0 - 100.0 fL   MCH 31.1 26.0 - 34.0 pg   MCHC 31.8 30.0 - 36.0 g/dL   RDW 85.7 88.4 - 84.4 %   Platelets 91 (L) 150 - 400 K/uL   nRBC 0.2 0.0 - 0.2 %  Renal function panel     Status: Abnormal   Collection Time: 12/29/23  3:43 AM  Result Value Ref Range   Sodium 146 (H) 135 - 145 mmol/L   Potassium 3.8 3.5 - 5.1 mmol/L   Chloride 102 98 - 111 mmol/L   CO2 31 22 - 32 mmol/L   Glucose, Bld 164 (H) 70 - 99 mg/dL   BUN 63 (H) 8 - 23 mg/dL   Creatinine, Ser 8.39 (H) 0.44 - 1.00 mg/dL   Calcium 8.6 (L) 8.9 - 10.3 mg/dL   Phosphorus 2.0 (L) 2.5 - 4.6 mg/dL   Albumin 2.4 (L) 3.5 - 5.0 g/dL   GFR, Estimated 35 (L) >60 mL/min   Anion gap 13 5 - 15  Magnesium     Status: Abnormal   Collection Time: 12/29/23  3:43 AM  Result Value Ref Range   Magnesium 1.6 (L) 1.7 - 2.4 mg/dL  Glucose, capillary     Status: Abnormal   Collection Time: 12/29/23  7:08 AM  Result Value Ref Range   Glucose-Capillary 143 (H) 70 - 99 mg/dL   VAS US  CAROTID Result Date: 12/28/2023 Carotid Arterial Duplex Study Patient Name:  Rhonda [name] [facility]  Date of Exam:   12/28/2023 Medical Rec #: [id number]               Accession #:    [id number] Date of Birth: 31-Mar-1955                Patient Gender: F Patient Age:  68 years Exam  Location:  [facility] Procedure:      VAS US  CAROTID Referring Phys: PRIYANKA YADAV --------------------------------------------------------------------------------  Indications:      CVA. Risk Factors:     Hypertension, hyperlipidemia, Diabetes, past history of                   smoking. Comparison Study: No prior exam. Performing Technologist: Edilia Elden Appl  Examination Guidelines: A complete evaluation includes B-mode imaging, spectral Doppler, color Doppler, and power Doppler as needed of all accessible portions of each vessel. Bilateral testing is considered an integral part of a complete examination. Limited examinations for reoccurring indications may be performed as noted.  Right Carotid Findings: +----------+--------+--------+--------+-------------------------+--------+           PSV cm/sEDV cm/sStenosisPlaque Description       Comments +----------+--------+--------+--------+-------------------------+--------+ CCA Prox  61      15                                                +----------+--------+--------+--------+-------------------------+--------+ CCA Distal47      17              heterogenous and calcific         +----------+--------+--------+--------+-------------------------+--------+ ICA Prox  29      10              heterogenous and calcific         +----------+--------+--------+--------+-------------------------+--------+ ICA Mid   56      24                                                +----------+--------+--------+--------+-------------------------+--------+ ICA Distal47      13                                                +----------+--------+--------+--------+-------------------------+--------+ ECA       79      15                                                +----------+--------+--------+--------+-------------------------+--------+ +----------+--------+-------+----------------+-------------------+           PSV cm/sEDV  cmsDescribe        Arm Pressure (mmHG) +----------+--------+-------+----------------+-------------------+ Subclavian100     12     Multiphasic, WNL                    +----------+--------+-------+----------------+-------------------+ +---------+--------+--+--------+--+---------+ VertebralPSV cm/s36EDV cm/s11Antegrade +---------+--------+--+--------+--+---------+  Left Carotid Findings: +----------+--------+--------+--------+-------------------------+--------+           PSV cm/sEDV cm/sStenosisPlaque Description       Comments +----------+--------+--------+--------+-------------------------+--------+ CCA Prox  56      12                                                +----------+--------+--------+--------+-------------------------+--------+ CCA Distal54  15              heterogenous and calcific         +----------+--------+--------+--------+-------------------------+--------+ ICA Prox  49      22              heterogenous and calcific         +----------+--------+--------+--------+-------------------------+--------+ ICA Mid   51      18                                                +----------+--------+--------+--------+-------------------------+--------+ ICA Distal78      32                                                +----------+--------+--------+--------+-------------------------+--------+ ECA       52      12                                                +----------+--------+--------+--------+-------------------------+--------+ +----------+--------+--------+----------------+-------------------+           PSV cm/sEDV cm/sDescribe        Arm Pressure (mmHG) +----------+--------+--------+----------------+-------------------+ Subclavian99      18      Multiphasic, WNL                    +----------+--------+--------+----------------+-------------------+ +---------+--------+--+--------+--+---------+ VertebralPSV cm/s36EDV  cm/s11Antegrade +---------+--------+--+--------+--+---------+   Summary: Right Carotid: Velocities in the right ICA are consistent with a 1-39% stenosis. Left Carotid: Velocities in the left ICA are consistent with a 1-39% stenosis. Vertebrals:  Bilateral vertebral arteries demonstrate antegrade flow. Subclavians: Normal flow hemodynamics were seen in bilateral subclavian              arteries. *See table(s) above for measurements and observations.  Electronically signed by Penne Colorado MD on 12/28/2023 at 5:05:15 PM.    Final    DG Chest Port 1 View Result Date: 12/28/2023 CLINICAL DATA:  Hypoxia. EXAM: PORTABLE CHEST 1 VIEW COMPARISON:  12/24/2023 FINDINGS: The cardiopericardial silhouette is within normal limits for size. Marked improvement in aeration of both lungs with moderate left pleural effusion seen previously largely resolved in the interval. Small persistent right pleural effusion evident with bibasilar atelectasis/infiltrate. Telemetry leads overlie the chest. A feeding tube passes into the stomach although the distal tip position is not included on the film. IMPRESSION: Marked improvement in aeration of both lungs with small persistent right pleural effusion and bibasilar atelectasis/infiltrate. Electronically Signed   By: Camellia Candle M.D.   On: 12/28/2023 07:24   MR BRAIN W WO CONTRAST Result Date: 12/27/2023 EXAM: MRI BRAIN WITH AND WITHOUT CONTRAST 12/27/2023 05:49:53 PM TECHNIQUE: Multiplanar multisequence MRI of the head/brain was performed with and without the administration of intravenous contrast. 5 mL (gadobutrol (GADAVIST) 1 MMOL/ML injection 5 mL GADOBUTROL 1 MMOL/ML IV SOLN) was administered. COMPARISON: CT head 03/03/2023. CLINICAL HISTORY: Seizure, new-onset, no history of trauma. FINDINGS: BRAIN AND VENTRICLES: No acute infarct. No acute intracranial hemorrhage. No mass effect or midline shift. No hydrocephalus. The sella is unremarkable. Normal flow voids. There is a  punctate focus of diffusion  signal abnormality in the right centrum semiovale just superior to the body of the corpus callosum and just lateral to the cingulate gyrus. There is no definite ADC signal abnormality suggesting subacute chronicity. No significant associated edema. There is minimal FLAIR hyperintensity involving the periventricular white matter likely related to chronic microvascular ischemic changes. Normal appearance of the hippocampi and mesial temporal lobes. Hippocampal architecture is preserved. There is no evidence of signal abnormality in the mesial temporal lobes. The fornices are symmetric. No abnormal intracranial enhancement. ORBITS: Bilateral lens replacement. SINUSES: Focal mucosal thickening in the left posterior ethmoid air cells. Bilateral mastoid effusions. BONES AND SOFT TISSUES: Normal bone marrow signal and enhancement. No acute soft tissue abnormality. IMPRESSION: 1. Punctate focus of subacute infarct in the right centrum semiovale. No significant edema or midline shift. 2. Minimal periventricular white matter FLAIR hyperintensity, likely chronic microvascular ischemic change. 3. No abnormal intracranial enhancement. Electronically signed by: Donnice Mania MD 12/27/2023 07:02 PM EST RP Workstation: [id number]    Assessment/Plan: Diagnosis: Encephalopathy likely toxic metabolic in addition to nonconvulsive status epilepticus Does the need for close, 24 hr/day medical supervision in concert with the patient's rehab needs make it unreasonable for this patient to be served in a less intensive setting? Yes Co-Morbidities requiring supervision/potential complications:  - Subacute ischemic stroke, insomnia, AKI, right nephrostomy tube, history of renal transplant, hypophosphatemia, hypomagnesemia, hypernatremia, UTI and bacteremia, thrombocytopenia, transaminitis, hyperlipidemia, hyperglycemia, malnutrition Due to bladder management, bowel management, safety, skin/wound care, disease  management, medication administration, pain management, and patient education, does the patient require 24 hr/day rehab nursing? Yes Does the patient require coordinated care of a physician, rehab nurse, therapy disciplines of PT OT and SLP to address physical and functional deficits in the context of the above medical diagnosis(es)? Yes Addressing deficits in the following areas: balance, endurance, locomotion, strength, transferring, bowel/bladder control, bathing, dressing, feeding, grooming, toileting, cognition, speech, language, and psychosocial support Can the patient actively participate in an intensive therapy program of at least 3 hrs of therapy per day at least 5 days per week? Yes The potential for patient to make measurable gains while on inpatient rehab is excellent Anticipated functional outcomes upon discharge from inpatient rehab are supervision and min assist  with PT, supervision and min assist with OT, supervision and min assist with SLP. Estimated rehab length of stay to reach the above functional goals is: 7 to 10 days Anticipated discharge destination: Home Overall Rehab/Functional Prognosis: excellent  POST ACUTE RECOMMENDATIONS: This patient's condition is appropriate for continued rehabilitative care in the following setting: CIR Patient has agreed to participate in recommended program. Yes Note that insurance prior authorization may be required for reimbursement for recommended care.  Comment: I think patient would be a good candidate for CIR, rehab coordinator to follow-up.   MEDICAL RECOMMENDATIONS: Husband reported she had trouble sleeping last night.  If continues could consider medication such as trazodone at night for sleep-wake regulation.   I have personally performed a face to face diagnostic evaluation of this patient. Additionally, I have examined the patient's medical record including any pertinent labs and radiographic images.    Thanks,  Murray Collier, MD 12/29/2023

## 2023-12-29 NOTE — Progress Notes (Signed)
 NAME:  Rhonda [name], MRN:  [id number], DOB:  08-27-1955, LOS: 7 ADMISSION DATE:  12/22/2023, CONSULTATION DATE:  11/5 REFERRING MD:  EDP, CHIEF COMPLAINT:  hypotension   History of Present Illness:  68 yo female presents to decreased state of consciousness. Pt reportedly normally alert and oriented x4. Today EMS was called as over the past 24 hours pt has been more lethargic. There is no history of known sick contact, no fever/chills, no ha/cp/dizziness. Pt's husband at bedside and provides history. States that she was in her normal state of health, activity level and mentation until this am. She awoke feeling sick but able to still mobilize and communicate as normal. As the day progressed pt began feeling nauseous, vomiting and diarrhea. She also became more lethargic and unable to speak with husband or communicate for which he contacted ems.   He states that she had a episode like this about 1 month ago and has had nephrostomy tube placed 2/2 hydronephrosis found 2/2 tumor mass. He endorses that abx no longer seem to work for her, he feels frustrated that she does not seem to be improving and that she continues to get sick. I attempted to provide support.   Pt is extremely mobile/restless in bed. Not following commands and non communicative. She does not appear to be in any pain just overall restless.   Ccm was asked to admit pt 2/2 hypotension and need for vasopressor support.   Pertinent  Medical History  Bladder cancer Nephrostomy tube Htn Hyperlipidemia Osteoporosis Ckd 2  Significant Hospital Events: Including procedures, antibiotic start and stop dates in addition to other pertinent events   11/5 Admitted to icu  11/6 Admitted overnight, Pressor requirements have significant improved. Remains quite encephalopathic, agitated.  11/7 drastic increase in supplemental oxygen demand overnight now on 60 FiO2 with 45 L nasal cannula.  Remains confused and agitated 11/9  cefepime changed to zosyn, cEEG c/w subclinical seizures, stopped after ativan and keppra load, neurology consulted 11/10 ongoing cEEG, vimpat added, MRI   Interim History / Subjective:  More awake today and following commands   Objective    Blood pressure 135/85, pulse 90, temperature 98.1 F (36.7 C), temperature source Axillary, resp. rate 13, height 5' 3 (1.6 m), weight 45.2 kg, SpO2 93%.    FiO2 (%):  [40 %] 40 %   Intake/Output Summary (Last 24 hours) at 12/29/2023 0827 Last data filed at 12/29/2023 0600 Gross per 24 hour  Intake 2083.52 ml  Output 1775 ml  Net 308.52 ml   Filed Weights   12/27/23 0301 12/28/23 0500 12/29/23 0500  Weight: 46.3 kg 45.4 kg 45.2 kg    Examination: General:  ill appearing older female lying in bed in NAD HEENT: MM pink/moist, pupils 3/r, EEG leads in place Neuro:  arouses to name, follows simple commands in all extremities, oriented to name CV: rr, NSR, +2 pulses PULM:  non labored, clear and diminished in bases GI: soft, bs+, NT  Extremities: warm/dry, no LE edema  Skin: no rashes    Afebrile UOP 650 ml/ 24hrs CBG 160-214  Labs>  CXR> better aeration, residual small right pleural effusion, atelectasis vs infiltrate   MRI brain w/wo 11/10>  1. Punctate focus of subacute infarct in the right centrum semiovale. No significant edema or midline shift. 2. Minimal periventricular white matter FLAIR hyperintensity, likely chronic microvascular ischemic change. 3. No abnormal intracranial enhancement  Resolved problem list  Lactic acidosis  Septic Shock Hypokalemia  Assessment and Plan  Nonconvulsive status epilepticus, resolved Acute encephalopathy, suspected due to sepsis and cefepime neurotoxicity  Subacute ischemic CVA - ammonia normal 11/9.  Last dose cefepime 11/8 - off precedex 11/8 P:  - Improving mental status - MRI brain 11/10 as above - appreciate neurology assistance> signed off 11/11 - per neuro recs can stop  vimpat in 2-3 days and gradual wean off keppra as mental status continues to improve given SE was provoked  - adding ASA, checking lipid panel and A1c, carotid dopplers  - serial neuro exams - seizure precautions - PT/ OT/ SLP - NPO, using cortrak for now till sustained alertness for bedside swallow screen   Pseudomonal UTI and bacteremia - TTE 11/7 EF 55-60%, no RWMA, normal RV, mod to severe MR, mod TR, mod AR- previous TTE 09/24/23 showing EF 55-60%, trace MR, mild TR, normal AV - off pressors 11/8 P:  - cont zosyn for l0 days, stop date 11/14 - trend WBC/ fever curve - follow repeat BC - eventually will need f/u with cardiology for echo changes   Acute hypoxic respiratory failure  - cont to wean supplemental O2 for sat goal > 92 - weaned to room air currently - aggressive pulm hygiene - IS, mobilize   AKI, improving History of bladder CA with obstructive uropathy and R nephrostomy tube in place Hx of renal transplant 2001 Hypophosphatemia Hypomagnesemia Hypernatremia - 11/5 CT renal showed improved resolution of previously seen hydronephrosis, percutaneous nephrostomy in place P:  - discussed with Dr. 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  Suspect more related to sepsis, hx of bladder Ddx include TTP  P:  - plts improving, suspect related to sepsis  - DIC panel neg, LDH elevated, pending haptoglobin and adams 13 - trend on CBC   Transaminitis - LFTs continue to improve, trend periodically     HLD - restart statin   Hyperglycemia  - increase prn SSI to moderate - goal 140-180, CBG q4 - pending A1c   Protein calorie malnutrition - EN per RD - thiamine   Husband updated at bedside 11/12   Dorn Chill, MD [facility] Office: [phone]   See Amion for personal pager PCCM on call pager [phone] until 7pm. Please call Elink 7p-7a. [phone]

## 2023-12-29 NOTE — Plan of Care (Signed)

## 2023-12-29 NOTE — Plan of Care (Signed)
  Problem: Education: Goal: Knowledge of General Education information will improve Description: Including pain rating scale, medication(s)/side effects and non-pharmacologic comfort measures Outcome: Progressing   Problem: Clinical Measurements: Goal: Ability to maintain clinical measurements within normal limits will improve Outcome: Progressing Goal: Will remain free from infection Outcome: Progressing Goal: Diagnostic test results will improve Outcome: Progressing Goal: Respiratory complications will improve Outcome: Progressing Goal: Cardiovascular complication will be avoided Outcome: Progressing   Problem: Safety: Goal: Ability to remain free from injury will improve Outcome: Progressing   Problem: Nutritional: Goal: Maintenance of adequate nutrition will improve Outcome: Progressing   Problem: Skin Integrity: Goal: Risk for impaired skin integrity will decrease Outcome: Progressing   Problem: Medication: Goal: Risk for medication side effects will decrease Outcome: Progressing

## 2023-12-30 DIAGNOSIS — E785 Hyperlipidemia, unspecified: Secondary | ICD-10-CM | POA: Diagnosis not present

## 2023-12-30 DIAGNOSIS — N179 Acute kidney failure, unspecified: Secondary | ICD-10-CM | POA: Diagnosis not present

## 2023-12-30 DIAGNOSIS — D696 Thrombocytopenia, unspecified: Secondary | ICD-10-CM

## 2023-12-30 DIAGNOSIS — R569 Unspecified convulsions: Secondary | ICD-10-CM

## 2023-12-30 DIAGNOSIS — N390 Urinary tract infection, site not specified: Secondary | ICD-10-CM | POA: Diagnosis not present

## 2023-12-30 DIAGNOSIS — C679 Malignant neoplasm of bladder, unspecified: Secondary | ICD-10-CM

## 2023-12-30 LAB — GLUCOSE, CAPILLARY
Glucose-Capillary: 159 mg/dL — ABNORMAL HIGH (ref 70–99)
Glucose-Capillary: 171 mg/dL — ABNORMAL HIGH (ref 70–99)
Glucose-Capillary: 165 mg/dL — ABNORMAL HIGH (ref 70–99)
Glucose-Capillary: 185 mg/dL — ABNORMAL HIGH (ref 70–99)
Glucose-Capillary: 296 mg/dL — ABNORMAL HIGH (ref 70–99)
Glucose-Capillary: 164 mg/dL — ABNORMAL HIGH (ref 70–99)

## 2023-12-30 LAB — RENAL FUNCTION PANEL
Albumin: 2.3 g/dL — ABNORMAL LOW (ref 3.5–5.0)
Anion gap: 10 (ref 5–15)
BUN: 62 mg/dL — ABNORMAL HIGH (ref 8–23)
CO2: 28 mmol/L (ref 22–32)
Calcium: 7.9 mg/dL — ABNORMAL LOW (ref 8.9–10.3)
Chloride: 98 mmol/L (ref 98–111)
Creatinine, Ser: 1.51 mg/dL — ABNORMAL HIGH (ref 0.44–1.00)
GFR, Estimated: 37 mL/min — ABNORMAL LOW (ref >60)
Glucose, Bld: 101 mg/dL — ABNORMAL HIGH (ref 70–99)
Phosphorus: 4.1 mg/dL (ref 2.5–4.6)
Potassium: 4.3 mmol/L (ref 3.5–5.1)
Sodium: 136 mmol/L (ref 135–145)

## 2023-12-30 LAB — CBC
HCT: 34.4 % — ABNORMAL LOW (ref 36.0–46.0)
Hemoglobin: 11.1 g/dL — ABNORMAL LOW (ref 12.0–15.0)
MCH: 31 pg (ref 26.0–34.0)
MCHC: 32.3 g/dL (ref 30.0–36.0)
MCV: 96.1 fL (ref 80.0–100.0)
Platelets: 127 K/uL — ABNORMAL LOW (ref 150–400)
RBC: 3.58 MIL/uL — ABNORMAL LOW (ref 3.87–5.11)
RDW: 14.5 % (ref 11.5–15.5)
WBC: 17 K/uL — ABNORMAL HIGH (ref 4.0–10.5)
nRBC: 0.2 % (ref 0.0–0.2)

## 2023-12-30 MED ORDER — KATE FARMS STANDARD 1.4 EN LIQD
325.0000 mL | Freq: Two times a day (BID) | ENTERAL | Status: DC
  Administered 2023-12-30 – 2024-01-02 (×5): 325 mL via ORAL
  Filled 2023-12-30 (×11): qty 325

## 2023-12-30 MED ORDER — OSMOLITE 1.2 CAL PO LIQD: 1000.0000 mL | ORAL | Status: DC

## 2023-12-30 MED ORDER — OSMOLITE 1.2 CAL PO LIQD
600.0000 mL | ORAL | Status: DC
  Administered 2023-12-30: 600 mL
  Filled 2023-12-30 (×2): qty 711

## 2023-12-30 NOTE — Assessment & Plan Note (Signed)
 Cell count stable  DIC panel negative

## 2023-12-30 NOTE — Progress Notes (Signed)
 Occupational Therapy Treatment Patient Details Name: Rhonda [name] MRN: [id number] DOB: [birth year] Today's Date: 12/30/2023   History of present illness 68 yo F adm 12/22/23 with ALOC, UTI, septic shock, nonconvulsive status epilepticus. PMH:  DM, bladder CA, HTN, HLD, s/p Kidney transplant   OT comments  Pt progressing towards goals. Pt received in supine, very lethargic. Initially agreeable to OOB activity, but declined reporting fatigue. With encouragement, pt agreeable to bed level activities. Completed bed level exercises to improve core strength as precursor for ADLs. Pt with waxing and weaning cognition, worsening as lethargy increases. Educated pt's spouse on importance of improving sleep/wake cycle to decrease risk for delirium and improve overall cog. Also educated spouse of cog techniques such as frequent reorientation, problem solving activities, and use of blinds and relaxation techniques. Continue to recommend >3 hours of skilled rehab daily to optimize independence levels. Will continue to follow acutely.       If plan is discharge home, recommend the following:  Two people to help with walking and/or transfers;Two people to help with bathing/dressing/bathroom;Assistance with feeding;Direct supervision/assist for medications management;Direct supervision/assist for financial management;Assist for transportation;Supervision due to cognitive status   Equipment Recommendations  Other (comment) (Defer to next venue)    Recommendations for Other Services Rehab consult    Precautions / Restrictions Precautions Precautions: Fall;Other (comment) Recall of Precautions/Restrictions: Impaired Precaution/Restrictions Comments: cortrak, nephrostomy tube, incontinent B/B Restrictions Weight Bearing Restrictions Per Provider Order: No       Mobility Bed Mobility               General bed mobility comments: Pt declined    Transfers     General transfer comment: Pt  declined     Balance Overall balance assessment: Needs assistance       ADL either performed or assessed with clinical judgement   ADL Overall ADL's : Needs assistance/impaired Eating/Feeding: Set up;Bed level   Grooming: Bed level;Moderate assistance;Wash/dry face;Brushing hair       General ADL Comments: Limited d/t fatigue    Extremity/Trunk Assessment Upper Extremity Assessment Upper Extremity Assessment: Generalized weakness   Lower Extremity Assessment Lower Extremity Assessment: Defer to PT evaluation        Vision   Vision Assessment?: No apparent visual deficits         Communication Communication Communication: No apparent difficulties Factors Affecting Communication: Difficulty expressing self;Reduced clarity of speech   Cognition Arousal: Lethargic Behavior During Therapy: Flat affect Cognition: Cognition impaired   Orientation impairments: Time, Place, Situation Awareness: Online awareness impaired, Intellectual awareness impaired Memory impairment (select all impairments): Short-term memory Attention impairment (select first level of impairment): Sustained attention Executive functioning impairment (select all impairments): Organization, Problem solving OT - Cognition Comments: Pt lethargic, reporting getting meds recently. Pt with waxing and weaning cog during session. Initally reporting fatigue from earlier PT session, then within the session saying she has never walked in the hospital                 Following commands: Impaired Following commands impaired: Follows one step commands with increased time, Follows one step commands inconsistently      Cueing   Cueing Techniques: Verbal cues, Gestural cues, Tactile cues  Exercises Exercises: Other exercises Other Exercises Other Exercises: x10 reps hip bridges Other Exercises: x10 reps bed level crunches with use of bed rails       General Comments Pt with soft bp, but improved with bed  level exercises    Pertinent Vitals/ Pain  Pain Assessment Pain Assessment: No/denies pain Pain Intervention(s): Limited activity within patient's tolerance   Frequency  Min 2X/week        Progress Toward Goals  OT Goals(current goals can now be found in the care plan section)  Progress towards OT goals: Progressing toward goals  Acute Rehab OT Goals Patient Stated Goal: To get better OT Goal Formulation: With patient/family Time For Goal Achievement: 01/12/24 Potential to Achieve Goals: Good ADL Goals Pt Will Perform Eating: with set-up;sitting Pt Will Perform Grooming: with contact guard assist;sitting Pt Will Transfer to Toilet: with min assist;stand pivot transfer;bedside commode Pt Will Perform Toileting - Clothing Manipulation and hygiene: with min assist;sitting/lateral leans;sit to/from stand Pt/caregiver will Perform Home Exercise Program: Increased ROM;Increased strength;Both right and left upper extremity;With Supervision Additional ADL Goal #1: Pt will sustain sitting EOB with no more than supervision in preparation for ADLs at EOB.  Plan      AM-PAC OT 6 Clicks Daily Activity     Outcome Measure   Help from another person eating meals?: A Little Help from another person taking care of personal grooming?: A Lot Help from another person toileting, which includes using toliet, bedpan, or urinal?: Total Help from another person bathing (including washing, rinsing, drying)?: A Lot Help from another person to put on and taking off regular upper body clothing?: A Lot Help from another person to put on and taking off regular lower body clothing?: Total 6 Click Score: 11    End of Session    OT Visit Diagnosis: Unsteadiness on feet (R26.81);Muscle weakness (generalized) (M62.81);History of falling (Z91.81);Feeding difficulties (R63.3);Other symptoms and signs involving cognitive function   Activity Tolerance Patient limited by fatigue   Patient Left in  bed;with call bell/phone within reach;with bed alarm set;with family/visitor present   Nurse Communication Mobility status        Time: 1126-1206 OT Time Calculation (min): 40 min  Charges: OT General Charges $OT Visit: 1 Visit OT Treatments $Self Care/Home Management : 8-22 mins $Therapeutic Exercise: 23-37 mins  Adrianne BROCKS, OT  Acute Rehabilitation Services Office [phone] Secure chat preferred   Adrianne GORMAN Savers 12/30/2023, 3:56 PM

## 2023-12-30 NOTE — Assessment & Plan Note (Signed)
 Follow up as outpatient

## 2023-12-30 NOTE — Hospital Course (Addendum)
 Rhonda [name] was admitted to the hospital with sepsis due to urinary tract infection, complicated with septic shock.   68 yo female with the past medical history of bladder cancer, sp nephrostomy tube placement 11/04, hyperlipidemia, CKD and hypertension who presented with altered mental status. She was noted to have a rapid change in her mentation. Initially feeling very weak, with nausea, vomiting and diarrhea. EMS was called and she was found hypotensive 82/58 with HR 130 bpm, she had one liter IV fluids and transported to the ED.  On her initial physical examination in the ED her blood pressure was 75/54, HR 108, RR 27, 02 saturation 97% and temperature 102.3 F  Lungs with no wheezing or rales, heart with S1 and S2 present and tachycardic, abdomen with no distention, soft and non tender, right nephrostomy tube in place, no lower extremity edema.   Na 139, K 4.2 Cl 102 bicarbonate 11 glucose 57 bun 57 cr 4.28  AST 110 ALT 36  Wbc 16.0 hgb 11.4 plt 191  Sars covid 19 negative Influenza negative RSV negative   Urine analysis SG 1,022, protein > 300, positive nitrates, small leukocytes, small hgb, glucose 50, 11-20 wbc and 11-20 rbc.   Urine culture positive for Pseudomonas  Blood cultures positive for Pseudomonas Aeruginosa aerobic bottles x2 . (Resistant to ciprofloxacin).   Chest radiograph with no cardiomegaly, no effusions or infiltrates.   EKG 131 bpm, normal axis, normal intervals, qtc 455, sinus rhythm with no significant ST segment or T wave changes.   CT head with no acute changes.  CT renal with interval placement of percutaneous nephrostomy in the inferior right lower quadrant transplanted kidney with near resolution of the previously seen hydronephrosis. No stones or peri transplant fluid collection.  Mild stranding adjacent to the transplant kidney and urinary bladder.  Sigmoid diverticulosis with perisigmoid stranding in the pelvis.  Small bowel ileus vs developing  obstruction.  Cholelithiasis.   11/5 Admitted to icu  11/6 Admitted overnight, Pressor requirements have significant improved. Remains quite encephalopathic, agitated.  11/7 drastic increase in supplemental oxygen demand overnight now on 60 FiO2 with 45 L nasal cannula.  Remains confused and agitated 11/9 Pseudomonas bacteremia, cefepime changed to zosyn, cEEG c/w subclinical seizures, stopped after ativan and keppra load, neurology consulted 11/10 ongoing cEEG, vimpat added, MRI   11/13 transferred to [facility]. Positive diarrhea, possible due to tube feedings. Patient allowed to eat by mouth.  11/14 requested to remove cortrak  11/18 mentation continued to improve, no further seizures.  Patient has recovered well and will be discharged home to continue home health services.

## 2023-12-30 NOTE — Assessment & Plan Note (Signed)
 Continue statin therapy

## 2023-12-30 NOTE — Progress Notes (Signed)
 Inpatient Rehab Coordinator Note:  I met with patient and her spouse at bedside to discuss CIR recommendations and goals/expectations of CIR stay.  Pt sleepy, but able to awaken enough to briefly discuss rehab and she is in agreement to pursue.  We reviewed 3 hrs/day of therapy, physician follow up, and average length of stay 2 weeks (dependent upon progress) with goals of supervision.  We reviewed insurance auth process.  I discussed with Dr. Arrien and she is medically stable to move to CIR pending insurance approval and bed availability so I will start auth process today and follow.  I let pt/family and Dr. Noralee know I may not have a bed until early next week.   Reche Lowers, PT, DPT Admissions Coordinator [phone] 12/30/23 2:13 PM

## 2023-12-30 NOTE — Progress Notes (Signed)
 Nutrition Follow-up  DOCUMENTATION CODES:  Severe malnutrition in context of chronic illness, Underweight  INTERVENTION:  Continue regular diet as ordered Follows a vegetarian diet; able to make choices per her preference on current diet [employer] 1.4 PO BID, each supplement provides 455 kcal and 20 grams protein. Continue MVI with minerals daily Nocturnal tube feeds via Cortrak at least one more night prior to removal of Cortrak to ensure adequately meeting nutrition needs: Osmolite 1.2 at 50ml x12 hours (6pm-6am) Provides 720 kcal, 33g protein and free water   NUTRITION DIAGNOSIS:  Severe Malnutrition related to chronic illness (bladder cancer) as evidenced by severe fat depletion, severe muscle depletion. - remains applicable  GOAL:  Patient will meet greater than or equal to 90% of their needs - goal unmet  MONITOR:   Diet advancement, Labs, Weight trends, TF tolerance  REASON FOR ASSESSMENT:   Consult Enteral/tube feeding initiation and management  ASSESSMENT:   Pt admitted with decreased state of consciousness and hypotension d/t sepsis secondary to UTI. PMH significant for bladder cancer, nephrostomy tube, HTN, HLD, osteoporosis, CKD 2.  11/5: admitted  11/9: cEEG c/w subclinical seizures 11/10: Cortrak placed; TF initiated 11/12: bedside swallow assessment- diet advanced to regular, thin liquired  Noted plans for possible CIR admission.   Spoke with patient at bedside. She appears and endorses feeling very tired.  Pt reports having an appetite. This morning she consumed an omelette (150 kcal and 14g protein) and for lunch she had a humus and veggie tray (312 kcal and 10g protein). For dinner she reports ordering a vegetable plate (if she consumes 100% of meal this will provide 544 kcal and 8g protein).  Encouraged intake of plant based protein sources. At home she reports drinking protein shakes but then reports that she adds fruit and almond milk to a  smoothie but does not add any additional protein powder/foods to this. Pt denies intake of milk based dairy products.   She is amenable to protein supplements to augment oral intake though is concerned with cost. RD expressed importance of adequate oral intake in the setting of malnutrition and debility. With addition of a nutrition supplement to augment intake, she will likely meet minimum estimated nutrition goals no longer warranting cortrak for supplemental nutrition support. Would recommend at least one further night of nocturnal nutrition support. Discussed with MD.   First measured weight (11/6): 50.9 kg Current weight: 48.1 kg  Medications: SSI 0-15 units q4h, MVI, prednisone, thiamine, IV abx  Labs:  BUN 62 Cr 1.51 GFR 37 CBG's 159-270 x24 hours  Diet Order:   Diet Order             Diet regular Room service appropriate? Yes with Assist; Fluid consistency: Thin  Diet effective now                   EDUCATION NEEDS:   No education needs have been identified at this time  Skin:  Skin Assessment: Reviewed RN Assessment  Last BM:  11/13 type 7  Height:   Ht Readings from Last 1 Encounters:  12/29/23 5' 3 (1.6 m)    Weight:   Wt Readings from Last 1 Encounters:  12/30/23 48.1 kg   BMI:  Body mass index is 18.78 kg/m.  Estimated Nutritional Needs:   Kcal:  1400-1600  Protein:  60-75g  Fluid:  >/=1.5L  Royce Maris, RDN, LDN Clinical Nutrition See AMiON for contact information.

## 2023-12-30 NOTE — Assessment & Plan Note (Signed)
-  Nutritional supplementation.

## 2023-12-30 NOTE — Assessment & Plan Note (Addendum)
 Sp renal transplant.  Hypomagnesemia, hypoPhosphatemia, hypernatremia.  Sp right nephrostomy tube.   Continue to improve renal function with serum cr at 1,5 with K at 4.2 and serum bicarbonate at 21  Na 140  Patient tolerating po well, continue to hold on IV fluids.   Continue immunosuppressive therapy with Tacrolimus and prednisone.

## 2023-12-30 NOTE — Progress Notes (Signed)
 Physical Therapy Treatment Patient Details Name: Rhonda [name] MRN: [id number] DOB: 04/07/1955 Today's Date: 12/30/2023   History of Present Illness 68 yo F adm 12/22/23 with ALOC, UTI, septic shock, nonconvulsive status epilepticus. PMH:  DM, bladder CA, HTN, HLD, s/p Kidney transplant    PT Comments  Pt with flat affect but responding to questions and commands with delay. Pt stating shock at her change in function from baseline with assist needed for all mobility. Pt with maintained attention needing min assist to get to EOB and able to walk short distance with RW and chair follow. Pt with tray end of session and unable to get food onto fork or spoon but with assist to load utensil pt able to feed herself and daughter present to assist. Will continue to follow to progress mobility and function with pt encouraged to be OOB for 1-2 hours in chair and up for meals. Patient will benefit from intensive inpatient follow-up therapy, >3 hours/day     If plan is discharge home, recommend the following: Assistance with cooking/housework;Direct supervision/assist for medications management;Direct supervision/assist for financial management;Assist for transportation;Help with stairs or ramp for entrance;Supervision due to cognitive status;A lot of help with walking and/or transfers;A lot of help with bathing/dressing/bathroom;Assistance with feeding   Can travel by private vehicle        Equipment Recommendations  Rolling walker (2 wheels);BSC/3in1;Wheelchair (measurements PT);Wheelchair cushion (measurements PT)    Recommendations for Other Services       Precautions / Restrictions Precautions Precautions: Fall;Other (comment) Recall of Precautions/Restrictions: Impaired Precaution/Restrictions Comments: cortrak, nephrostomy tube, incontinent B/B     Mobility  Bed Mobility Overal bed mobility: Needs Assistance Bed Mobility: Supine to Sit     Supine to sit: Min assist, Used rails,  HOB elevated     General bed mobility comments: HOb 20 degrees, use of rail and increased time with min assist to elevate trunk from surface    Transfers Overall transfer level: Needs assistance   Transfers: Sit to/from Stand Sit to Stand: Mod assist, Min assist Stand pivot transfers: Mod assist         General transfer comment: cues for hand placement and safety with assist to power up to standing from bed x 3 trials and min assist to rise from recliner x 1 with use of UB. stand pivot bed to chair with reliance on RW, assist for lines and cues for sequence    Ambulation/Gait Ambulation/Gait assistance: Min assist, +2 safety/equipment Gait Distance (Feet): 6 Feet Assistive device: Rolling walker (2 wheels) Gait Pattern/deviations: Step-through pattern, Decreased stride length, Trunk flexed   Gait velocity interpretation: <1.8 ft/sec, indicate of risk for recurrent falls   General Gait Details: cues for safety, RW proximity and posture. close chair follow for safety, pt limited by fatigue   Stairs             Wheelchair Mobility     Tilt Bed    Modified Rankin (Stroke Patients Only)       Balance Overall balance assessment: Needs assistance Sitting-balance support: Feet supported, No upper extremity supported Sitting balance-Leahy Scale: Fair Sitting balance - Comments: CGA at EOB   Standing balance support: Bilateral upper extremity supported, During functional activity Standing balance-Leahy Scale: Poor Standing balance comment: RW in standing and physical assist                            Communication Communication Communication: No apparent difficulties  Cognition  Arousal: Alert Behavior During Therapy: Flat affect   PT - Cognitive impairments: Attention, Initiation, Sequencing, Problem solving, Safety/Judgement, Awareness, Orientation, Memory                       PT - Cognition Comments: pt alert, not oriented to day,  confused by situation, following commands intermittently with increased time, incontinent of bowel Following commands: Impaired Following commands impaired: Follows one step commands with increased time, Follows one step commands inconsistently    Cueing Cueing Techniques: Verbal cues, Gestural cues, Tactile cues  Exercises General Exercises - Lower Extremity Long Arc Quad: Strengthening, Both, 10 reps, Seated, AROM Hip Flexion/Marching: AROM, Strengthening, Seated, Both, 10 reps    General Comments        Pertinent Vitals/Pain Pain Assessment Pain Assessment: No/denies pain    Home Living                          Prior Function            PT Goals (current goals can now be found in the care plan section) Progress towards PT goals: Progressing toward goals    Frequency    Min 3X/week      PT Plan      Co-evaluation              AM-PAC PT 6 Clicks Mobility   Outcome Measure  Help needed turning from your back to your side while in a flat bed without using bedrails?: A Little Help needed moving from lying on your back to sitting on the side of a flat bed without using bedrails?: A Little Help needed moving to and from a bed to a chair (including a wheelchair)?: A Lot Help needed standing up from a chair using your arms (e.g., wheelchair or bedside chair)?: A Lot Help needed to walk in hospital room?: A Lot Help needed climbing 3-5 steps with a railing? : Total 6 Click Score: 13    End of Session Equipment Utilized During Treatment: Gait belt Activity Tolerance: Patient tolerated treatment well Patient left: in chair;with chair alarm set;with family/visitor present;with call bell/phone within reach Nurse Communication: Mobility status PT Visit Diagnosis: Unsteadiness on feet (R26.81);Other abnormalities of gait and mobility (R26.89);Muscle weakness (generalized) (M62.81);Difficulty in walking, not elsewhere classified (R26.2);Other symptoms and  signs involving the nervous system (R29.898)     Time: 9197-9168 PT Time Calculation (min) (ACUTE ONLY): 29 min  Charges:    $Gait Training: 8-22 mins $Therapeutic Activity: 8-22 mins PT General Charges $$ ACUTE PT VISIT: 1 Visit                     [name], PT Acute Rehabilitation Services Office: [phone]    Lenoard NOVAK Murphy Duzan 12/30/2023, 10:53 AM

## 2023-12-30 NOTE — Assessment & Plan Note (Addendum)
 Septic shock present on admission has resolved.  Pseudomonas bacteremia.   Completed antibiotic therapy with Zosyn.   Acute hypoxemic respiratory failure resolved.  02 saturation today is 98% on room air.  Improved mobility and recommendations now are for home health services.

## 2023-12-30 NOTE — Progress Notes (Addendum)
 Progress Note   Patient: Rhonda [name] FMW:[id number] DOB: 1956-02-15 DOA: 12/22/2023     8 DOS: the patient was seen and examined on 12/30/2023   Brief hospital course: Mrs. Rozzell was admitted to the hospital with sepsis due to urinary tract infection.   68 yo female with the past medical history of bladder cancer, sp nephrostomy tube placement 11/04, hyperlipidemia, CKD and hypertension who presented with altered mental status. She was noted to have a rapid change in her mentation. Initially feeling very weak, with nausea, vomiting and diarrhea. EMS was called and she was found hypotensive 82/58 with HR 130 bpm, she had one liter IV fluids and transported to the ED.  On her initial physical examination in the ED her blood pressure was 75/54, HR 108, RR 27, 02 saturation 97% and temperature 102.3 F  Lungs with no wheezing or rales, heart with S1 and S2 present and tachycardic, abdomen with no distention, soft and non tender, right nephrostomy tube in place, no lower extremity edema.   11/5 Admitted to icu  11/6 Admitted overnight, Pressor requirements have significant improved. Remains quite encephalopathic, agitated.  11/7 drastic increase in supplemental oxygen demand overnight now on 60 FiO2 with 45 L nasal cannula.  Remains confused and agitated 11/9 Pseudomonas bacteremia, cefepime changed to zosyn, cEEG c/w subclinical seizures, stopped after ativan and keppra load, neurology consulted 11/10 ongoing cEEG, vimpat added, MRI   11/13 transferred to [facility].  Assessment and Plan: * Complicated urinary tract infection Septic shock present on admission has resolved.  Pseudomonas bacteremia.   Plan to continue antibiotic therapy with Zosyn to complete 10/14 (10 days therapy)  Off IV fluids.   Acute hypoxemic respiratory failure is improving.  02 saturation today is 98% on room air.   AKI (acute kidney injury) Sp renal transplant.  Hypomagnesemia, hypoPhosphatemia,  hypernatremia.  Sp right nephrostomy tube.   Renal function with serum cr ata 1,51 with K at 4.3 and serum bicarbonate at 28  Na 136 and P 4.1   Continue immunosuppressive therapy with Tacrolimus and prednisone.   Seizure (HCC) Acute metabolic encephalopathy (sepsis and cefepime neurotoxicity), improving.  Subacute CVA.  Plan to stop vimpat 11/15 and then start to wean keepra  Continue aspirin and statin  Seizure precautions.   Improved swallow function and now able to take po.  Will plan to dc tube feedings in am.   Dyslipidemia Continue statin therapy   Thrombocytopenia Cell count stable  DIC panel negative   Bladder cancer (HCC) Follow up as outpatient   Protein-calorie malnutrition, severe Nutritional supplementation         Subjective: Patient more awake and alert, no chest pain or dyspnea, able to tolerate po   Physical Exam: Vitals:   12/30/23 0406 12/30/23 0408 12/30/23 0741 12/30/23 1055  BP:  105/75 112/77 105/66  Pulse:  95 100 96  Resp:  18 18 17   Temp:  97.6 F (36.4 C) 98.8 F (37.1 C) 97.8 F (36.6 C)  TempSrc:  Oral Oral Oral  SpO2:  98% 99% 100%  Weight: 48.1 kg     Height:       Neurology somnolent but easy to arouse, able to respond to simple questions and follow commands ENT with mild pallor, cortrack in place Cardiovascular with S1 and S2 present and regular with no gallops, rubs or murmurs Respiratory with no rales or wheezing, no rhonchi  Abdomen is soft and non tender, not distended, nephrostomy tube in place No lower extremity  edema  Data Reviewed:    Family Communication: I spoke with patient's husband at the bedside, we talked in detail about patient's condition, plan of care and prognosis and all questions were addressed.   Disposition: Status is: Inpatient Remains inpatient appropriate because: recovering sepsis   Planned Discharge Destination: Skilled nursing facility    Author: Elidia Toribio Furnace,  MD 12/30/2023 11:37 AM  For on call review [email or web address].

## 2023-12-30 NOTE — Assessment & Plan Note (Addendum)
 Acute metabolic encephalopathy (sepsis and cefepime neurotoxicity), resolved.    Subacute CVA.  Brain MRI with punctate focus of subacute infarct in the right centrum semiovale. No significant edema or midline shift.  Minimal perivascular white matter FLAIR hyperintensity.  No abnormal intracranial enhancement.   Carotid doppler US  with right ICA velocities 1-39% and left ICA velocities 1-39% Vertebrals with antegrade flow bilaterally Subclavian with normal flow hemodynamics bilaterally.   Patient was placed on Vimpat and Keppra, medications were subsequently tapered off with no complications. No further seizures.   Patient will continue medical therapy with aspirin and statin. Follow up as outpatient.

## 2023-12-31 DIAGNOSIS — R569 Unspecified convulsions: Secondary | ICD-10-CM | POA: Diagnosis not present

## 2023-12-31 DIAGNOSIS — N390 Urinary tract infection, site not specified: Secondary | ICD-10-CM | POA: Diagnosis not present

## 2023-12-31 DIAGNOSIS — N179 Acute kidney failure, unspecified: Secondary | ICD-10-CM | POA: Diagnosis not present

## 2023-12-31 DIAGNOSIS — E785 Hyperlipidemia, unspecified: Secondary | ICD-10-CM | POA: Diagnosis not present

## 2023-12-31 LAB — GLUCOSE, CAPILLARY
Glucose-Capillary: 139 mg/dL — ABNORMAL HIGH (ref 70–99)
Glucose-Capillary: 162 mg/dL — ABNORMAL HIGH (ref 70–99)
Glucose-Capillary: 116 mg/dL — ABNORMAL HIGH (ref 70–99)
Glucose-Capillary: 172 mg/dL — ABNORMAL HIGH (ref 70–99)
Glucose-Capillary: 205 mg/dL — ABNORMAL HIGH (ref 70–99)

## 2023-12-31 LAB — COMPREHENSIVE METABOLIC PANEL WITH GFR
ALT: 49 U/L — ABNORMAL HIGH (ref 0–44)
AST: 22 U/L (ref 15–41)
Albumin: 2.2 g/dL — ABNORMAL LOW (ref 3.5–5.0)
Alkaline Phosphatase: 75 U/L (ref 38–126)
Anion gap: 13 (ref 5–15)
BUN: 70 mg/dL — ABNORMAL HIGH (ref 8–23)
CO2: 25 mmol/L (ref 22–32)
Calcium: 8 mg/dL — ABNORMAL LOW (ref 8.9–10.3)
Chloride: 98 mmol/L (ref 98–111)
Creatinine, Ser: 1.91 mg/dL — ABNORMAL HIGH (ref 0.44–1.00)
GFR, Estimated: 28 mL/min — ABNORMAL LOW (ref >60)
Glucose, Bld: 129 mg/dL — ABNORMAL HIGH (ref 70–99)
Potassium: 4.4 mmol/L (ref 3.5–5.1)
Sodium: 136 mmol/L (ref 135–145)
Total Bilirubin: 0.5 mg/dL (ref 0.0–1.2)
Total Protein: 5.4 g/dL — ABNORMAL LOW (ref 6.5–8.1)

## 2023-12-31 LAB — CBC
HCT: 29.2 % — ABNORMAL LOW (ref 36.0–46.0)
Hemoglobin: 9.3 g/dL — ABNORMAL LOW (ref 12.0–15.0)
MCH: 30.6 pg (ref 26.0–34.0)
MCHC: 31.8 g/dL (ref 30.0–36.0)
MCV: 96.1 fL (ref 80.0–100.0)
Platelets: 144 K/uL — ABNORMAL LOW (ref 150–400)
RBC: 3.04 MIL/uL — ABNORMAL LOW (ref 3.87–5.11)
RDW: 14.6 % (ref 11.5–15.5)
WBC: 13.5 K/uL — ABNORMAL HIGH (ref 4.0–10.5)
nRBC: 0 % (ref 0.0–0.2)

## 2023-12-31 MED ORDER — DEXTROSE-SODIUM CHLORIDE 5-0.9 % IV SOLN: INTRAVENOUS | Status: AC

## 2023-12-31 NOTE — Progress Notes (Signed)
 Progress Note   Rhonda [name] [name]: Rhonda [name] Rhonda [name] [name] FMW:[id number] DOB: Jan 19, 1956 DOA: 12/22/2023     9 DOS: Rhonda [name] Rhonda [name] [name] was seen and examined on 12/31/2023   Brief hospital course: Rhonda [name] Rhonda [name] [name] was [name] with sepsis due to urinary tract infection.   68 yo female with Rhonda [name] past medical history of bladder cancer, sp nephrostomy tube placement 11/04, hyperlipidemia, CKD and hypertension who presented with altered mental status. Rhonda [name] Rhonda [name] [name] was noted to have a rapid change in her mentation. Initially feeling very weak, with nausea, vomiting and diarrhea. EMS was called and Rhonda [name] Rhonda [name] [name] was found hypotensive 82/58 with HR 130 bpm, Rhonda [name] Rhonda [name] [name] had one liter IV fluids and transported to [name].  On her initial physical examination in [name] ED her blood pressure was 75/54, HR 108, RR 27, 02 saturation 97% and temperature 102.3 F  Lungs with no wheezing or rales, heart with S1 and S2 present and tachycardic, abdomen with no distention, soft and non tender, right nephrostomy tube in place, no lower extremity edema.   11/5 Admitted to icu  11/6 Admitted overnight, Pressor requirements have significant improved. Remains quite encephalopathic, agitated.  11/7 drastic increase in supplemental oxygen demand overnight now on 60 FiO2 with 45 L nasal cannula.  Remains confused and agitated 11/9 Pseudomonas bacteremia, cefepime changed to zosyn, cEEG c/w subclinical seizures, stopped after ativan and keppra load, neurology consulted 11/10 ongoing cEEG, vimpat added, MRI   11/13 transferred to [facility]. Positive diarrhea, possible due to tube feedings. [name] [name] allowed to eat by mouth.  11/14 requested to remove cortrak   Assessment and Plan: * Complicated urinary tract infection Septic shock present on admission has resolved.  Pseudomonas bacteremia.   Completed antibiotic therapy today, with Zosyn.   Acute hypoxemic respiratory failure is improving.  02 saturation today is 98% on room air.  Rhonda [name] [name] very weak and  deconditioned, needs further rehab.   AKI (acute kidney injury) Sp renal transplant.  Hypomagnesemia, hypoPhosphatemia, hypernatremia.  Sp right nephrostomy tube.   Renal function with serum cr at 1,91 with K at 4.4 and serum bicarbonate at 25  Na 136  Will add gently IV fluids with isotonic saline and dextrose.   Continue immunosuppressive therapy with Tacrolimus and prednisone.   Seizure (HCC) Acute metabolic encephalopathy (sepsis and cefepime neurotoxicity), improving.  Subacute CVA.   Rhonda [name] [name] with somnolence plan to stop vimpat today and then start to wean keepra  Continue aspirin and statin  Seizure precautions.   Plan to remove cortrak and continue po intake.   Dyslipidemia Continue statin therapy   Thrombocytopenia Cell count stable  DIC panel negative   Bladder cancer (HCC) Follow up as outpatient   Protein-calorie malnutrition, severe Nutritional supplementation         Subjective: Rhonda [name] [name] with mild somnolence but able to respond to questions and follow commands. Rhonda [name] Rhonda [name] [name] would like cortrack to be removed. Rhonda [name] Rhonda [name] [name] is tolerating po well. Positive diarrhea   Physical Exam: Vitals:   12/30/23 2033 12/30/23 2317 12/31/23 0409 12/31/23 0753  BP: 100/69 112/65 124/68 133/74  Pulse: 100 97 97 97  Resp: 18 18 19 19   Temp: 98.2 F (36.8 C) 99.2 F (37.3 C) 98.8 F (37.1 C) 99.3 F (37.4 C)  TempSrc: Axillary Oral Oral Oral  SpO2: 99% 99% 98% 100%  Weight:   47.4 kg   Height:       Neurology mild somnolence but easy to arouse, follows commands and responds to questions  ENT with mild pallor, cortrak in place Cardiovascular with S1  and S2 present and regular with no gallops, rubs or murmurs Respiratory with no rales or wheezing, no rhonchi, poor inspiratory effort Abdomen with no distention, soft and non tender, nephrostomy tube in place No lower extremity edema  Data Reviewed:    Family Communication: I spoke with Rhonda [name] [name]'s husband at [name] bedside, we  talked in detail about Rhonda [name] [name]'s condition, plan of care and prognosis and all questions were addressed.   Disposition: Status is: Inpatient Remains inpatient appropriate because: recovering   Planned Discharge Destination: CIR      Author: Elidia Toribio Furnace, MD 12/31/2023 2:45 PM  For on call review [email or web address].

## 2023-12-31 NOTE — Plan of Care (Signed)
  Problem: Education: Goal: Knowledge of General Education information will improve Description: Including pain rating scale, medication(s)/side effects and non-pharmacologic comfort measures Outcome: Progressing   Problem: Health Behavior/Discharge Planning: Goal: Ability to manage health-related needs will improve Outcome: Progressing   Problem: Clinical Measurements: Goal: Ability to maintain clinical measurements within normal limits will improve Outcome: Progressing Goal: Will remain free from infection Outcome: Progressing Goal: Diagnostic test results will improve Outcome: Progressing Goal: Respiratory complications will improve Outcome: Progressing Goal: Cardiovascular complication will be avoided Outcome: Progressing   Problem: Activity: Goal: Risk for activity intolerance will decrease Outcome: Progressing   Problem: Nutrition: Goal: Adequate nutrition will be maintained Outcome: Progressing   Problem: Coping: Goal: Level of anxiety will decrease Outcome: Progressing   Problem: Elimination: Goal: Will not experience complications related to bowel motility Outcome: Progressing Goal: Will not experience complications related to urinary retention Outcome: Progressing   Problem: Pain Managment: Goal: General experience of comfort will improve and/or be controlled Outcome: Progressing   Problem: Safety: Goal: Ability to remain free from injury will improve Outcome: Progressing   Problem: Skin Integrity: Goal: Risk for impaired skin integrity will decrease Outcome: Progressing   Problem: Education: Goal: Ability to describe self-care measures that may prevent or decrease complications (Diabetes Survival Skills Education) will improve Outcome: Progressing Goal: Individualized Educational Video(s) Outcome: Progressing   Problem: Coping: Goal: Ability to adjust to condition or change in health will improve Outcome: Progressing   Problem: Fluid  Volume: Goal: Ability to maintain a balanced intake and output will improve Outcome: Progressing   Problem: Health Behavior/Discharge Planning: Goal: Ability to identify and utilize available resources and services will improve Outcome: Progressing Goal: Ability to manage health-related needs will improve Outcome: Progressing   Problem: Metabolic: Goal: Ability to maintain appropriate glucose levels will improve Outcome: Progressing   Problem: Nutritional: Goal: Maintenance of adequate nutrition will improve Outcome: Progressing Goal: Progress toward achieving an optimal weight will improve Outcome: Progressing   Problem: Skin Integrity: Goal: Risk for impaired skin integrity will decrease Outcome: Progressing   Problem: Tissue Perfusion: Goal: Adequacy of tissue perfusion will improve Outcome: Progressing   Problem: Education: Goal: Expressions of having a comfortable level of knowledge regarding the disease process will increase Outcome: Progressing   Problem: Coping: Goal: Ability to adjust to condition or change in health will improve Outcome: Progressing Goal: Ability to identify appropriate support needs will improve Outcome: Progressing   Problem: Health Behavior/Discharge Planning: Goal: Compliance with prescribed medication regimen will improve Outcome: Progressing   Problem: Medication: Goal: Risk for medication side effects will decrease Outcome: Progressing   Problem: Clinical Measurements: Goal: Complications related to the disease process, condition or treatment will be avoided or minimized Outcome: Progressing Goal: Diagnostic test results will improve Outcome: Progressing   Problem: Safety: Goal: Verbalization of understanding the information provided will improve Outcome: Progressing   Problem: Self-Concept: Goal: Level of anxiety will decrease Outcome: Progressing Goal: Ability to verbalize feelings about condition will improve Outcome:  Progressing

## 2023-12-31 NOTE — Progress Notes (Signed)
 Inpatient Rehab Admissions Coordinator:   Pt has been lethargic since I met with her yesterday.  I did receive insurance approval for CIR but I do not have a bed today or likely over the weekend.  Will continue to follow for admit pending bed availability and improvement in lethargy.   Reche Lowers, PT, DPT Admissions Coordinator [phone] 12/31/23 10:07 AM

## 2023-12-31 NOTE — PMR Pre-admission (Shared)
 PMR Admission Coordinator Pre-Admission Assessment  Patient: Rhonda [name] is an 68 y.o., female MRN: [id number] DOB: February 03, 1956 Height: 5' 3 (160 cm) Weight: 47.4 kg  Insurance Information HMO:     PPO: yes     PCP:      IPA:      80/20:      OTHER:  PRIMARY: Blue Medicare      Policy#: [id number]      Subscriber: pt CM Name: Clotilda      Phone#: [phone]     Fax#: [phone] Pre-Cert#: [id number] auth for CIR from Clotilda with [facility] Medicare for admit *** with next review date ***.  Updates due to [facility] at fax listed above.        Employer:  Benefits:  Phone #: [phone]     Name:  Eff. Date: 02/17/23     Deduct: $0      Out of Pocket Max: $5900 (met $3115.54)      Life Max: n/a CIR: $335/day for days 1-5      SNF: 20 full days  Outpatient:      Co-Pay: $10/day Home Health: 100%      Co-Pay:  DME: 80%     Co-Pay: 20% Providers:  SECONDARY:       Policy#:      Phone#:   Artist:       Phone#:   [employer] Information Summary" for patients in Inpatient Rehabilitation Facilities with attached "Privacy Act Statement-Health Care Records" was provided and verbally reviewed with: Patient and Family  Emergency Contact Information Contact Information     Name Relation Home Work Mobile   Nott,John Spouse   [phone]      Other Contacts   None on File     Current Medical History  Patient Admitting Diagnosis: bacteremia, encephalopathy  History of Present Illness: Pt is a 68 y/o female with PMH of DM, bladder cancer with nephrostomy tubes, s/p kidney transplant, recent UTI with septic shock and hospital stay (August 2025) who was admitted to [facility] 12/22/23 with decrease level of consciousness with progressive nausea/vomiting/diarrhea.  In ED pt was hypotensive (75/54), tachycardic, febrile (102.3 rectal), RR 27 and she was admitted for sepsis criteria to the ICU.  Labs notable for creatinine 4.28, albumin 2.7, WBC 16, hgb 11.4.  UA  showed UTI in the setting of bladder cancer with nephrostomy tube.  She did require vasopressors in the ICU for BP support.  Urine cultures showed pseudomonas bacteremia she was started on zosyn which should be completed on 11/14.  On 11/9 neurology was consulted for ongoing encephalopathy.  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  MRI showed incidental subacute ischemic stroke.  She continues to have waxing/waning lethargy.  Therapy ongoing and pt was recommended for CIR.     Patient's medical record from [facility] has been reviewed by the rehabilitation admission coordinator and physician.  Past Medical History  Past Medical History:  Diagnosis Date   CKD (chronic kidney disease), stage III (HCC)    DM (diabetes mellitus), type 2 (HCC)    H/O transurethral resection of bladder tumor (TURBT)    Hyperlipidemia    Hypertension    Kidney transplant recipient    vesicoureteral reflux   Microcytic anemia    Osteoporosis    Urothelial cancer (HCC)     Has the patient had major surgery during 100 days prior to admission? Yes  Family  History   family history includes Breast cancer in her sister; Diabetes in her father; Heart disease in her mother and sister; Ovarian cancer in her sister; Stroke in her father.  Current Medications  Current Facility-Administered Medications:    aspirin chewable tablet 81 mg, 81 mg, Oral, Daily, Dewald, Jonathan B, MD, 81 mg at 12/30/23 0907   Chlorhexidine Gluconate Cloth 2 % PADS 6 each, 6 each, Topical, Daily, Ilah Krabbe M, PA-C, 6 each at 12/30/23 0908   docusate sodium (COLACE) capsule 100 mg, 100 mg, Oral, BID PRN, Ilah Krabbe M, PA-C   feeding supplement (KATE FARMS STANDARD ENT 1.4) liquid 325 mL, 325 mL, Oral, BID BM, Arrien, Mauricio Daniel, MD, 325 mL at 12/30/23 1712   feeding supplement (OSMOLITE 1.2 CAL) liquid 600 mL, 600 mL, Per Tube, Q24H, Arrien,  Mauricio Daniel, MD, Infusion Verify at 12/31/23 0625   heparin injection 5,000 Units, 5,000 Units, Subcutaneous, Q8H, Ilah Krabbe M, PA-C, 5,000 Units at 12/31/23 9365   insulin aspart (novoLOG) injection 0-15 Units, 0-15 Units, Subcutaneous, Q4H, Simpson, Vina B, NP, 3 Units at 12/31/23 0813   lacosamide (VIMPAT) 50 mg in sodium chloride  0.9 % 25 mL IVPB, 50 mg, Intravenous, Q12H, Yadav, Priyanka O, MD, Stopped at 12/30/23 2205   levETIRAcetam (KEPPRA) undiluted injection 500 mg, 500 mg, Intravenous, Q12H, Shelton, Priyanka O, MD, 500 mg at 12/31/23 0756   multivitamin with minerals tablet 1 tablet, 1 tablet, Oral, Daily, Hussein, Lenny, MD, 1 tablet at 12/30/23 0907   ondansetron  (ZOFRAN ) injection 4 mg, 4 mg, Intravenous, Q6H PRN, Ilah Krabbe HERO, PA-C   Oral care mouth rinse, 15 mL, Mouth Rinse, PRN, Layman Raisin, DO   Oral care mouth rinse, 15 mL, Mouth Rinse, 4 times per day, Pawar, Rahul, MD, 15 mL at 12/30/23 2132   Oral care mouth rinse, 15 mL, Mouth Rinse, PRN, Pawar, Rahul, MD   piperacillin-tazobactam (ZOSYN) IVPB 3.375 g, 3.375 g, Intravenous, Q8H, Simpson, Paula B, NP, Last Rate: 12.5 mL/hr at 12/31/23 0747, 3.375 g at 12/31/23 0747   polyethylene glycol (MIRALAX / GLYCOLAX) packet 17 g, 17 g, Oral, Daily PRN, Ilah, Stephanie M, PA-C   pravastatin (PRAVACHOL) tablet 20 mg, 20 mg, Oral, q1800, Kara Carrier B, MD, 20 mg at 12/30/23 1713   predniSONE (DELTASONE) tablet 10 mg, 10 mg, Oral, Q breakfast, Dewald, Jonathan B, MD, 10 mg at 12/31/23 0756   tacrolimus (PROGRAF) 1 mg/mL oral suspension 2 mg, 2 mg, Per Tube, QHS, Antonetta Vina B, NP, 2 mg at 12/30/23 2130   tacrolimus (PROGRAF) 1 mg/mL oral suspension 3 mg, 3 mg, Per Tube, Daily, Antonetta Vina B, NP, 3 mg at 12/30/23 0910   thiamine (VITAMIN B1) tablet 100 mg, 100 mg, Oral, Daily, Dewald, Jonathan B, MD, 100 mg at 12/30/23 0907  Patients Current Diet:  Diet Order             Diet regular Room service  appropriate? Yes with Assist; Fluid consistency: Thin  Diet effective now                   Precautions / Restrictions Precautions Precautions: Fall, Other (comment) Precaution/Restrictions Comments: cortrak, nephrostomy tube, incontinent B/B Restrictions Weight Bearing Restrictions Per Provider Order: No   Has the patient had 2 or more falls or a fall with injury in the past year? Yes  Prior Activity Level Community (5-7x/wk): prior to initial hospitalization earlier this fall, she was independent without DME for mobility and ADLs  Prior Functional  Level Self Care: Did the patient need help bathing, dressing, using the toilet or eating? Independent  Indoor Mobility: Did the patient need assistance with walking from room to room (with or without device)? Independent  Stairs: Did the patient need assistance with internal or external stairs (with or without device)? Independent  Functional Cognition: Did the patient need help planning regular tasks such as shopping or remembering to take medications? Independent  Patient Information Are you of Hispanic, Latino/a,or Spanish origin?: A. No, not of Hispanic, Latino/a, or Spanish origin What is your race?: A. White Do you need or want an interpreter to communicate with a doctor or health care staff?: 0. No  Patient's Response To:  Health Literacy and Transportation Is the patient able to respond to health literacy and transportation needs?: Yes Health Literacy - How often do you need to have someone help you when you read instructions, pamphlets, or other written material from your doctor or pharmacy?: Never In the past 12 months, has lack of transportation kept you from medical appointments or from getting medications?: No In the past 12 months, has lack of transportation kept you from meetings, work, or from getting things needed for daily living?: No  Home Assistive Devices / Equipment Home Equipment: None  Prior Device Use:  Indicate devices/aids used by the patient prior to current illness, exacerbation or injury? None of the above  Current Functional Level Cognition  Orientation Level: Oriented X4    Extremity Assessment (includes Sensation/Coordination)  Upper Extremity Assessment: Generalized weakness  Lower Extremity Assessment: Defer to PT evaluation    ADLs  Overall ADL's : Needs assistance/impaired Eating/Feeding: Set up, Bed level Eating/Feeding Details (indicate cue type and reason): Cortrak intact for supplemental feeds Grooming: Bed level, Moderate assistance, Wash/dry face, Brushing hair Upper Body Dressing : Moderate assistance Lower Body Dressing: Total assistance Toileting- Clothing Manipulation and Hygiene: Total assistance Toileting - Clothing Manipulation Details (indicate cue type and reason): neph tube & purewick intact; pt incontinent of bowel (without knowledge of this happening) and required total A for peri hygiene Functional mobility during ADLs: Moderate assistance, +2 for physical assistance, Cueing for safety General ADL Comments: Limited d/t fatigue    Mobility  Overal bed mobility: Needs Assistance Bed Mobility: Supine to Sit Rolling: Max assist, Used rails Sidelying to sit: Max assist, HOB elevated, Used rails Supine to sit: Min assist, Used rails, HOB elevated Sit to sidelying: Max assist, HOB elevated, Used rails General bed mobility comments: Pt declined    Transfers  Overall transfer level: Needs assistance Equipment used: 2 person hand held assist Transfers: Sit to/from Stand Sit to Stand: Mod assist, Min assist Bed to/from chair/wheelchair/BSC transfer type:: Stand pivot Stand pivot transfers: Mod assist Step pivot transfers: Mod assist, +2 physical assistance, +2 safety/equipment General transfer comment: Pt declined    Ambulation / Gait / Stairs / Wheelchair Mobility  Ambulation/Gait Ambulation/Gait assistance: Min assist, +2 safety/equipment Gait  Distance (Feet): 6 Feet Assistive device: Rolling walker (2 wheels) Gait Pattern/deviations: Step-through pattern, Decreased stride length, Trunk flexed General Gait Details: cues for safety, RW proximity and posture. close chair follow for safety, pt limited by fatigue Gait velocity interpretation: <1.8 ft/sec, indicate of risk for recurrent falls    Posture / Balance Dynamic Sitting Balance Sitting balance - Comments: CGA at EOB Balance Overall balance assessment: Needs assistance Sitting-balance support: Feet supported, No upper extremity supported Sitting balance-Leahy Scale: Fair Sitting balance - Comments: CGA at EOB Postural control:  (All directions) Standing balance support:  Bilateral upper extremity supported, During functional activity Standing balance-Leahy Scale: Poor Standing balance comment: RW in standing and physical assist    Special considerations/life events  Skin nephrostomy tube 09/2023 and Diabetic management yes   Previous Home Environment (from acute therapy documentation) Living Arrangements: Spouse/significant other Available Help at Discharge: Family, Available 24 hours/day Type of Home: Apartment Home Layout: One level Home Access: Stairs to enter Entrance Stairs-Rails: Right Entrance Stairs-Number of Steps: FF Bathroom Shower/Tub: Associate Professor: No Home Care Services: No Additional Comments: lives on second floor of apartment  Discharge Living Setting Plans for Discharge Living Setting: Patient's home, Lives with (comment) (spouse) Type of Home at Discharge: Apartment Discharge Home Layout: One level Discharge Home Access: Stairs to enter Entrance Stairs-Rails: Right Entrance Stairs-Number of Steps: 13-15 Discharge Bathroom Shower/Tub: Tub/shower unit Discharge Bathroom Toilet: Standard Discharge Bathroom Accessibility: Yes How Accessible: Accessible via walker Does the patient have any  problems obtaining your medications?: No  Social/Family/Support Systems Patient Roles: Spouse Anticipated Caregiver: Norleen (spouse) Anticipated Caregiver's Contact Information: [phone] Ability/Limitations of Caregiver: none stated Caregiver Availability: 24/7 Discharge Plan Discussed with Primary Caregiver: Yes Is Caregiver In Agreement with Plan?: Yes Does Caregiver/Family have Issues with Lodging/Transportation while Pt is in Rehab?: No  Goals Patient/Family Goal for Rehab: PT/OT supervision to min assist, SLP supervision Expected length of stay: 12-14 days Additional Information: discharge plan: home with spouse 24/7 to second floor apartment Pt/Family Agrees to Admission and willing to participate: Yes Program Orientation Provided & Reviewed with Pt/Caregiver Including Roles  & Responsibilities: Yes  Decrease burden of Care through IP rehab admission: n/a  Possible need for SNF placement upon discharge: Not anticipated.  Plan for home with spouse who can provide 24/7 supervision.   Patient Condition: This patient's medical and functional status has changed since the consult dated: 11/12 in which the Rehabilitation Physician determined and documented that the patient's condition is appropriate for intensive rehabilitative care in an inpatient rehabilitation facility. See History of Present Illness (above) for medical update. Functional changes are: ***. Patient's medical and functional status update has been discussed with the Rehabilitation physician and patient remains appropriate for inpatient rehabilitation. Will admit to inpatient rehab today.  Preadmission Screen Completed By:  Reche FORBES Lowers, PT, DPT, 12/31/2023 10:12 AM ______________________________________________________________________   Discussed status with Dr. PIERRETTE on *** at *** and received approval for admission today.  Admission Coordinator:  Eliyohu Class E Sherrine Salberg, PT, DPT, time PIERRETTEPattricia ***    Assessment/Plan: Diagnosis: *** Does the need for close, 24 hr/day Medical supervision in concert with the patient's rehab needs make it unreasonable for this patient to be served in a less intensive setting? {yes_no_potentially:3041433} Co-Morbidities requiring supervision/potential complications: *** Due to {due un:6958565}, does the patient require 24 hr/day rehab nursing? {yes_no_potentially:3041433} Does the patient require coordinated care of a physician, rehab nurse, PT, OT, and SLP to address physical and functional deficits in the context of the above medical diagnosis(es)? {yes_no_potentially:3041433} Addressing deficits in the following areas: {deficits:3041436} Can the patient actively participate in an intensive therapy program of at least 3 hrs of therapy 5 days a week? {yes_no_potentially:3041433} The potential for patient to make measurable gains while on inpatient rehab is {potential:3041437} Anticipated functional outcomes upon discharge from inpatient rehab: {functional outcomes:304600100} PT, {functional outcomes:304600100} OT, {functional outcomes:304600100} SLP Estimated rehab length of stay to reach the above functional goals is: *** Anticipated discharge destination: {anticipated dc setting:21604} 10. Overall Rehab/Functional Prognosis: {potential:3041437}   MD Signature: ***

## 2023-12-31 NOTE — Progress Notes (Signed)
 PT Cancellation Note  Patient Details Name: Rhonda [name] MRN: [id number] DOB: May 18, 1955   Cancelled Treatment:    Reason Eval/Treat Not Completed: Patient declined, no reason specified (Pt declined any mobility, states fatigue and pain. Educated for benefit of therapy but continued to refuse)   [name] [name] 12/31/2023, 9:05 AM [name], PT Acute Rehabilitation Services Office: [phone]

## 2024-01-01 DIAGNOSIS — E43 Unspecified severe protein-calorie malnutrition: Secondary | ICD-10-CM | POA: Diagnosis not present

## 2024-01-01 DIAGNOSIS — N390 Urinary tract infection, site not specified: Secondary | ICD-10-CM | POA: Diagnosis not present

## 2024-01-01 DIAGNOSIS — A419 Sepsis, unspecified organism: Secondary | ICD-10-CM | POA: Diagnosis not present

## 2024-01-01 DIAGNOSIS — E785 Hyperlipidemia, unspecified: Secondary | ICD-10-CM | POA: Diagnosis not present

## 2024-01-01 DIAGNOSIS — R6521 Severe sepsis with septic shock: Secondary | ICD-10-CM

## 2024-01-01 LAB — CULTURE, BLOOD (ROUTINE X 2)
Culture: NO GROWTH
Culture: NO GROWTH
Special Requests: ADEQUATE

## 2024-01-01 LAB — GLUCOSE, CAPILLARY
Glucose-Capillary: 112 mg/dL — ABNORMAL HIGH (ref 70–99)
Glucose-Capillary: 162 mg/dL — ABNORMAL HIGH (ref 70–99)
Glucose-Capillary: 181 mg/dL — ABNORMAL HIGH (ref 70–99)
Glucose-Capillary: 193 mg/dL — ABNORMAL HIGH (ref 70–99)
Glucose-Capillary: 69 mg/dL — ABNORMAL LOW (ref 70–99)
Glucose-Capillary: 83 mg/dL (ref 70–99)

## 2024-01-01 LAB — BASIC METABOLIC PANEL WITH GFR
Anion gap: 13 (ref 5–15)
BUN: 60 mg/dL — ABNORMAL HIGH (ref 8–23)
CO2: 22 mmol/L (ref 22–32)
Calcium: 8.3 mg/dL — ABNORMAL LOW (ref 8.9–10.3)
Chloride: 102 mmol/L (ref 98–111)
Creatinine, Ser: 2.06 mg/dL — ABNORMAL HIGH (ref 0.44–1.00)
GFR, Estimated: 26 mL/min — ABNORMAL LOW (ref >60)
Glucose, Bld: 88 mg/dL (ref 70–99)
Potassium: 4.3 mmol/L (ref 3.5–5.1)
Sodium: 137 mmol/L (ref 135–145)

## 2024-01-01 LAB — ADAMTS13 ANTIBODY: ADAMTS13 Antibody: <2 U/mL (ref <12)

## 2024-01-01 LAB — ADAMTS13 ACTIVITY: Adamts 13 Activity: 25.3 % — CL (ref >66.8)

## 2024-01-01 MED ORDER — ACETAMINOPHEN 325 MG PO TABS
650.0000 mg | ORAL_TABLET | Freq: Four times a day (QID) | ORAL | Status: DC | PRN
  Administered 2024-01-01 – 2024-01-04 (×7): 650 mg via ORAL
  Filled 2024-01-01 (×8): qty 2

## 2024-01-01 MED ORDER — LEVETIRACETAM (KEPPRA) 500 MG/5 ML ADULT IV PUSH
500.0000 mg | INTRAVENOUS | Status: DC
  Administered 2024-01-02: 500 mg via INTRAVENOUS
  Filled 2024-01-01 (×2): qty 5

## 2024-01-01 MED ORDER — OXYBUTYNIN CHLORIDE 5 MG PO TABS: 5.0000 mg | ORAL_TABLET | Freq: Three times a day (TID) | ORAL | Status: DC | PRN

## 2024-01-01 NOTE — Plan of Care (Signed)
  Problem: Education: Goal: Knowledge of General Education information will improve Description: Including pain rating scale, medication(s)/side effects and non-pharmacologic comfort measures Outcome: Progressing   Problem: Health Behavior/Discharge Planning: Goal: Ability to manage health-related needs will improve Outcome: Progressing   Problem: Clinical Measurements: Goal: Ability to maintain clinical measurements within normal limits will improve Outcome: Progressing Goal: Will remain free from infection Outcome: Progressing Goal: Diagnostic test results will improve Outcome: Progressing Goal: Respiratory complications will improve Outcome: Progressing Goal: Cardiovascular complication will be avoided Outcome: Progressing   Problem: Activity: Goal: Risk for activity intolerance will decrease Outcome: Progressing   Problem: Nutrition: Goal: Adequate nutrition will be maintained Outcome: Progressing   Problem: Coping: Goal: Level of anxiety will decrease Outcome: Progressing   Problem: Elimination: Goal: Will not experience complications related to bowel motility Outcome: Progressing Goal: Will not experience complications related to urinary retention Outcome: Progressing   Problem: Pain Managment: Goal: General experience of comfort will improve and/or be controlled Outcome: Progressing   Problem: Safety: Goal: Ability to remain free from injury will improve Outcome: Progressing   Problem: Skin Integrity: Goal: Risk for impaired skin integrity will decrease Outcome: Progressing   Problem: Education: Goal: Ability to describe self-care measures that may prevent or decrease complications (Diabetes Survival Skills Education) will improve Outcome: Progressing Goal: Individualized Educational Video(s) Outcome: Progressing   Problem: Coping: Goal: Ability to adjust to condition or change in health will improve Outcome: Progressing   Problem: Fluid  Volume: Goal: Ability to maintain a balanced intake and output will improve Outcome: Progressing   Problem: Health Behavior/Discharge Planning: Goal: Ability to identify and utilize available resources and services will improve Outcome: Progressing Goal: Ability to manage health-related needs will improve Outcome: Progressing   Problem: Metabolic: Goal: Ability to maintain appropriate glucose levels will improve Outcome: Progressing   Problem: Nutritional: Goal: Maintenance of adequate nutrition will improve Outcome: Progressing Goal: Progress toward achieving an optimal weight will improve Outcome: Progressing   Problem: Skin Integrity: Goal: Risk for impaired skin integrity will decrease Outcome: Progressing   Problem: Tissue Perfusion: Goal: Adequacy of tissue perfusion will improve Outcome: Progressing   Problem: Education: Goal: Expressions of having a comfortable level of knowledge regarding the disease process will increase Outcome: Progressing   Problem: Coping: Goal: Ability to adjust to condition or change in health will improve Outcome: Progressing Goal: Ability to identify appropriate support needs will improve Outcome: Progressing   Problem: Health Behavior/Discharge Planning: Goal: Compliance with prescribed medication regimen will improve Outcome: Progressing   Problem: Medication: Goal: Risk for medication side effects will decrease Outcome: Progressing   Problem: Clinical Measurements: Goal: Complications related to the disease process, condition or treatment will be avoided or minimized Outcome: Progressing Goal: Diagnostic test results will improve Outcome: Progressing   Problem: Safety: Goal: Verbalization of understanding the information provided will improve Outcome: Progressing   Problem: Self-Concept: Goal: Level of anxiety will decrease Outcome: Progressing Goal: Ability to verbalize feelings about condition will improve Outcome:  Progressing

## 2024-01-01 NOTE — Progress Notes (Signed)
 Progress Note   Patient: Rhonda [name] FMW:[id number] DOB: 1955-09-26 DOA: 12/22/2023     10 DOS: the patient was seen and examined on 01/01/2024   Brief hospital course: Mrs. Pitter was admitted to the hospital with sepsis due to urinary tract infection.    68 yo female with the past medical history of bladder cancer, sp nephrostomy tube placement 11/04, hyperlipidemia, CKD and hypertension who presented with altered mental status. She was noted to have a rapid change in her mentation. Initially feeling very weak, with nausea, vomiting and diarrhea. EMS was called and she was found hypotensive 82/58 with HR 130 bpm, she had one liter IV fluids and transported to the ED.  On her initial physical examination in the ED her blood pressure was 75/54, HR 108, RR 27, 02 saturation 97% and temperature 102.3 F  Lungs with no wheezing or rales, heart with S1 and S2 present and tachycardic, abdomen with no distention, soft and non tender, right nephrostomy tube in place, no lower extremity edema.    11/5 Admitted to icu  11/6 Admitted overnight, Pressor requirements have significant improved. Remains quite encephalopathic, agitated.  11/7 drastic increase in supplemental oxygen demand overnight now on 60 FiO2 with 45 L nasal cannula.  Remains confused and agitated 11/9 Pseudomonas bacteremia, cefepime changed to zosyn, cEEG c/w subclinical seizures, stopped after ativan and keppra load, neurology consulted 11/10 ongoing cEEG, vimpat added, MRI    11/13 transferred to [facility]. Positive diarrhea, possible due to tube feedings. Patient allowed to eat by mouth.  11/14 requested to remove cortrak, tolerating PO's 11/15 following commands appropriately, demonstrating some hematuria. Weak and deconditioned.  Assessment and Plan: * Complicated urinary tract infection -Septic shock present on admission has resolved.  -Pseudomonas bacteremia.  -Patient has completed antibiotic therapy ( Zosyn).   -maintain adequate hydration -continue outpatient follow up with urology service.  Acute hypoxemic respiratory failure  -is resolved..  -02 saturation today is 98% on room air.  -Patient very weak and deconditioned, needs further rehab.  - Anticipated CIR rehabilitation starting 01/02/2024 if patient otherwise stable  AKI (acute kidney injury) -Sp renal transplant.  -Hypomagnesemia, hypoPhosphatemia, hypernatremia.  -Sp right nephrostomy tube.  -Continue gentle fluid resuscitation as needed and encourage patient to adequate oral hydration.. -Continue immunosuppressive therapy with Tacrolimus and prednisone.  - Continue minimizing nephrotoxic agents and follow renal function trend.  Seizure (HCC) -Acute metabolic encephalopathy (sepsis and cefepime neurotoxicity), improving.  -Subacute CVA.  -Patient with significant improvement in her somnolence after discontinuing Vimpat. - Following neurology recommendations will initiate weaning off Keppra at home. -Continue aspirin and statin  -Continue seizure precautions.  -Coreg has been discontinued - Continue adequate oral intake.  Dyslipidemia -Continue statin.  Thrombocytopenia -Cell count overall stable  -DIC panel negative - Given ongoing hematuria process heparin protocol has been discontinued.  Bladder cancer [facility]) -Outpatient follow-up with primary urologist (Dr. Evern) [facility]. - Heparin products have been placed on hold and SCDs used for DVT prophylaxis - Continue to follow hemoglobin trend.  Follow up as outpatient   Protein-calorie malnutrition, severe Nutritional supplementation  -Body mass index is 17.61 kg/m.  - Discussed with patient the importance of good oral intake and hydration.    Subjective: No fever, no chest pain, no nausea, no vomiting.  Reports some decreased appetite.  Complaining of suprapubic pain and is expressing active hematuria.  Physical Exam: Vitals:   12/31/23 2327 01/01/24 0334  01/01/24 0750 01/01/24 1228  BP: (!) 141/81 (!) 148/82 135/79  125/77  Pulse: 95 98 (!) 105 (!) 117  Resp: 18 19 19 17   Temp: 97.8 F (36.6 C) 98 F (36.7 C) 98.7 F (37.1 C)   TempSrc: Oral Oral Oral   SpO2: 98% 99%  94%  Weight:  45.1 kg    Height:       General exam: Alert, awake, oriented x 3; following commands appropriately and reporting some suprapubic discomfort.  No fever Respiratory system: Good saturation on room air; using accessory muscles. Cardiovascular system: Rate, controlled, no rubs, no gallops, no JVD on exam. Gastrointestinal system: Abdomen is nondistended, soft and nontender. No organomegaly or masses felt. Normal bowel sounds heard. Central nervous system: Alert and oriented. No focal neurological deficits. Extremities: No C/C/E, +pedal pulses Skin: No petechiae. Psychiatry: Judgement and insight appear normal.  Flat affect.  Data Reviewed: Basic metabolic panel: Sodium 137, potassium 4.3, chloride 102, bicarb 22, BUN 60, creatinine 2.06 and GFR 26.   Family Communication: I spoke with patient's husband and patient's daughter at the bedside, we talked in detail about patient's condition, plan of care and prognosis and all questions were addressed.   Disposition: Status is: Inpatient Remains inpatient appropriate because: IV therapy and pending transfer to CIR.  Planned Discharge Destination: CIR   Author: Eric Nunnery, MD 01/01/2024 2:50 PM  For on call review [email or web address].

## 2024-01-01 NOTE — TOC Progression Note (Signed)
 Transition of Care [facility]) - Progression Note    Patient Details  Name: Rhonda [name] MRN: [id number] Date of Birth: [birth year]  Transition of Care [facility]) CM/SW Contact  Luise JAYSON Pan, [place] Phone Number: 01/01/2024, 8:14 AM  Clinical Narrative:   TOC will continue to follow.                       Expected Discharge Plan and Services                                               Social Drivers of Health (SDOH) Interventions SDOH Screenings   Food Insecurity: No Food Insecurity (12/24/2023)  Housing: Low Risk  (12/24/2023)  Transportation Needs: No Transportation Needs (12/24/2023)  Utilities: Not At Risk (12/24/2023)  Alcohol Screen: Low Risk  (11/29/2023)  Depression (PHQ2-9): Low Risk  (12/21/2023)  Financial Resource Strain: Low Risk  (11/29/2023)  Physical Activity: Sufficiently Active (11/29/2023)  Social Connections: Moderately Integrated (12/24/2023)  Stress: Stress Concern Present (11/29/2023)  Tobacco Use: Medium Risk (12/24/2023)    Readmission Risk Interventions     No data to display

## 2024-01-02 DIAGNOSIS — E785 Hyperlipidemia, unspecified: Secondary | ICD-10-CM | POA: Diagnosis not present

## 2024-01-02 DIAGNOSIS — A419 Sepsis, unspecified organism: Secondary | ICD-10-CM | POA: Diagnosis not present

## 2024-01-02 DIAGNOSIS — E43 Unspecified severe protein-calorie malnutrition: Secondary | ICD-10-CM | POA: Diagnosis not present

## 2024-01-02 DIAGNOSIS — N390 Urinary tract infection, site not specified: Secondary | ICD-10-CM | POA: Diagnosis not present

## 2024-01-02 LAB — GLUCOSE, CAPILLARY
Glucose-Capillary: 127 mg/dL — ABNORMAL HIGH (ref 70–99)
Glucose-Capillary: 105 mg/dL — ABNORMAL HIGH (ref 70–99)
Glucose-Capillary: 105 mg/dL — ABNORMAL HIGH (ref 70–99)
Glucose-Capillary: 171 mg/dL — ABNORMAL HIGH (ref 70–99)
Glucose-Capillary: 219 mg/dL — ABNORMAL HIGH (ref 70–99)
Glucose-Capillary: 126 mg/dL — ABNORMAL HIGH (ref 70–99)

## 2024-01-02 LAB — CBC
HCT: 26.3 % — ABNORMAL LOW (ref 36.0–46.0)
Hemoglobin: 8.5 g/dL — ABNORMAL LOW (ref 12.0–15.0)
MCH: 31.3 pg (ref 26.0–34.0)
MCHC: 32.3 g/dL (ref 30.0–36.0)
MCV: 96.7 fL (ref 80.0–100.0)
Platelets: 225 K/uL (ref 150–400)
RBC: 2.72 MIL/uL — ABNORMAL LOW (ref 3.87–5.11)
RDW: 14.5 % (ref 11.5–15.5)
WBC: 10.5 K/uL (ref 4.0–10.5)
nRBC: 0 % (ref 0.0–0.2)

## 2024-01-02 MED ORDER — TACROLIMUS 1 MG PO CAPS
2.0000 mg | ORAL_CAPSULE | Freq: Every day | ORAL | Status: DC
  Administered 2024-01-02 – 2024-01-03 (×2): 2 mg via ORAL
  Filled 2024-01-02 (×3): qty 2

## 2024-01-02 MED ORDER — VITAMIN B-12 1000 MCG PO TABS
1000.0000 ug | ORAL_TABLET | ORAL | Status: DC
  Administered 2024-01-02 – 2024-01-04 (×2): 1000 ug via ORAL
  Filled 2024-01-02 (×2): qty 1

## 2024-01-02 MED ORDER — PANTOPRAZOLE SODIUM 40 MG PO TBEC
40.0000 mg | DELAYED_RELEASE_TABLET | Freq: Every day | ORAL | Status: DC
  Administered 2024-01-02 – 2024-01-04 (×3): 40 mg via ORAL
  Filled 2024-01-02 (×3): qty 1

## 2024-01-02 MED ORDER — TACROLIMUS 1 MG PO CAPS
3.0000 mg | ORAL_CAPSULE | Freq: Every day | ORAL | Status: DC
  Administered 2024-01-02 – 2024-01-04 (×3): 3 mg via ORAL
  Filled 2024-01-02 (×3): qty 3

## 2024-01-02 NOTE — Plan of Care (Signed)
  Problem: Education: Goal: Knowledge of General Education information will improve Description: Including pain rating scale, medication(s)/side effects and non-pharmacologic comfort measures Outcome: Progressing   Problem: Health Behavior/Discharge Planning: Goal: Ability to manage health-related needs will improve Outcome: Progressing   Problem: Clinical Measurements: Goal: Ability to maintain clinical measurements within normal limits will improve Outcome: Progressing Goal: Will remain free from infection Outcome: Progressing Goal: Diagnostic test results will improve Outcome: Progressing Goal: Respiratory complications will improve Outcome: Progressing Goal: Cardiovascular complication will be avoided Outcome: Progressing   Problem: Activity: Goal: Risk for activity intolerance will decrease Outcome: Progressing   Problem: Nutrition: Goal: Adequate nutrition will be maintained Outcome: Progressing   Problem: Coping: Goal: Level of anxiety will decrease Outcome: Progressing   Problem: Elimination: Goal: Will not experience complications related to bowel motility Outcome: Progressing Goal: Will not experience complications related to urinary retention Outcome: Progressing   Problem: Pain Managment: Goal: General experience of comfort will improve and/or be controlled Outcome: Progressing   Problem: Safety: Goal: Ability to remain free from injury will improve Outcome: Progressing   Problem: Skin Integrity: Goal: Risk for impaired skin integrity will decrease Outcome: Progressing   Problem: Education: Goal: Ability to describe self-care measures that may prevent or decrease complications (Diabetes Survival Skills Education) will improve Outcome: Progressing Goal: Individualized Educational Video(s) Outcome: Progressing   Problem: Coping: Goal: Ability to adjust to condition or change in health will improve Outcome: Progressing   Problem: Fluid  Volume: Goal: Ability to maintain a balanced intake and output will improve Outcome: Progressing   Problem: Health Behavior/Discharge Planning: Goal: Ability to identify and utilize available resources and services will improve Outcome: Progressing Goal: Ability to manage health-related needs will improve Outcome: Progressing   Problem: Metabolic: Goal: Ability to maintain appropriate glucose levels will improve Outcome: Progressing   Problem: Nutritional: Goal: Maintenance of adequate nutrition will improve Outcome: Progressing Goal: Progress toward achieving an optimal weight will improve Outcome: Progressing   Problem: Skin Integrity: Goal: Risk for impaired skin integrity will decrease Outcome: Progressing   Problem: Tissue Perfusion: Goal: Adequacy of tissue perfusion will improve Outcome: Progressing   Problem: Education: Goal: Expressions of having a comfortable level of knowledge regarding the disease process will increase Outcome: Progressing   Problem: Coping: Goal: Ability to adjust to condition or change in health will improve Outcome: Progressing Goal: Ability to identify appropriate support needs will improve Outcome: Progressing   Problem: Health Behavior/Discharge Planning: Goal: Compliance with prescribed medication regimen will improve Outcome: Progressing   Problem: Medication: Goal: Risk for medication side effects will decrease Outcome: Progressing   Problem: Clinical Measurements: Goal: Complications related to the disease process, condition or treatment will be avoided or minimized Outcome: Progressing Goal: Diagnostic test results will improve Outcome: Progressing   Problem: Safety: Goal: Verbalization of understanding the information provided will improve Outcome: Progressing   Problem: Self-Concept: Goal: Level of anxiety will decrease Outcome: Progressing Goal: Ability to verbalize feelings about condition will improve Outcome:  Progressing

## 2024-01-02 NOTE — Progress Notes (Signed)
 Progress Note   Patient: Rhonda [name] FMW:[id number] DOB: 08/06/1955 DOA: 12/22/2023     11 DOS: the patient was seen and examined on 01/02/2024   Brief hospital course: Rhonda [name] was admitted to the hospital with sepsis due to urinary tract infection.    68 yo female with the past medical history of bladder cancer, sp nephrostomy tube placement 11/04, hyperlipidemia, CKD and hypertension who presented with altered mental status. She was noted to have a rapid change in her mentation. Initially feeling very weak, with nausea, vomiting and diarrhea. EMS was called and she was found hypotensive 82/58 with HR 130 bpm, she had one liter IV fluids and transported to the ED.  On her initial physical examination in the ED her blood pressure was 75/54, HR 108, RR 27, 02 saturation 97% and temperature 102.3 F  Lungs with no wheezing or rales, heart with S1 and S2 present and tachycardic, abdomen with no distention, soft and non tender, right nephrostomy tube in place, no lower extremity edema.    11/5 Admitted to icu  11/6 Admitted overnight, Pressor requirements have significant improved. Remains quite encephalopathic, agitated.  11/7 drastic increase in supplemental oxygen demand overnight now on 60 FiO2 with 45 L nasal cannula.  Remains confused and agitated 11/9 Pseudomonas bacteremia, cefepime changed to zosyn, cEEG c/w subclinical seizures, stopped after ativan and keppra load, neurology consulted 11/10 ongoing cEEG, vimpat added, MRI    11/13 transferred to [facility]. Positive diarrhea, possible due to tube feedings. Patient allowed to eat by mouth.  11/14 requested to remove cortrak, tolerating PO's 11/15 following commands appropriately, demonstrating some hematuria. Weak and deconditioned.  Assessment and Plan: * Complicated urinary tract infection -Septic shock present on admission has resolved.  -Pseudomonas bacteremia.  -Patient has completed antibiotic therapy ( Zosyn).   -maintain adequate hydration -continue outpatient follow up with urology service.  Acute hypoxemic respiratory failure  -is resolved..  -02 saturation today is 98% on room air.  -Patient very weak and deconditioned, needs further rehab.  - Anticipated CIR rehabilitation starting 01/02/2024 if patient otherwise stable  AKI (acute kidney injury) -Sp renal transplant.  -Hypomagnesemia, hypoPhosphatemia, hypernatremia.  -Sp right nephrostomy tube.  -Continue gentle fluid resuscitation as needed and encourage patient to adequate oral hydration.. -Continue immunosuppressive therapy with Tacrolimus and prednisone.  - Continue minimizing nephrotoxic agents and follow renal function trend.  Seizure (HCC) -Acute metabolic encephalopathy (sepsis and cefepime neurotoxicity), improving.  -Subacute CVA.  -Patient with significant improvement in her somnolence after discontinuing Vimpat. - Following neurology recommendations will initiate weaning off Keppra at home. -Continue aspirin and statin  -Continue seizure precautions.  -Coreg has been discontinued - Continue adequate oral intake.  Dyslipidemia -Continue statin.  Thrombocytopenia -Cell count overall stable  -DIC panel negative - Given ongoing hematuria process heparin protocol has been discontinued.  Bladder cancer [facility]) -Outpatient follow-up with primary urologist (Dr. Evern) [facility]. - Heparin products have been placed on hold and SCDs used for DVT prophylaxis - Continue to follow hemoglobin trend.  Follow up as outpatient  - Continue to follow hemoglobin trend.  Protein-calorie malnutrition, severe Nutritional supplementation  -Body mass index is 17.61 kg/m.  - Discussed with patient the importance of good oral intake and hydration.  Subjective:  No fever, no chest pain, no nausea, no vomiting.  Reporting hematuria.  Generally weak but and deconditioned; but in good spirit and looking to pursuit  rehabilitation  Physical Exam: Vitals:   01/02/24 0500 01/02/24 0600 01/02/24 0718 01/02/24  1203  BP:   136/75 134/80  Pulse:  (!) 104 94 96  Resp:  16 20 20   Temp:   98.7 F (37.1 C) 98.9 F (37.2 C)  TempSrc:   Oral Oral  SpO2:  97% 99% 100%  Weight: 45.1 kg     Height:       General exam: Alert, awake, oriented x 3; following commands appropriately and in good spirit. Respiratory system: Clear to auscultation. Respiratory effort normal.  Good saturation on room air. Cardiovascular system:RRR. No murmurs, rubs, gallops or JVD appreciated. Gastrointestinal system: Abdomen is nondistended, soft and nontender. No organomegaly or masses felt. Normal bowel sounds heard. Central nervous system: No focal neurological deficits. Extremities: No C/C/E, positive pedal pulses appreciated bilaterally. Skin: No petechiae. Psychiatry: Judgement and insight appear normal.  Flat affect appreciated on exam.  Latest data Reviewed: Basic metabolic panel: Sodium 137, potassium 4.3, chloride 102, bicarb 22, BUN 60, creatinine 2.06 and GFR 26. CBC: WBC 10.5, hemoglobin 8.5 and platelet count 225.   Family Communication: I spoke with patient's husband and patient's daughter at the bedside, we talked in detail about patient's condition, plan of care and prognosis and all questions were addressed.   Disposition: Status is: Inpatient Remains inpatient appropriate because: IV therapy and pending transfer to CIR.  Planned Discharge Destination: CIR   Author: Eric Nunnery, MD 01/02/2024 1:36 PM  For on call review [email or web address].

## 2024-01-03 DIAGNOSIS — N390 Urinary tract infection, site not specified: Secondary | ICD-10-CM | POA: Diagnosis not present

## 2024-01-03 DIAGNOSIS — E785 Hyperlipidemia, unspecified: Secondary | ICD-10-CM | POA: Diagnosis not present

## 2024-01-03 DIAGNOSIS — R569 Unspecified convulsions: Secondary | ICD-10-CM | POA: Diagnosis not present

## 2024-01-03 DIAGNOSIS — N179 Acute kidney failure, unspecified: Secondary | ICD-10-CM | POA: Diagnosis not present

## 2024-01-03 LAB — BASIC METABOLIC PANEL WITH GFR
Anion gap: 8 (ref 5–15)
BUN: 41 mg/dL — ABNORMAL HIGH (ref 8–23)
CO2: 21 mmol/L — ABNORMAL LOW (ref 22–32)
Calcium: 8.4 mg/dL — ABNORMAL LOW (ref 8.9–10.3)
Chloride: 111 mmol/L (ref 98–111)
Creatinine, Ser: 1.55 mg/dL — ABNORMAL HIGH (ref 0.44–1.00)
GFR, Estimated: 36 mL/min — ABNORMAL LOW (ref >60)
Glucose, Bld: 49 mg/dL — ABNORMAL LOW (ref 70–99)
Potassium: 4.2 mmol/L (ref 3.5–5.1)
Sodium: 140 mmol/L (ref 135–145)

## 2024-01-03 LAB — GLUCOSE, CAPILLARY
Glucose-Capillary: 105 mg/dL — ABNORMAL HIGH (ref 70–99)
Glucose-Capillary: 135 mg/dL — ABNORMAL HIGH (ref 70–99)
Glucose-Capillary: 84 mg/dL (ref 70–99)

## 2024-01-03 LAB — CBC
HCT: 26.3 % — ABNORMAL LOW (ref 36.0–46.0)
Hemoglobin: 8.2 g/dL — ABNORMAL LOW (ref 12.0–15.0)
MCH: 30.6 pg (ref 26.0–34.0)
MCHC: 31.2 g/dL (ref 30.0–36.0)
MCV: 98.1 fL (ref 80.0–100.0)
Platelets: 312 K/uL (ref 150–400)
RBC: 2.68 MIL/uL — ABNORMAL LOW (ref 3.87–5.11)
RDW: 14.6 % (ref 11.5–15.5)
WBC: 10.6 K/uL — ABNORMAL HIGH (ref 4.0–10.5)
nRBC: 0 % (ref 0.0–0.2)

## 2024-01-03 NOTE — Progress Notes (Signed)
 Progress Note   Patient: Rhonda [name] FMW:[id number] DOB: January 20, 1956 DOA: 12/22/2023     12 DOS: the patient was seen and examined on 01/03/2024   Brief hospital course: Rhonda [name] was admitted to the hospital with sepsis due to urinary tract infection.   68 yo female with the past medical history of bladder cancer, sp nephrostomy tube placement 11/04, hyperlipidemia, CKD and hypertension who presented with altered mental status. She was noted to have a rapid change in her mentation. Initially feeling very weak, with nausea, vomiting and diarrhea. EMS was called and she was found hypotensive 82/58 with HR 130 bpm, she had one liter IV fluids and transported to the ED.  On her initial physical examination in the ED her blood pressure was 75/54, HR 108, RR 27, 02 saturation 97% and temperature 102.3 F  Lungs with no wheezing or rales, heart with S1 and S2 present and tachycardic, abdomen with no distention, soft and non tender, right nephrostomy tube in place, no lower extremity edema.   11/5 Admitted to icu  11/6 Admitted overnight, Pressor requirements have significant improved. Remains quite encephalopathic, agitated.  11/7 drastic increase in supplemental oxygen demand overnight now on 60 FiO2 with 45 L nasal cannula.  Remains confused and agitated 11/9 Pseudomonas bacteremia, cefepime changed to zosyn, cEEG c/w subclinical seizures, stopped after ativan and keppra load, neurology consulted 11/10 ongoing cEEG, vimpat added, MRI   11/13 transferred to [facility]. Positive diarrhea, possible due to tube feedings. Patient allowed to eat by mouth.  11/14 requested to remove cortrak   Assessment and Plan: * Complicated urinary tract infection Septic shock present on admission has resolved.  Pseudomonas bacteremia.   Completed antibiotic therapy today, with Zosyn.   Acute hypoxemic respiratory failure is improving.  02 saturation today is 98% on room air.  Improved mobility and  recommendations now are for home health services.   AKI (acute kidney injury) Sp renal transplant.  Hypomagnesemia, hypoPhosphatemia, hypernatremia.  Sp right nephrostomy tube.   Continue to improve renal function with serum cr at 1,5 with K at 4.2 and serum bicarbonate at 21  Na 140  Patient tolerating po well, continue to hold on IV fluids.   Continue immunosuppressive therapy with Tacrolimus and prednisone.   Seizure (HCC) Acute metabolic encephalopathy (sepsis and cefepime neurotoxicity), improving.  Subacute CVA.   Patient is off Vimpat  Today will discontinue keppra  Continue aspirin and statin  Seizure precautions.   Dyslipidemia Continue statin therapy   Thrombocytopenia Cell count stable  DIC panel negative   Bladder cancer (HCC) Follow up as outpatient   Protein-calorie malnutrition, severe Nutritional supplementation         Subjective: patient is feeling better, she has been working with physical therapy and tolerating po well.   Physical Exam: Vitals:   01/03/24 0015 01/03/24 0400 01/03/24 0441 01/03/24 0730  BP: 137/89 123/70  139/77  Pulse: (!) 109 95  99  Resp: 18 18  17   Temp: 98 F (36.7 C) 98.4 F (36.9 C)  98.4 F (36.9 C)  TempSrc: Oral Oral  Oral  SpO2: 100% 100%  98%  Weight:   45.2 kg   Height:       Neurology awake and alert, deconditioned ENT with mild pallor Cardiovascular with S1 and S2 present and regular with no gallops , rubs or murmurs Respiratory with no rales or wheezing Abdomen with no distention  No lower extremity edema   Data Reviewed:    Family Communication:  no family at the bedside   Disposition: Status is: Inpatient Remains inpatient appropriate because: possible discharge home tomorrow   Planned Discharge Destination: Home     Author: Elidia Toribio Furnace, MD 01/03/2024 9:11 AM  For on call review [email or web address].

## 2024-01-03 NOTE — Telephone Encounter (Signed)
 Patient husband calling to reschedule for surgery and Pre and Post OP please advise     CB# [phone]

## 2024-01-03 NOTE — TOC Progression Note (Signed)
 Transition of Care [facility]) - Progression Note    Patient Details  Name: Rhonda [name] MRN: [id number] Date of Birth: [birth year]  Transition [facility]) CM/SW Contact  Luise JAYSON Pan, [place] Phone Number: 01/03/2024, 10:35 AM  Clinical Narrative:   Awaiting CIR bed.  TOC will continue to follow.                       Expected Discharge Plan and Services                                               Social Drivers of Health (SDOH) Interventions SDOH Screenings   Food Insecurity: No Food Insecurity (12/24/2023)  Housing: Low Risk  (12/24/2023)  Transportation Needs: No Transportation Needs (12/24/2023)  Utilities: Not At Risk (12/24/2023)  Alcohol Screen: Low Risk  (11/29/2023)  Depression (PHQ2-9): Low Risk  (12/21/2023)  Financial Resource Strain: Low Risk  (11/29/2023)  Physical Activity: Sufficiently Active (11/29/2023)  Social Connections: Moderately Integrated (12/24/2023)  Stress: Stress Concern Present (11/29/2023)  Tobacco Use: Medium Risk (12/24/2023)    Readmission Risk Interventions     No data to display

## 2024-01-03 NOTE — Inpatient Diabetes Management (Signed)
 Inpatient Diabetes Program Recommendations  AACE/ADA: New Consensus Statement on Inpatient Glycemic Control   Target Ranges:  Prepandial:   less than 140 mg/dL      Peak postprandial:   less than 180 mg/dL (1-2 hours)      Critically ill patients:  140 - 180 mg/dL   Lab Results  Component Value Date   GLUCAP 105 (H) 01/03/2024   HGBA1C 6.3 (H) 12/28/2023    Latest Reference Range & Units 01/02/24 08:05 01/02/24 12:06 01/02/24 15:42 01/02/24 20:12 01/03/24 00:21 01/03/24 04:33 01/03/24 07:29  Glucose-Capillary 70 - 99 mg/dL 894 (H) 828 (H) 780 (H) 126 (H) 135 (H) 84 105 (H)   Review of Glycemic Control  Diabetes history: None  Outpatient Diabetes medications: None  Current orders for Inpatient glycemic control:  Novoloh 0-15 units Q4HRS   Patient receiving Prednisone 10 mg daily.   Inpatient Diabetes Program Recommendations:   Noted hypoglycemia this morning at 0247.   Please consider decreasing Novolog correction to 0-9 units Q4HRS.   Thanks,  Lavanda Search, RN, MSN, [facility]  Inpatient Diabetes Coordinator  Pager [phone] (8a-5p)

## 2024-01-03 NOTE — Plan of Care (Signed)

## 2024-01-03 NOTE — Progress Notes (Signed)
 Occupational Therapy Treatment Patient Details Name: Rhonda [name] MRN: [id number] DOB: 12-14-1955 Today's Date: 01/03/2024   History of present illness 68 yo F adm 12/22/23 with ALOC, UTI, septic shock, nonconvulsive status epilepticus. PMH:  DM, bladder CA, HTN, HLD, s/p Kidney transplant   OT comments  Pt making good progress with functional goals. Updating d/c recommendation to [facility]; pt's family will be able to assist 24/7 as needed. Pt educated on fall prevention and safety with use of tub bench and LH bath sponge for home. OT will continue to follow acutely to maximize level of function and safety      If plan is discharge home, recommend the following:  A little help with bathing/dressing/bathroom;A little help with walking and/or transfers;Assistance with cooking/housework;Assist for transportation;Help with stairs or ramp for entrance   Equipment Recommendations  Tub/shower bench;Other (comment) (LH bath sponge)    Recommendations for Other Services      Precautions / Restrictions Precautions Precautions: Fall Recall of Precautions/Restrictions: Intact Precaution/Restrictions Comments: nephrostomy tube Restrictions Weight Bearing Restrictions Per Provider Order: No       Mobility Bed Mobility Overal bed mobility: Modified Independent Bed Mobility: Supine to Sit, Sit to Supine                Transfers Overall transfer level: Needs assistance Equipment used: 1 person hand held assist   Sit to Stand: Contact guard assist Stand pivot transfers: Contact guard assist   Step pivot transfers: Contact guard assist     General transfer comment: cues for hand placement STS from commode and in shower     Balance Overall balance assessment: Needs assistance Sitting-balance support: No upper extremity supported, Feet supported Sitting balance-Leahy Scale: Good     Standing balance support: No upper extremity supported, During functional activity Standing  balance-Leahy Scale: Fair Standing balance comment: unsteady with 1 LOB stepping in/out of shower as simulated for tub shower                           ADL either performed or assessed with clinical judgement   ADL Overall ADL's : Needs assistance/impaired     Grooming: Wash/dry hands;Wash/dry face;Brushing hair;Standing;Contact guard assist       Lower Body Bathing: Minimal assistance;Contact guard assist;Sit to/from stand;Cueing for safety       Lower Body Dressing: Minimal assistance;Contact guard assist;Sit to/from stand;Cueing for safety   Toilet Transfer: Contact guard assist;Ambulation;Grab Ship Broker- Clothing Manipulation and Hygiene: Sit to/from stand;Contact guard assist   Tub/ Shower Transfer: Contact guard assist;Ambulation;Grab bars;Cueing for Contractor Details (indicate cue type and reason): LOB x 1 with simulated stepping over for tub shower Functional mobility during ADLs: Contact guard assist;Cueing for safety General ADL Comments: educated pt on use of tub bench and LH bath sponge for home use. Assisted pt in finding pictures of ADs with her phone (printers down and unable to print off for pt)    Extremity/Trunk Assessment Upper Extremity Assessment Upper Extremity Assessment: Generalized weakness   Lower Extremity Assessment Lower Extremity Assessment: Defer to PT evaluation        Vision Ability to See in Adequate Light: 0 Adequate Patient Visual Report: No change from baseline     Perception     Praxis     Communication Communication Communication: No apparent difficulties   Cognition Arousal: Alert Behavior During Therapy: [facility] for tasks assessed/performed  Following commands: Intact Following commands impaired: Follows one step commands with increased time, Follows one step commands inconsistently      Cueing   Cueing Techniques: Verbal cues   Exercises      Shoulder Instructions       General Comments      Pertinent Vitals/ Pain       Pain Assessment Pain Assessment: No/denies pain Faces Pain Scale: No hurt Pain Intervention(s): Monitored during session, Repositioned  Home Living                                          Prior Functioning/Environment              Frequency  Min 2X/week        Progress Toward Goals  OT Goals(current goals can now be found in the care plan section)  Progress towards OT goals: Progressing toward goals     Plan      Co-evaluation                 AM-PAC OT 6 Clicks Daily Activity     Outcome Measure   Help from another person eating meals?: None Help from another person taking care of personal grooming?: A Little Help from another person toileting, which includes using toliet, bedpan, or urinal?: A Little Help from another person bathing (including washing, rinsing, drying)?: A Little Help from another person to put on and taking off regular upper body clothing?: A Little Help from another person to put on and taking off regular lower body clothing?: A Little 6 Click Score: 19    End of Session Equipment Utilized During Treatment: Gait belt  OT Visit Diagnosis: Unsteadiness on feet (R26.81);Muscle weakness (generalized) (M62.81);History of falling (Z91.81);Feeding difficulties (R63.3);Other symptoms and signs involving cognitive function   Activity Tolerance Patient tolerated treatment well   Patient Left in bed;with call bell/phone within reach;with bed alarm set   Nurse Communication Mobility status        Time: 8980-8953 OT Time Calculation (min): 27 min  Charges: OT General Charges $OT Visit: 1 Visit OT Treatments $Self Care/Home Management : 8-22 mins $Therapeutic Activity: 8-22 mins    Jacques Karna Loose 01/03/2024, 1:13 PM

## 2024-01-03 NOTE — Progress Notes (Addendum)
 Inpatient Rehab Admissions Coordinator:   I did receive auth for CIR, but patient has progressed substantially.  Discussed with PT this AM and pt/family in agreement to discharge home when medically cleared.  Will notify insurance and sign off.     Reche Lowers, PT, DPT Admissions Coordinator [phone] 01/03/24 11:12 AM

## 2024-01-03 NOTE — Progress Notes (Addendum)
 Physical Therapy Treatment Patient Details Name: Rhonda [name] MRN: [id number] DOB: 1955-03-30 Today's Date: 01/03/2024   History of Present Illness 68 yo F adm 12/22/23 with ALOC, UTI, septic shock, nonconvulsive status epilepticus. PMH:  DM, bladder CA, HTN, HLD, s/p Kidney transplant    PT Comments  Pt pleasant, eager to walk and get stronger. Pt with exceptionally improved cognition and mobility this session able to walk CGA without AD long hall distance with noted balance deficits and continued recommendation for RW when not with therapy. Pt will have assist of spouse 24hrs at home and both in agreement with current progress that HHPT is appropriate. Pt educated for HEP and continued progression. Encouraged increased mobility with staff and OOB for meals.    If plan is discharge home, recommend the following: Assistance with cooking/housework;Direct supervision/assist for medications management;Direct supervision/assist for financial management;Assist for transportation;Help with stairs or ramp for entrance;Supervision due to cognitive status;A little help with walking and/or transfers;A little help with bathing/dressing/bathroom   Can travel by private vehicle        Equipment Recommendations  Rolling walker (2 wheels)    Recommendations for Other Services       Precautions / Restrictions Precautions Precautions: Fall Recall of Precautions/Restrictions: Intact Precaution/Restrictions Comments: nephrostomy tube     Mobility  Bed Mobility Overal bed mobility: Modified Independent             General bed mobility comments: HOB 25 degrees with pt rolling to left and rise from side without physical assist    Transfers Overall transfer level: Needs assistance     Sit to Stand: Contact guard assist           General transfer comment: cues for hand placement to rise from bed, toilet, and chair (x2)    Ambulation/Gait Ambulation/Gait assistance: Contact  guard assist Gait Distance (Feet): 300 Feet Assistive device: Rolling walker (2 wheels), None Gait Pattern/deviations: Step-through pattern, Narrow base of support   Gait velocity interpretation: 1.31 - 2.62 ft/sec, indicative of limited community ambulator   General Gait Details: pt walked first 100' with RW with good stability and then able to transition to no UB support with 2 partial LOB requiring tactile cues to correct. Pt walked 300' then additional 100' after seated rest   Stairs Stairs: Yes Stairs assistance: Contact guard assist Stair Management: Alternating pattern, Forwards, One rail Right Number of Stairs: 5 General stair comments: guarding for safety with reliance on rail   Wheelchair Mobility     Tilt Bed    Modified Rankin (Stroke Patients Only)       Balance Overall balance assessment: Needs assistance Sitting-balance support: No upper extremity supported, Feet supported Sitting balance-Leahy Scale: Good     Standing balance support: No upper extremity supported, During functional activity Standing balance-Leahy Scale: Good                              Communication Communication Communication: No apparent difficulties  Cognition Arousal: Alert Behavior During Therapy: Flat affect   PT - Cognitive impairments: Memory, Safety/Judgement                       PT - Cognition Comments: pt conversational, pleasant, oriented with some confusion with memory and safety, following commands well Following commands: Intact      Cueing Cueing Techniques: Verbal cues  Exercises General Exercises - Lower Extremity Long Arc Quad: Strengthening, Both,  Seated, AROM, 15 reps Hip Flexion/Marching: AROM, Strengthening, Seated, Both, 15 reps    General Comments        Pertinent Vitals/Pain Pain Assessment Pain Assessment: No/denies pain    Home Living                          Prior Function            PT Goals (current  goals can now be found in the care plan section) Progress towards PT goals: Progressing toward goals    Frequency    Min 3X/week      PT Plan      Co-evaluation              AM-PAC PT 6 Clicks Mobility   Outcome Measure  Help needed turning from your back to your side while in a flat bed without using bedrails?: None Help needed moving from lying on your back to sitting on the side of a flat bed without using bedrails?: None Help needed moving to and from a bed to a chair (including a wheelchair)?: A Little Help needed standing up from a chair using your arms (e.g., wheelchair or bedside chair)?: A Little Help needed to walk in hospital room?: A Little Help needed climbing 3-5 steps with a railing? : A Little 6 Click Score: 20    End of Session Equipment Utilized During Treatment: Gait belt Activity Tolerance: Patient tolerated treatment well Patient left: in chair;with chair alarm set;with call bell/phone within reach Nurse Communication: Mobility status PT Visit Diagnosis: Other abnormalities of gait and mobility (R26.89);Muscle weakness (generalized) (M62.81);Difficulty in walking, not elsewhere classified (R26.2);Other symptoms and signs involving the nervous system (R29.898)     Time: 9245-9166 PT Time Calculation (min) (ACUTE ONLY): 39 min  Charges:    $Gait Training: 23-37 mins $Therapeutic Activity: 8-22 mins PT General Charges $$ ACUTE PT VISIT: 1 Visit                     [name], PT Acute Rehabilitation Services Office: [phone]    Lenoard NOVAK Elroy Schembri 01/03/2024, 9:42 AM

## 2024-01-04 ENCOUNTER — Encounter: Payer: Self-pay | Admitting: Internal Medicine

## 2024-01-04 ENCOUNTER — Other Ambulatory Visit (HOSPITAL_COMMUNITY): Payer: Self-pay

## 2024-01-04 DIAGNOSIS — E785 Hyperlipidemia, unspecified: Secondary | ICD-10-CM | POA: Diagnosis not present

## 2024-01-04 DIAGNOSIS — N179 Acute kidney failure, unspecified: Secondary | ICD-10-CM | POA: Diagnosis not present

## 2024-01-04 DIAGNOSIS — N390 Urinary tract infection, site not specified: Secondary | ICD-10-CM | POA: Diagnosis not present

## 2024-01-04 DIAGNOSIS — R569 Unspecified convulsions: Secondary | ICD-10-CM | POA: Diagnosis not present

## 2024-01-04 MED ORDER — ASPIRIN 81 MG PO TBEC: 81.0000 mg | DELAYED_RELEASE_TABLET | Freq: Every day | ORAL | Status: DC

## 2024-01-04 MED ORDER — OXYBUTYNIN CHLORIDE 5 MG PO TABS
5.0000 mg | ORAL_TABLET | Freq: Three times a day (TID) | ORAL | 0 refills | Status: AC | PRN
  Filled 2024-01-04: qty 30, 10d supply, fill #0

## 2024-01-04 MED ORDER — ADULT MULTIVITAMIN W/MINERALS CH
1.0000 | ORAL_TABLET | Freq: Every day | ORAL | 0 refills | Status: AC
  Filled 2024-01-04: qty 30, 30d supply, fill #0

## 2024-01-04 MED ORDER — PRAVASTATIN SODIUM 20 MG PO TABS
20.0000 mg | ORAL_TABLET | Freq: Every day | ORAL | 0 refills | Status: AC
  Filled 2024-01-04: qty 30, 30d supply, fill #0

## 2024-01-04 MED ORDER — KATE FARMS STANDARD 1.4 EN LIQD
325.0000 mL | Freq: Two times a day (BID) | ENTERAL | 0 refills | Status: AC
  Filled 2024-01-04: qty 19500, 30d supply, fill #0

## 2024-01-04 MED ORDER — ASPIRIN 81 MG PO TBEC
81.0000 mg | DELAYED_RELEASE_TABLET | Freq: Every day | ORAL | 0 refills | Status: AC
  Filled 2024-01-04: qty 30, 30d supply, fill #0

## 2024-01-04 MED ORDER — ACETAMINOPHEN 325 MG PO TABS: 650.0000 mg | ORAL_TABLET | Freq: Four times a day (QID) | ORAL | Status: AC | PRN

## 2024-01-04 MED ORDER — PANTOPRAZOLE SODIUM 40 MG PO TBEC
40.0000 mg | DELAYED_RELEASE_TABLET | Freq: Every day | ORAL | 0 refills | Status: AC
  Filled 2024-01-04: qty 15, 15d supply, fill #0

## 2024-01-04 NOTE — Telephone Encounter (Signed)
 Patient is calling back to clinic to reschedule her surgery. She is hoping to get back in to see Dr. Evern as soon as possible.   Callback for patient [phone]

## 2024-01-04 NOTE — Discharge Summary (Signed)
 Physician Discharge Summary   Patient: Rhonda [name] MRN: [id number] DOB: 1955/10/14  Admit date:     12/22/2023  Discharge date: 01/04/24  Discharge Physician: Elidia Sieving Ellyssa Zagal   PCP: Gladystine Erminio CROME, MD   Recommendations at discharge:    Patient has been placed on aspirin and statin for CVA.  Continue immunosuppressive therapy for renal transplant Follow up renal function and electrolytes in 7 days as outpatient Follow up with Dr Gladystine in 7 to 10 days Follow up with Oncology/ Urology as outpatient as scheduled.   I spoke with patient's husband and daughter at the bedside, we talked in detail about patient's condition, plan of care and prognosis and all questions were addressed.   Discharge Diagnoses: Principal Problem:   Complicated urinary tract infection Active Problems:   AKI (acute kidney injury)   Seizure (HCC)   Dyslipidemia   Thrombocytopenia   Bladder cancer (HCC)   Protein-calorie malnutrition, severe  Resolved Problems:   * No resolved hospital problems. [facility] Course: Rhonda [name] was admitted to the hospital with sepsis due to urinary tract infection, complicated with septic shock.   68 yo female with the past medical history of bladder cancer, sp nephrostomy tube placement 11/04, hyperlipidemia, CKD and hypertension who presented with altered mental status. She was noted to have a rapid change in her mentation. Initially feeling very weak, with nausea, vomiting and diarrhea. EMS was called and she was found hypotensive 82/58 with HR 130 bpm, she had one liter IV fluids and transported to the ED.  On her initial physical examination in the ED her blood pressure was 75/54, HR 108, RR 27, 02 saturation 97% and temperature 102.3 F  Lungs with no wheezing or rales, heart with S1 and S2 present and tachycardic, abdomen with no distention, soft and non tender, right nephrostomy tube in place, no lower extremity edema.   Na 139, K 4.2 Cl 102  bicarbonate 11 glucose 57 bun 57 cr 4.28  AST 110 ALT 36  Wbc 16.0 hgb 11.4 plt 191  Sars covid 19 negative Influenza negative RSV negative   Urine analysis SG 1,022, protein > 300, positive nitrates, small leukocytes, small hgb, glucose 50, 11-20 wbc and 11-20 rbc.   Urine culture positive for Pseudomonas  Blood cultures positive for Pseudomonas Aeruginosa aerobic bottles x2 . (Resistant to ciprofloxacin).   Chest radiograph with no cardiomegaly, no effusions or infiltrates.   EKG 131 bpm, normal axis, normal intervals, qtc 455, sinus rhythm with no significant ST segment or T wave changes.   CT head with no acute changes.  CT renal with interval placement of percutaneous nephrostomy in the inferior right lower quadrant transplanted kidney with near resolution of the previously seen hydronephrosis. No stones or peri transplant fluid collection.  Mild stranding adjacent to the transplant kidney and urinary bladder.  Sigmoid diverticulosis with perisigmoid stranding in the pelvis.  Small bowel ileus vs developing obstruction.  Cholelithiasis.   11/5 Admitted to icu  11/6 Admitted overnight, Pressor requirements have significant improved. Remains quite encephalopathic, agitated.  11/7 drastic increase in supplemental oxygen demand overnight now on 60 FiO2 with 45 L nasal cannula.  Remains confused and agitated 11/9 Pseudomonas bacteremia, cefepime changed to zosyn, cEEG c/w subclinical seizures, stopped after ativan and keppra load, neurology consulted 11/10 ongoing cEEG, vimpat added, MRI   11/13 transferred to [facility]. Positive diarrhea, possible due to tube feedings. Patient allowed to eat by mouth.  11/14 requested to remove cortrak  11/18  mentation continued to improve, no further seizures.  Patient has recovered well and will be discharged home to continue home health services.   Assessment and Plan: * Complicated urinary tract infection Septic shock present on admission has  resolved.  Pseudomonas bacteremia.   Completed antibiotic therapy with Zosyn.   Acute hypoxemic respiratory failure resolved.  02 saturation today is 98% on room air.  Improved mobility and recommendations now are for home health services.   AKI (acute kidney injury) Sp renal transplant.  Hypomagnesemia, hypoPhosphatemia, hypernatremia.  Sp right nephrostomy tube.   Continue to improve renal function with serum cr at 1,5 with K at 4.2 and serum bicarbonate at 21  Na 140  Patient tolerating po well  Continue immunosuppressive therapy with Tacrolimus and prednisone.  Nephrostomy tube in place.   Seizure (HCC) Acute metabolic encephalopathy (sepsis and cefepime neurotoxicity), resolved.    Subacute CVA.  Brain MRI with punctate focus of subacute infarct in the right centrum semiovale. No significant edema or midline shift.  Minimal perivascular white matter FLAIR hyperintensity.  No abnormal intracranial enhancement.   Carotid doppler US  with right ICA velocities 1-39% and left ICA velocities 1-39% Vertebrals with antegrade flow bilaterally Subclavian with normal flow hemodynamics bilaterally.   Patient was placed on Vimpat and Keppra, medications were subsequently tapered off with no complications. No further seizures.   Patient will continue medical therapy with aspirin and statin. Follow up as outpatient.   Dyslipidemia Continue statin therapy   Thrombocytopenia Cell count stable  DIC panel negative   Bladder cancer (HCC) Follow up as outpatient   Protein-calorie malnutrition, severe Nutritional supplementation          Consultants: neurology inpatient rehab.  Procedures performed: none   Disposition: Home Diet recommendation:  Regular diet DISCHARGE MEDICATION: Allergies as of 01/04/2024       Reactions   Sulfa Antibiotics Rash        Medication List     STOP taking these medications    lovastatin 20 MG tablet Commonly known as:  MEVACOR       TAKE these medications    acetaminophen  325 MG tablet Commonly known as: TYLENOL  Take 2 tablets (650 mg total) by mouth every 6 (six) hours as needed for moderate pain (pain score 4-6) or mild pain (pain score 1-3).   aspirin EC 81 MG tablet Take 1 tablet (81 mg total) by mouth daily. Swallow whole. Start taking on: January 05, 2024   cyanocobalamin 1000 MCG tablet Commonly known as: VITAMIN B12 Take 1,000 mcg by mouth every other day.   feeding supplement (KATE FARMS STANDARD ENT 1.4) Liqd liquid Take 325 mLs by mouth 2 (two) times daily between meals.   multivitamin with minerals Tabs tablet Take 1 tablet by mouth daily. Start taking on: January 05, 2024   oxybutynin 5 MG tablet Commonly known as: DITROPAN Take 1 tablet (5 mg total) by mouth every 8 (eight) hours as needed for bladder spasms.   pantoprazole 40 MG tablet Commonly known as: PROTONIX Take 1 tablet (40 mg total) by mouth daily for 15 days. Start taking on: January 05, 2024   pravastatin 20 MG tablet Commonly known as: PRAVACHOL Take 1 tablet (20 mg total) by mouth daily at 6 PM.   predniSONE 10 MG tablet Commonly known as: DELTASONE Take 10 mg by mouth daily.   tacrolimus 1 MG capsule Commonly known as: PROGRAF Take 2-3 mg by mouth See admin instructions. Take 3 capsules by mouth in the  morning and 2 capsules at night   Vitamin D -3 25 MCG (1000 UT) Caps Take 1,000 capsules by mouth daily.               Discharge Care Instructions  (From admission, onward)           Start     Ordered   01/04/24 0000  Discharge wound care:       Comments: Wound care  Daily      Comments: Cleanse R forearm/hand with soap and water, dry and apply Xeroform gauze ([place] [phone]) to area daily, secure with loosely wrapped Kerlix and keep arm elevated.   01/04/24 1156            Discharge Exam: Filed Weights   01/02/24 0500 01/03/24 0441 01/04/24 0500  Weight: 45.1 kg 45.2 kg 45.3  kg   BP 107/71 (BP Location: Left Arm)   Pulse (!) 128   Temp 97.8 F (36.6 C) (Oral)   Resp 18   Ht 5' 3 (1.6 m)   Wt 45.3 kg   SpO2 97%   BMI 17.69 kg/m   Patient is feeling well, she is tolerating po well with no nausea or vomiting, no diarrhea. Has been ambulating with physical therapy   Neurology awake and alert ENT with mild pallor  Cardiovascular with S1 and S2 present and regular with no gallops, rubs or murmurs Respiratory with no rales or wheezing, no rhonchi  Abdomen with no distention, soft and non tender, nephrostomy tube in place.   No lower extremity edema   Condition at discharge: stable  The results of significant diagnostics from this hospitalization (including imaging, microbiology, ancillary and laboratory) are listed below for reference.   Imaging Studies: VAS US  CAROTID Result Date: 12/28/2023 Carotid Arterial Duplex Study Patient Name:  Daanya PAULINE [facility]  Date of Exam:   12/28/2023 Medical Rec #: [id number]               Accession #:    [id number] Date of Birth: 06-08-1955                Patient Gender: F Patient Age:   65 years Exam Location:  [facility] Procedure:      VAS US  CAROTID Referring Phys: ARLIN KREBS --------------------------------------------------------------------------------  Indications:      CVA. Risk Factors:     Hypertension, hyperlipidemia, Diabetes, past history of                   smoking. Comparison Study: No prior exam. Performing Technologist: Edilia Elden Appl  Examination Guidelines: A complete evaluation includes B-mode imaging, spectral Doppler, color Doppler, and power Doppler as needed of all accessible portions of each vessel. Bilateral testing is considered an integral part of a complete examination. Limited examinations for reoccurring indications may be performed as noted.  Right Carotid Findings: +----------+--------+--------+--------+-------------------------+--------+           PSV cm/sEDV  cm/sStenosisPlaque Description       Comments +----------+--------+--------+--------+-------------------------+--------+ CCA Prox  61      15                                                +----------+--------+--------+--------+-------------------------+--------+ CCA Distal47      17              heterogenous and calcific         +----------+--------+--------+--------+-------------------------+--------+  ICA Prox  29      10              heterogenous and calcific         +----------+--------+--------+--------+-------------------------+--------+ ICA Mid   56      24                                                +----------+--------+--------+--------+-------------------------+--------+ ICA Distal47      13                                                +----------+--------+--------+--------+-------------------------+--------+ ECA       79      15                                                +----------+--------+--------+--------+-------------------------+--------+ +----------+--------+-------+----------------+-------------------+           PSV cm/sEDV cmsDescribe        Arm Pressure (mmHG) +----------+--------+-------+----------------+-------------------+ Subclavian100     12     Multiphasic, WNL                    +----------+--------+-------+----------------+-------------------+ +---------+--------+--+--------+--+---------+ VertebralPSV cm/s36EDV cm/s11Antegrade +---------+--------+--+--------+--+---------+  Left Carotid Findings: +----------+--------+--------+--------+-------------------------+--------+           PSV cm/sEDV cm/sStenosisPlaque Description       Comments +----------+--------+--------+--------+-------------------------+--------+ CCA Prox  56      12                                                +----------+--------+--------+--------+-------------------------+--------+ CCA Distal54      15              heterogenous  and calcific         +----------+--------+--------+--------+-------------------------+--------+ ICA Prox  49      22              heterogenous and calcific         +----------+--------+--------+--------+-------------------------+--------+ ICA Mid   51      18                                                +----------+--------+--------+--------+-------------------------+--------+ ICA Distal78      32                                                +----------+--------+--------+--------+-------------------------+--------+ ECA       52      12                                                +----------+--------+--------+--------+-------------------------+--------+ +----------+--------+--------+----------------+-------------------+  PSV cm/sEDV cm/sDescribe        Arm Pressure (mmHG) +----------+--------+--------+----------------+-------------------+ Subclavian99      18      Multiphasic, WNL                    +----------+--------+--------+----------------+-------------------+ +---------+--------+--+--------+--+---------+ VertebralPSV cm/s36EDV cm/s11Antegrade +---------+--------+--+--------+--+---------+   Summary: Right Carotid: Velocities in the right ICA are consistent with a 1-39% stenosis. Left Carotid: Velocities in the left ICA are consistent with a 1-39% stenosis. Vertebrals:  Bilateral vertebral arteries demonstrate antegrade flow. Subclavians: Normal flow hemodynamics were seen in bilateral subclavian              arteries. *See table(s) above for measurements and observations.  Electronically signed by Penne Colorado MD on 12/28/2023 at 5:05:15 PM.    Final    DG Chest Port 1 View Result Date: 12/28/2023 CLINICAL DATA:  Hypoxia. EXAM: PORTABLE CHEST 1 VIEW COMPARISON:  12/24/2023 FINDINGS: The cardiopericardial silhouette is within normal limits for size. Marked improvement in aeration of both lungs with moderate left pleural effusion seen previously  largely resolved in the interval. Small persistent right pleural effusion evident with bibasilar atelectasis/infiltrate. Telemetry leads overlie the chest. A feeding tube passes into the stomach although the distal tip position is not included on the film. IMPRESSION: Marked improvement in aeration of both lungs with small persistent right pleural effusion and bibasilar atelectasis/infiltrate. Electronically Signed   By: Camellia Candle M.D.   On: 12/28/2023 07:24   MR BRAIN W WO CONTRAST Result Date: 12/27/2023 EXAM: MRI BRAIN WITH AND WITHOUT CONTRAST 12/27/2023 05:49:53 PM TECHNIQUE: Multiplanar multisequence MRI of the head/brain was performed with and without the administration of intravenous contrast. 5 mL (gadobutrol (GADAVIST) 1 MMOL/ML injection 5 mL GADOBUTROL 1 MMOL/ML IV SOLN) was administered. COMPARISON: CT head 03/03/2023. CLINICAL HISTORY: Seizure, new-onset, no history of trauma. FINDINGS: BRAIN AND VENTRICLES: No acute infarct. No acute intracranial hemorrhage. No mass effect or midline shift. No hydrocephalus. The sella is unremarkable. Normal flow voids. There is a punctate focus of diffusion signal abnormality in the right centrum semiovale just superior to the body of the corpus callosum and just lateral to the cingulate gyrus. There is no definite ADC signal abnormality suggesting subacute chronicity. No significant associated edema. There is minimal FLAIR hyperintensity involving the periventricular white matter likely related to chronic microvascular ischemic changes. Normal appearance of the hippocampi and mesial temporal lobes. Hippocampal architecture is preserved. There is no evidence of signal abnormality in the mesial temporal lobes. The fornices are symmetric. No abnormal intracranial enhancement. ORBITS: Bilateral lens replacement. SINUSES: Focal mucosal thickening in the left posterior ethmoid air cells. Bilateral mastoid effusions. BONES AND SOFT TISSUES: Normal bone marrow  signal and enhancement. No acute soft tissue abnormality. IMPRESSION: 1. Punctate focus of subacute infarct in the right centrum semiovale. No significant edema or midline shift. 2. Minimal periventricular white matter FLAIR hyperintensity, likely chronic microvascular ischemic change. 3. No abnormal intracranial enhancement. Electronically signed by: Donnice Mania MD 12/27/2023 07:02 PM EST RP Workstation: [id number]   DG Abd Portable 1V Result Date: 12/27/2023 CLINICAL DATA:  Feeding tube placement. EXAM: PORTABLE ABDOMEN - 1 VIEW COMPARISON:  None Available. FINDINGS: Enteric tube with tip in the upper abdomen to the left of spine, likely in the region of the ligament of Treitz. No bowel dilatation. Degenerative changes of the spine. IMPRESSION: Enteric tube with tip in the region of the ligament of Treitz. Electronically Signed   By: Vanetta Chou M.D.   On:  12/27/2023 11:20   Overnight EEG with video Result Date: 12/27/2023 Shelton Arlin KIDD, MD     12/28/2023  6:28 AM Patient Name: Buna Cuppett MRN: [id number] Epilepsy Attending: Arlin KIDD Shelton Referring Physician/Provider: Sharie Bourbon, MD Duration: 12/26/2023 1159 to 12/27/2023 1159 Patient history: 68yo F with ams. EEG to evaluate for seizure Level of alertness: awake, asleep AEDs during EEG study: LEV, LCM Technical aspects: This EEG study was done with scalp electrodes positioned according to the 10-20 International system of electrode placement. Electrical activity was reviewed with band pass filter of 1-70Hz , sensitivity of 7 uV/mm, display speed of 52mm/sec with a 60Hz  notched filter applied as appropriate. EEG data were recorded continuously and digitally stored.  Video monitoring was available and reviewed as appropriate. Description: At the beginning of the study, EEG showed generalized periodic discharges with triphasic morphology at 3 to 3.5 Hz.  No clinical signs were noted.  This EEG pattern is concerning for  electrographic status epilepticus with generalized onset.  IV lorazepam and IV levetiracetam were administered.  Subsequently status epilepticus resolved at around 1423 on 12/26/2023.  EEG then showed continuous generalized polymorphic high amplitude sharply contoured 3 to 5 Hz theta-delta slowing.  When awake/stimulated, EEG again showed intermittent generalized periodic discharges with triphasic morphology at 1 to 2 Hz without definite evolution.  Sleep was characterized by sleep spindles (12 to 14 Hz), maximal frontocentral region.  Hyperventilation and photic stimulation were not performed.   ABNORMALITY - Electrographic status epilepticus, generalized - Periodic discharges with triphasic morphology, generalized - Continuous slow, generalized IMPRESSION: At the beginning of the study, EEG showed generalized electrographic status epilepticus.  As antiseizure medications were administered, status epilepticus resolved on 12/26/2023 at around 1423.  Subsequently EEG was suggestive of generalized cerebral dysfunction. Of note, EEG also showed generalized periodic discharges with triphasic morphology.  This pattern is on the ictal-interictal continuum with increased risk of seizure recurrence.  Of note given the morphology of discharges, this pattern is typically seen due to cefepime toxicity. Arlin KIDD Shelton   ECHOCARDIOGRAM COMPLETE Result Date: 12/24/2023    ECHOCARDIOGRAM REPORT   Patient Name:   Zaya Kessenich Date of Exam: 12/24/2023 Medical Rec #:  [id number]              Height:       63.0 in Accession #:    [id number]             Weight:       112.9 lb Date of Birth:  1955/07/26               BSA:          1.516 m Patient Age:    68 years               BP:           108/77 mmHg Patient Gender: F                      HR:           69 bpm. Exam Location:  Inpatient Procedure: 2D Echo (Both Spectral and Color Flow Doppler were utilized during            procedure). Indications:    Dyspnea  History:         Patient has no prior history of Echocardiogram examinations.                 Signs/Symptoms:Dyspnea.  Sonographer:  Norleen Amour Referring Phys: BENTON D HARRIS IMPRESSIONS  1. Left ventricular ejection fraction, by estimation, is 55 to 60%. The left ventricle has normal function. The left ventricle has no regional wall motion abnormalities. Left ventricular diastolic parameters were normal.  2. Right ventricular systolic function is normal. The right ventricular size is normal. There is normal pulmonary artery systolic pressure. The estimated right ventricular systolic pressure is 25.5 mmHg.  3. The mitral valve is normal in structure. Moderate to severe mitral valve regurgitation. No evidence of mitral stenosis.  4. Tricuspid valve regurgitation is moderate.  5. The aortic valve is tricuspid. Aortic valve regurgitation is moderate. Aortic valve sclerosis is present, with no evidence of aortic valve stenosis.  6. The inferior vena cava is normal in size with greater than 50% respiratory variability, suggesting right atrial pressure of 3 mmHg. Comparison(s): No prior Echocardiogram. FINDINGS  Left Ventricle: Left ventricular ejection fraction, by estimation, is 55 to 60%. The left ventricle has normal function. The left ventricle has no regional wall motion abnormalities. The left ventricular internal cavity size was normal in size. There is  no left ventricular hypertrophy. Left ventricular diastolic parameters were normal. Right Ventricle: The right ventricular size is normal. No increase in right ventricular wall thickness. Right ventricular systolic function is normal. There is normal pulmonary artery systolic pressure. The tricuspid regurgitant velocity is 2.37 m/s, and  with an assumed right atrial pressure of 3 mmHg, the estimated right ventricular systolic pressure is 25.5 mmHg. Left Atrium: Left atrial size was normal in size. Right Atrium: Right atrial size was normal in size. Pericardium: There is no  evidence of pericardial effusion. Mitral Valve: The mitral valve is normal in structure. Moderate to severe mitral valve regurgitation. No evidence of mitral valve stenosis. Tricuspid Valve: The tricuspid valve is normal in structure. Tricuspid valve regurgitation is moderate . No evidence of tricuspid stenosis. Aortic Valve: The aortic valve is tricuspid. Aortic valve regurgitation is moderate. Aortic valve sclerosis is present, with no evidence of aortic valve stenosis. Aortic valve mean gradient measures 3.0 mmHg. Aortic valve peak gradient measures 4.9 mmHg.  Aortic valve area, by VTI measures 2.28 cm. Pulmonic Valve: The pulmonic valve was normal in structure. Pulmonic valve regurgitation is trivial. No evidence of pulmonic stenosis. Aorta: The aortic root and ascending aorta are structurally normal, with no evidence of dilitation. Venous: The inferior vena cava is normal in size with greater than 50% respiratory variability, suggesting right atrial pressure of 3 mmHg. IAS/Shunts: The atrial septum is grossly normal.  LEFT VENTRICLE PLAX 2D LVIDd:         4.30 cm     Diastology LVIDs:         3.00 cm     LV e' medial:    10.60 cm/s LV PW:         0.80 cm     LV E/e' medial:  10.0 LV IVS:        0.80 cm     LV e' lateral:   9.57 cm/s LVOT diam:     1.80 cm     LV E/e' lateral: 11.1 LV SV:         50 LV SV Index:   33 LVOT Area:     2.54 cm LV IVRT:       66 msec  LV Volumes (MOD) LV vol d, MOD A2C: 69.3 ml LV vol d, MOD A4C: 63.7 ml LV vol s, MOD A2C: 23.7 ml LV vol s,  MOD A4C: 26.5 ml LV SV MOD A2C:     45.6 ml LV SV MOD A4C:     63.7 ml LV SV MOD BP:      41.9 ml RIGHT VENTRICLE             IVC RV Basal diam:  2.80 cm     IVC diam: 1.80 cm RV FAC:         37.7 % RV S prime:     11.70 cm/s  PULMONARY VEINS TAPSE (M-mode): 1.4 cm      Diastolic Velocity: 46.60 cm/s                             S/D Velocity:       1.30                             Systolic Velocity:  60.30 cm/s LEFT ATRIUM             Index         RIGHT ATRIUM           Index LA diam:        3.30 cm 2.18 cm/m   RA Area:     11.90 cm LA Vol (A2C):   36.3 ml 23.94 ml/m  RA Volume:   29.20 ml  19.26 ml/m LA Vol (A4C):   25.7 ml 16.95 ml/m LA Biplane Vol: 31.8 ml 20.97 ml/m  AORTIC VALVE                    PULMONIC VALVE AV Area (Vmax):    2.27 cm     PV Vmax:       0.64 m/s AV Area (Vmean):   2.31 cm     PV Peak grad:  1.7 mmHg AV Area (VTI):     2.28 cm AV Vmax:           111.00 cm/s AV Vmean:          76.800 cm/s AV VTI:            0.220 m AV Peak Grad:      4.9 mmHg AV Mean Grad:      3.0 mmHg LVOT Vmax:         99.00 cm/s LVOT Vmean:        69.800 cm/s LVOT VTI:          0.197 m LVOT/AV VTI ratio: 0.90  AORTA Ao Root diam: 2.20 cm Ao Asc diam:  2.60 cm MITRAL VALVE                TRICUSPID VALVE MV Area (PHT): 3.13 cm     TR Peak grad:   22.5 mmHg MV Decel Time: 242 msec     TR Vmax:        237.00 cm/s MV E velocity: 106.00 cm/s MV A velocity: 71.30 cm/s   SHUNTS MV E/A ratio:  1.49         Systemic VTI:  0.20 m                             Systemic Diam: 1.80 cm Sunit Tolia Electronically signed by Madonna Large Signature Date/Time: 12/24/2023/4:42:46 PM    Final    DG Chest Port 1 View Result Date: 12/24/2023 EXAM: 1 VIEW(S) XRAY  OF THE CHEST 12/24/2023 09:13:00 AM COMPARISON: 12/23/2023 CLINICAL HISTORY: Hypoxia 200808. FINDINGS: LUNGS AND PLEURA: Increased bilateral moderate patchy airspace opacities. Increased diffuse interstitial prominence. New moderate to large left lateral pleural effusion. Small right pleural effusion. No pneumothorax. HEART AND MEDIASTINUM: No acute abnormality of the cardiac and mediastinal silhouettes. BONES AND SOFT TISSUES: No acute osseous abnormality. IMPRESSION: 1. Increased bilateral moderate patchy airspace opacities and diffuse interstitial prominence. 2. New moderate to large left pleural effusion and small right pleural effusion. Electronically signed by: Dayne Hassell MD 12/24/2023 03:43 PM EST RP  Workstation: [id number]   DG CHEST PORT 1 VIEW Result Date: 12/23/2023 EXAM: 1 VIEW(S) XRAY OF THE CHEST 12/23/2023 09:07:13 AM COMPARISON: 12/22/2023 CLINICAL HISTORY: 200808 Hypoxia 200808 FINDINGS: LUNGS AND PLEURA: No focal pulmonary opacity. Mild pulmonary edema. Diffuse interstitial prominence. Trace bilateral pleural effusions. No pneumothorax. HEART AND MEDIASTINUM: No acute abnormality of the cardiac and mediastinal silhouettes. BONES AND SOFT TISSUES: No acute osseous abnormality. IMPRESSION: 1. Mild pulmonary edema with diffuse interstitial prominence. 2. Small pleural effusions. Electronically signed by: Waddell Calk MD 12/23/2023 03:03 PM EST RP Workstation: [id number]   CT Renal Stone Study Result Date: 12/22/2023 CLINICAL DATA:  Nephrostomy catheter displacement. EXAM: CT ABDOMEN AND PELVIS WITHOUT CONTRAST TECHNIQUE: Multidetector CT imaging of the abdomen and pelvis was performed following the standard protocol without IV contrast. RADIATION DOSE REDUCTION: This exam was performed according to the departmental dose-optimization program which includes automated exposure control, adjustment of the mA and/or kV according to patient size and/or use of iterative reconstruction technique. COMPARISON:  CT abdomen pelvis dated 09/24/2023. FINDINGS: Evaluation of this exam is limited in the absence of intravenous contrast. Lower chest: No acute abnormality. No intra-abdominal free air.  Trace free fluid in the pelvis. Hepatobiliary: The liver is unremarkable. No biliary dilatation. Gallstone. No pericholecystic fluid or evidence of acute cholecystitis by CT. Pancreas: Unremarkable. No pancreatic ductal dilatation or surrounding inflammatory changes. Spleen: Normal in size without focal abnormality. Adrenals/Urinary Tract: The adrenal glands unremarkable. Status post prior right nephrectomy. The left native kidney is atrophic. Two right lower quadrant renal transplant noted. There has been interval  placement of a percutaneous nephrostomy with pigtail tip in the upper pole of the inferior transplant kidney with near resolution of the previously seen hydronephrosis. No stones or peritransplant fluid collection noted. There is however mild stranding adjacent to the transplant kidney and urinary bladder which may be related to recent procedure. Correlation with urinalysis recommended to evaluate for possibility of UTI. Stomach/Bowel: Evaluation of the bowel is limited in the absence of oral contrast. There is postsurgical changes and anastomotic staple line in the region of the rectum. There is sigmoid diverticulosis. Perisigmoid stranding in the pelvis likely extension from the urinary bladder. Diverticulitis is not excluded. Clinical correlation recommended. Multiple dilated small bowel loops measure up to 3.5 cm in caliber and likely representing ileus. Developing small bowel obstruction is not excluded. Small-bowel series may provide better evaluation if there is clinical concern for obstruction. The appendix is normal. Vascular/Lymphatic: Mild aortoiliac atherosclerotic disease. The IVC is unremarkable. No portal venous gas. There is no adenopathy. Reproductive: The uterus is grossly unremarkable. No suspicious adnexal masses. Other: None Musculoskeletal: Osteopenia with degenerative changes of the spine. No acute osseous pathology. IMPRESSION: 1. Interval placement of a percutaneous nephrostomy in the inferior right lower quadrant transplant kidney with near resolution of the previously seen hydronephrosis. No stones or peritransplant fluid collection. 2. Mild stranding adjacent to the transplant kidney and urinary bladder  may be related to recent procedure. Correlation with urinalysis recommended to evaluate for possibility of UTI. 3. Sigmoid diverticulosis. Perisigmoid stranding in the pelvis may be related to procedure or UTI. Diverticulitis is not excluded. 4. Small bowel ileus versus developing  obstruction. Small-bowel series may provide better evaluation if there is clinical concern for obstruction. 5. Cholelithiasis. 6.  Aortic Atherosclerosis (ICD10-I70.0). Electronically Signed   By: Vanetta Chou M.D.   On: 12/22/2023 19:45   CT Head Wo Contrast Result Date: 12/22/2023 EXAM: CT HEAD WITHOUT CONTRAST 12/22/2023 07:19:00 PM TECHNIQUE: CT of the head was performed without the administration of intravenous contrast. Automated exposure control, iterative reconstruction, and/or weight based adjustment of the mA/kV was utilized to reduce the radiation dose to as low as reasonably achievable. COMPARISON: None available. CLINICAL HISTORY: Mental status change, unknown cause. FINDINGS: BRAIN AND VENTRICLES: No acute hemorrhage. No evidence of acute infarct. No hydrocephalus. No extra-axial collection. No mass effect or midline shift. ORBITS: Bilateral lens replacement noted. SINUSES: Fluid in right mastoid air cells. No obstructing nasopharyngeal lesion is present. Mucosal thickening in bilateral maxillary and ethmoid sinuses. SOFT TISSUES AND SKULL: No acute soft tissue abnormality. No skull fracture. IMPRESSION: 1. No acute intracranial abnormality. 2. Right mastoid effusion and paranasal sinus inflammatory changes. No obstructing nasopharyngeal lesion identified. Electronically signed by: Lonni Necessary MD 12/22/2023 07:38 PM EST RP Workstation: [id number]   DG Chest Port 1 View Result Date: 12/22/2023 CLINICAL DATA:  History of bladder cancer with recent nephrostomy tube placement, presenting with altered mental status. EXAM: PORTABLE CHEST 1 VIEW COMPARISON:  September 24, 2023 FINDINGS: The heart size and mediastinal contours are within normal limits. No acute infiltrate, pleural effusion or pneumothorax is identified. The visualized skeletal structures are unremarkable. IMPRESSION: No active disease. Electronically Signed   By: Suzen Dials M.D.   On: 12/22/2023 19:03     Microbiology: Results for orders placed or performed during the hospital encounter of 12/22/23  Resp panel by RT-PCR (RSV, Flu A&B, Covid) Anterior Nasal Swab     Status: None   Collection Time: 12/22/23  6:13 PM   Specimen: Anterior Nasal Swab  Result Value Ref Range Status   SARS Coronavirus 2 by RT PCR NEGATIVE NEGATIVE Final   Influenza A by PCR NEGATIVE NEGATIVE Final   Influenza B by PCR NEGATIVE NEGATIVE Final    Comment: (NOTE) The Xpert Xpress SARS-CoV-2/FLU/RSV plus assay is intended as an aid in the diagnosis of influenza from Nasopharyngeal swab specimens and should not be used as a sole basis for treatment. Nasal washings and aspirates are unacceptable for Xpert Xpress SARS-CoV-2/FLU/RSV testing.  Fact Sheet for Patients: bloggercourse.com  Fact Sheet for Healthcare Providers: seriousbroker.it  This test is not yet approved or cleared by the [place]  FDA and has been authorized for detection and/or diagnosis of SARS-CoV-2 by FDA under an Emergency Use Authorization (EUA). This EUA will remain in effect (meaning this test can be used) for the duration of the COVID-19 declaration under Section 564(b)(1) of the Act, 21 U.S.C. section 360bbb-3(b)(1), unless the authorization is terminated or revoked.     Resp Syncytial Virus by PCR NEGATIVE NEGATIVE Final    Comment: (NOTE) Fact Sheet for Patients: bloggercourse.com  Fact Sheet for Healthcare Providers: seriousbroker.it  This test is not yet approved or cleared by the [place]  FDA and has been authorized for detection and/or diagnosis of SARS-CoV-2 by FDA under an Emergency Use Authorization (EUA). This EUA will remain in effect (meaning this test can  be used) for the duration of the COVID-19 declaration under Section 564(b)(1) of the Act, 21 U.S.C. section 360bbb-3(b)(1), unless the authorization is  terminated or revoked.  Performed at [facility], [address] [place], [place] [address]   Urine culture     Status: Abnormal   Collection Time: 12/22/23  6:14 PM   Specimen: Urine, Clean Catch  Result Value Ref Range Status   Specimen Description URINE, CLEAN CATCH  Final   Special Requests   Final    NONE Performed at [facility], [address], [place], [place] [address]    Culture   Final    Two isolates with different morphologies were identified as the same organism.The most resistant organism was reported. >=100,000 COLONIES/mL PSEUDOMONAS AERUGINOSA    Report Status 12/25/2023 FINAL  Final   Organism ID, Bacteria PSEUDOMONAS AERUGINOSA (A)  Final      Susceptibility   Pseudomonas aeruginosa - MIC*    MEROPENEM 1 SENSITIVE Sensitive     CIPROFLOXACIN >=4 RESISTANT Resistant     IMIPENEM 1 SENSITIVE Sensitive     PIP/TAZO Value in next row Sensitive      16 SENSITIVEThis is a modified FDA-approved test that has been validated and its performance characteristics determined by the reporting laboratory.  This laboratory is certified under the Clinical Laboratory Improvement Amendments CLIA as qualified to perform high complexity clinical laboratory testing.    CEFEPIME Value in next row Sensitive      16 SENSITIVEThis is a modified FDA-approved test that has been validated and its performance characteristics determined by the reporting laboratory.  This laboratory is certified under the Clinical Laboratory Improvement Amendments CLIA as qualified to perform high complexity clinical laboratory testing.    CEFTAZIDIME/AVIBACTAM Value in next row Sensitive      16 SENSITIVEThis is a modified FDA-approved test that has been validated and its performance characteristics determined by the reporting laboratory.  This laboratory is certified under the Clinical Laboratory Improvement Amendments CLIA as qualified to perform high complexity clinical laboratory testing.     CEFTOLOZANE/TAZOBACTAM Value in next row Sensitive      16 SENSITIVEThis is a modified FDA-approved test that has been validated and its performance characteristics determined by the reporting laboratory.  This laboratory is certified under the Clinical Laboratory Improvement Amendments CLIA as qualified to perform high complexity clinical laboratory testing.    TOBRAMYCIN Value in next row Sensitive      16 SENSITIVEThis is a modified FDA-approved test that has been validated and its performance characteristics determined by the reporting laboratory.  This laboratory is certified under the Clinical Laboratory Improvement Amendments CLIA as qualified to perform high complexity clinical laboratory testing.    CEFTAZIDIME Value in next row Sensitive      16 SENSITIVEThis is a modified FDA-approved test that has been validated and its performance characteristics determined by the reporting laboratory.  This laboratory is certified under the Clinical Laboratory Improvement Amendments CLIA as qualified to perform high complexity clinical laboratory testing.    * >=100,000 COLONIES/mL PSEUDOMONAS AERUGINOSA  Blood Culture (routine x 2)     Status: Abnormal   Collection Time: 12/22/23  6:15 PM   Specimen: BLOOD LEFT FOREARM  Result Value Ref Range Status   Specimen Description BLOOD LEFT FOREARM  Final   Special Requests   Final    BOTTLES DRAWN AEROBIC AND ANAEROBIC Blood Culture adequate volume   Culture  Setup Time   Final  GRAM NEGATIVE RODS AEROBIC BOTTLE ONLY CRITICAL RESULT CALLED TO, READ BACK BY AND VERIFIED WITH: MAYA CHARLENA SLADE [phone] @ [phone] Performed at [facility], [address] [place], [place] [address]    Culture PSEUDOMONAS AERUGINOSA (A)  Final   Report Status 12/25/2023 FINAL  Final   Organism ID, Bacteria PSEUDOMONAS AERUGINOSA  Final      Susceptibility   Pseudomonas aeruginosa - MIC*    MEROPENEM <=0.25 SENSITIVE Sensitive     CIPROFLOXACIN >=4 RESISTANT  Resistant     IMIPENEM 1 SENSITIVE Sensitive     PIP/TAZO Value in next row Sensitive      <=4 SENSITIVEThis is a modified FDA-approved test that has been validated and its performance characteristics determined by the reporting laboratory.  This laboratory is certified under the Clinical Laboratory Improvement Amendments CLIA as qualified to perform high complexity clinical laboratory testing.    CEFTAZIDIME/AVIBACTAM Value in next row Sensitive      <=4 SENSITIVEThis is a modified FDA-approved test that has been validated and its performance characteristics determined by the reporting laboratory.  This laboratory is certified under the Clinical Laboratory Improvement Amendments CLIA as qualified to perform high complexity clinical laboratory testing.    CEFTOLOZANE/TAZOBACTAM Value in next row Sensitive      <=4 SENSITIVEThis is a modified FDA-approved test that has been validated and its performance characteristics determined by the reporting laboratory.  This laboratory is certified under the Clinical Laboratory Improvement Amendments CLIA as qualified to perform high complexity clinical laboratory testing.    TOBRAMYCIN Value in next row Sensitive      <=4 SENSITIVEThis is a modified FDA-approved test that has been validated and its performance characteristics determined by the reporting laboratory.  This laboratory is certified under the Clinical Laboratory Improvement Amendments CLIA as qualified to perform high complexity clinical laboratory testing.    CEFTAZIDIME Value in next row Sensitive      <=4 SENSITIVEThis is a modified FDA-approved test that has been validated and its performance characteristics determined by the reporting laboratory.  This laboratory is certified under the Clinical Laboratory Improvement Amendments CLIA as qualified to perform high complexity clinical laboratory testing.    * PSEUDOMONAS AERUGINOSA  Blood Culture ID Panel (Reflexed)     Status: Abnormal   Collection  Time: 12/22/23  6:15 PM  Result Value Ref Range Status   Enterococcus faecalis NOT DETECTED NOT DETECTED Final   Enterococcus Faecium NOT DETECTED NOT DETECTED Final   Listeria monocytogenes NOT DETECTED NOT DETECTED Final   Staphylococcus species NOT DETECTED NOT DETECTED Final   Staphylococcus aureus (BCID) NOT DETECTED NOT DETECTED Final   Staphylococcus epidermidis NOT DETECTED NOT DETECTED Final   Staphylococcus lugdunensis NOT DETECTED NOT DETECTED Final   Streptococcus species NOT DETECTED NOT DETECTED Final   Streptococcus agalactiae NOT DETECTED NOT DETECTED Final   Streptococcus pneumoniae NOT DETECTED NOT DETECTED Final   Streptococcus pyogenes NOT DETECTED NOT DETECTED Final   A.calcoaceticus-baumannii NOT DETECTED NOT DETECTED Final   Bacteroides fragilis NOT DETECTED NOT DETECTED Final   Enterobacterales NOT DETECTED NOT DETECTED Final   Enterobacter cloacae complex NOT DETECTED NOT DETECTED Final   Escherichia coli NOT DETECTED NOT DETECTED Final   Klebsiella aerogenes NOT DETECTED NOT DETECTED Final   Klebsiella oxytoca NOT DETECTED NOT DETECTED Final   Klebsiella pneumoniae NOT DETECTED NOT DETECTED Final   Proteus species NOT DETECTED NOT DETECTED Final   Salmonella species NOT DETECTED NOT DETECTED Final   Serratia marcescens  NOT DETECTED NOT DETECTED Final   Haemophilus influenzae NOT DETECTED NOT DETECTED Final   Neisseria meningitidis NOT DETECTED NOT DETECTED Final   Pseudomonas aeruginosa DETECTED (A) NOT DETECTED Final    Comment: CRITICAL RESULT CALLED TO, READ BACK BY AND VERIFIED WITH: PHARMD ESABRA SLADE [id number] @ 1841 FH    Stenotrophomonas maltophilia NOT DETECTED NOT DETECTED Final   Candida albicans NOT DETECTED NOT DETECTED Final   Candida auris NOT DETECTED NOT DETECTED Final   Candida glabrata NOT DETECTED NOT DETECTED Final   Candida krusei NOT DETECTED NOT DETECTED Final   Candida parapsilosis NOT DETECTED NOT DETECTED Final   Candida  tropicalis NOT DETECTED NOT DETECTED Final   Cryptococcus neoformans/gattii NOT DETECTED NOT DETECTED Final   CTX-M ESBL NOT DETECTED NOT DETECTED Final   Carbapenem resistance IMP NOT DETECTED NOT DETECTED Final   Carbapenem resistance KPC NOT DETECTED NOT DETECTED Final   Carbapenem resistance NDM NOT DETECTED NOT DETECTED Final   Carbapenem resistance VIM NOT DETECTED NOT DETECTED Final    Comment: Performed at [facility], [address] [place], [place] [address]  Blood Culture (routine x 2)     Status: Abnormal   Collection Time: 12/22/23  6:20 PM   Specimen: BLOOD  Result Value Ref Range Status   Specimen Description BLOOD SITE NOT SPECIFIED  Final   Special Requests   Final    BOTTLES DRAWN AEROBIC AND ANAEROBIC Blood Culture adequate volume   Culture  Setup Time   Final    GRAM NEGATIVE RODS AEROBIC BOTTLE ONLY CRITICAL VALUE NOTED.  VALUE IS CONSISTENT WITH PREVIOUSLY REPORTED AND CALLED VALUE.    Culture (A)  Final    PSEUDOMONAS AERUGINOSA SUSCEPTIBILITIES PERFORMED ON PREVIOUS CULTURE WITHIN THE LAST 5 DAYS. Performed at [facility], [address], [place], [place] [address]    Report Status 12/25/2023 FINAL  Final  Culture, blood (Routine X 2) w Reflex to ID Panel     Status: None   Collection Time: 12/27/23  4:00 PM   Specimen: BLOOD  Result Value Ref Range Status   Specimen Description BLOOD RIGHT ANTECUBITAL  Final   Special Requests   Final    BOTTLES DRAWN AEROBIC ONLY Blood Culture results may not be optimal due to an inadequate volume of blood received in culture bottles   Culture   Final    NO GROWTH 5 DAYS Performed at [facility], [address], [place], [place] [address]    Report Status 01/01/2024 FINAL  Final  Culture, blood (Routine X 2) w Reflex to ID Panel     Status: None   Collection Time: 12/27/23  4:04 PM   Specimen: BLOOD RIGHT HAND  Result Value Ref Range Status   Specimen Description BLOOD RIGHT HAND  Final    Special Requests   Final    BOTTLES DRAWN AEROBIC ONLY Blood Culture adequate volume   Culture   Final    NO GROWTH 5 DAYS Performed at [facility], [address], [place], [place] [address]    Report Status 01/01/2024 FINAL  Final    Labs: CBC: Recent Labs  Lab 12/29/23 0343 12/30/23 0308 12/31/23 0222 01/02/24 0227 01/03/24 0247  WBC 15.4* 17.0* 13.5* 10.5 10.6*  HGB 11.9* 11.1* 9.3* 8.5* 8.2*  HCT 37.4 34.4* 29.2* 26.3* 26.3*  MCV 97.7 96.1 96.1 96.7 98.1  PLT 91* 127* 144* 225 312   Basic Metabolic Panel: Recent Labs  Lab 12/29/23 0343 12/30/23 0308 12/31/23 0222 01/01/24 0236 01/03/24 0247  NA 146* 136 136 137 140  K 3.8 4.3 4.4 4.3 4.2  CL 102 98 98 102 111  CO2 31 28 25 22  21*  GLUCOSE 164* 101* 129* 88 49*  BUN 63* 62* 70* 60* 41*  CREATININE 1.60* 1.51* 1.91* 2.06* 1.55*  CALCIUM 8.6* 7.9* 8.0* 8.3* 8.4*  MG 1.6*  --   --   --   --   PHOS 2.0* 4.1  --   --   --    Liver Function Tests: Recent Labs  Lab 12/29/23 0343 12/30/23 0308 12/31/23 0222  AST  --   --  22  ALT  --   --  49*  ALKPHOS  --   --  75  BILITOT  --   --  0.5  PROT  --   --  5.4*  ALBUMIN 2.4* 2.3* 2.2*   CBG: Recent Labs  Lab 01/02/24 1542 01/02/24 2012 01/03/24 0021 01/03/24 0433 01/03/24 0729  GLUCAP 219* 126* 135* 84 105*    Discharge time spent: greater than 30 minutes.  Signed: Elidia Toribio Furnace, MD [facility] 01/04/2024

## 2024-01-04 NOTE — TOC Transition Note (Signed)
 Transition of Care [facility]) - Discharge Note   Patient Details  Name: Rhonda [name] MRN: [id number] Date of Birth: 12/06/1955  Transition of Care [facility]) CM/SW Contact:  Waddell Barnie Rama, RN Phone Number: 01/04/2024, 12:25 PM   Clinical Narrative:    For dc today, spouse will transport her home.         Patient Goals and CMS Choice            Discharge Placement                       Discharge Plan and Services Additional resources added to the After Visit Summary for                                       Social Drivers of Health (SDOH) Interventions SDOH Screenings   Food Insecurity: No Food Insecurity (12/24/2023)  Housing: Low Risk  (12/24/2023)  Transportation Needs: No Transportation Needs (12/24/2023)  Utilities: Not At Risk (12/24/2023)  Alcohol Screen: Low Risk  (11/29/2023)  Depression (PHQ2-9): Low Risk  (12/21/2023)  Financial Resource Strain: Low Risk  (11/29/2023)  Physical Activity: Sufficiently Active (11/29/2023)  Social Connections: Moderately Integrated (12/24/2023)  Stress: Stress Concern Present (11/29/2023)  Tobacco Use: Medium Risk (12/24/2023)     Readmission Risk Interventions    01/04/2024   12:23 PM  Readmission Risk Prevention Plan  Transportation Screening Complete  HRI or Home Care Consult Complete  Social Work Consult for Recovery Care Planning/Counseling Complete  Palliative Care Screening Not Applicable  Medication Review [employer]) Complete

## 2024-01-04 NOTE — TOC CM/SW Note (Signed)
 Transition of Care [facility]) - Inpatient Brief Assessment   Patient Details  Name: Laycee Fitzsimmons MRN: [id number] Date of Birth: 10/09/1955  Transition of Care [facility]) CM/SW Contact:    Waddell Barnie Rama, RN Phone Number: 01/04/2024, 12:23 PM   Clinical Narrative: From home with spouse, has PCP and insurance on file, states has no HH services in place at this time or DME at home.  States family member will transport them home at [employer] and family is support system, .  Pta self ambulatory.   Per PT eval rec HHPT and HHOT, patient states she does not want HHOT, she just wants HHPT.  She has no preference of the agency. NCM sent referral to [facility] they accepted.  Soc will begin 24 to 48 hrs post dc.    Transition of Care Asessment: Insurance and Status: Insurance coverage has been reviewed Patient has primary care physician: Yes Home environment has been reviewed: home with spouse Prior level of function:: ambulatory Prior/Current Home Services: No current home services Social Drivers of Health Review: SDOH reviewed no interventions necessary Readmission risk has been reviewed: Yes Transition of care needs: transition of care needs identified, TOC will continue to follow

## 2024-01-04 NOTE — Telephone Encounter (Signed)
 Please see message.

## 2024-01-04 NOTE — Progress Notes (Signed)
 Physical Therapy Treatment Patient Details Name: Rhonda [name] MRN: [id number] DOB: [birth year] Today's Date: 01/04/2024   History of Present Illness 68 yo F adm 12/22/23 with ALOC, UTI, septic shock, nonconvulsive status epilepticus. PMH:  DM, bladder CA, HTN, HLD, s/p Kidney transplant    PT Comments  Pt pleasant, joking and daughter present to witness mobility. Pt with increased tolerance for stairs and HEP with education for walking and repeated sit to stands to maximize function. Pt remains appropriate for HHPT with family assist.     If plan is discharge home, recommend the following: Assistance with cooking/housework;Direct supervision/assist for medications management;Direct supervision/assist for financial management;Assist for transportation;Help with stairs or ramp for entrance;Supervision due to cognitive status;A little help with walking and/or transfers;A little help with bathing/dressing/bathroom   Can travel by private vehicle        Equipment Recommendations  Rolling walker (2 wheels);BSC/3in1    Recommendations for Other Services       Precautions / Restrictions Precautions Precautions: Fall Recall of Precautions/Restrictions: Intact Precaution/Restrictions Comments: nephrostomy tube     Mobility  Bed Mobility Overal bed mobility: Modified Independent Bed Mobility: Supine to Sit           General bed mobility comments: HOB 10 degrees, no physical assist    Transfers Overall transfer level: Needs assistance   Transfers: Sit to/from Stand Sit to Stand: Contact guard assist           General transfer comment: CGA for safety and cues to control descent. pt stood from bed, recliner and toilet. 10 repeated sit to stands end of session without UB support    Ambulation/Gait Ambulation/Gait assistance: Contact guard assist Gait Distance (Feet): 160 Feet Assistive device: None Gait Pattern/deviations: Step-through pattern   Gait velocity  interpretation: 1.31 - 2.62 ft/sec, indicative of limited community ambulator   General Gait Details: slightly unsteadiness with CGA to maintain balance. pt walked 160' x 2 with seated rest   Stairs Stairs: Yes Stairs assistance: Contact guard assist Stair Management: Alternating pattern, Forwards, One rail Right Number of Stairs: 11 General stair comments: guarding for safety with reliance of rail   Wheelchair Mobility     Tilt Bed    Modified Rankin (Stroke Patients Only)       Balance Overall balance assessment: Needs assistance Sitting-balance support: No upper extremity supported, Feet supported Sitting balance-Leahy Scale: Good     Standing balance support: No upper extremity supported, During functional activity Standing balance-Leahy Scale: Fair                              Hotel Manager: No apparent difficulties  Cognition Arousal: Alert Behavior During Therapy: WFL for tasks assessed/performed   PT - Cognitive impairments: Memory                       PT - Cognition Comments: pt conversational, pleasant, oriented with some deficits in memory but recalling and locatin room number, following commands well Following commands: Intact Following commands impaired: Follows one step commands with increased time, Follows one step commands inconsistently    Cueing Cueing Techniques: Verbal cues  Exercises General Exercises - Lower Extremity Long Arc Quad: Strengthening, Both, Seated, AROM, 20 reps Straight Leg Raises: AROM, Both, 10 reps, Standing, Strengthening    General Comments        Pertinent Vitals/Pain Pain Assessment Pain Assessment: No/denies pain    Home Living  Prior Function            PT Goals (current goals can now be found in the care plan section) Progress towards PT goals: Progressing toward goals    Frequency    Min 2X/week      PT Plan       Co-evaluation              AM-PAC PT 6 Clicks Mobility   Outcome Measure  Help needed turning from your back to your side while in a flat bed without using bedrails?: None Help needed moving from lying on your back to sitting on the side of a flat bed without using bedrails?: None Help needed moving to and from a bed to a chair (including a wheelchair)?: A Little Help needed standing up from a chair using your arms (e.g., wheelchair or bedside chair)?: A Little Help needed to walk in hospital room?: A Little Help needed climbing 3-5 steps with a railing? : A Little 6 Click Score: 20    End of Session Equipment Utilized During Treatment: Gait belt Activity Tolerance: Patient tolerated treatment well Patient left: in chair;with chair alarm set;with call bell/phone within reach;with family/visitor present Nurse Communication: Mobility status PT Visit Diagnosis: Other abnormalities of gait and mobility (R26.89);Muscle weakness (generalized) (M62.81);Difficulty in walking, not elsewhere classified (R26.2);Other symptoms and signs involving the nervous system (R29.898)     Time: 9158-9092 PT Time Calculation (min) (ACUTE ONLY): 26 min  Charges:    $Gait Training: 8-22 mins $Therapeutic Exercise: 8-22 mins PT General Charges $$ ACUTE PT VISIT: 1 Visit                     [name], PT Acute Rehabilitation Services Office: [phone]    Rhonda [name] 01/04/2024, 10:00 AM

## 2024-01-04 NOTE — Plan of Care (Signed)

## 2024-01-11 ENCOUNTER — Other Ambulatory Visit (HOSPITAL_COMMUNITY): Payer: Self-pay

## 2024-01-26 ENCOUNTER — Inpatient Hospital Stay: Attending: Oncology | Admitting: Oncology

## 2024-01-26 ENCOUNTER — Ambulatory Visit: Payer: Self-pay | Admitting: Oncology

## 2024-01-26 ENCOUNTER — Inpatient Hospital Stay

## 2024-01-26 VITALS — BP 140/94 | HR 100 | Temp 98.0°F | Resp 18 | Ht 63.0 in | Wt 97.9 lb

## 2024-01-26 DIAGNOSIS — Z87891 Personal history of nicotine dependence: Secondary | ICD-10-CM | POA: Insufficient documentation

## 2024-01-26 DIAGNOSIS — I10 Essential (primary) hypertension: Secondary | ICD-10-CM | POA: Diagnosis not present

## 2024-01-26 DIAGNOSIS — C679 Malignant neoplasm of bladder, unspecified: Secondary | ICD-10-CM | POA: Diagnosis not present

## 2024-01-26 DIAGNOSIS — Z8673 Personal history of transient ischemic attack (TIA), and cerebral infarction without residual deficits: Secondary | ICD-10-CM | POA: Diagnosis not present

## 2024-01-26 DIAGNOSIS — M810 Age-related osteoporosis without current pathological fracture: Secondary | ICD-10-CM | POA: Insufficient documentation

## 2024-01-26 DIAGNOSIS — C675 Malignant neoplasm of bladder neck: Secondary | ICD-10-CM | POA: Insufficient documentation

## 2024-01-26 DIAGNOSIS — N1330 Unspecified hydronephrosis: Secondary | ICD-10-CM | POA: Insufficient documentation

## 2024-01-26 LAB — CBC WITH DIFFERENTIAL (CANCER CENTER ONLY)
Abs Immature Granulocytes: 0.11 K/uL — ABNORMAL HIGH (ref 0.00–0.07)
Basophils Absolute: 0 K/uL (ref 0.0–0.1)
Basophils Relative: 0 %
Eosinophils Absolute: 0 K/uL (ref 0.0–0.5)
Eosinophils Relative: 0 %
HCT: 27 % — ABNORMAL LOW (ref 36.0–46.0)
Hemoglobin: 8.8 g/dL — ABNORMAL LOW (ref 12.0–15.0)
Immature Granulocytes: 1 %
Lymphocytes Relative: 11 %
Lymphs Abs: 1.6 K/uL (ref 0.7–4.0)
MCH: 30.6 pg (ref 26.0–34.0)
MCHC: 32.6 g/dL (ref 30.0–36.0)
MCV: 93.8 fL (ref 80.0–100.0)
Monocytes Absolute: 1.5 K/uL — ABNORMAL HIGH (ref 0.1–1.0)
Monocytes Relative: 10 %
Neutro Abs: 11.7 K/uL — ABNORMAL HIGH (ref 1.7–7.7)
Neutrophils Relative %: 78 %
Platelet Count: 439 K/uL — ABNORMAL HIGH (ref 150–400)
RBC: 2.88 MIL/uL — ABNORMAL LOW (ref 3.87–5.11)
RDW: 14.2 % (ref 11.5–15.5)
WBC Count: 14.9 K/uL — ABNORMAL HIGH (ref 4.0–10.5)
nRBC: 0 % (ref 0.0–0.2)

## 2024-01-26 LAB — CMP (CANCER CENTER ONLY)
ALT: 14 U/L (ref 0–44)
AST: 19 U/L (ref 15–41)
Albumin: 3.6 g/dL (ref 3.5–5.0)
Alkaline Phosphatase: 120 U/L (ref 38–126)
Anion gap: 15 (ref 5–15)
BUN: 41 mg/dL — ABNORMAL HIGH (ref 8–23)
CO2: 18 mmol/L — ABNORMAL LOW (ref 22–32)
Calcium: 10.3 mg/dL (ref 8.9–10.3)
Chloride: 103 mmol/L (ref 98–111)
Creatinine: 1.5 mg/dL — ABNORMAL HIGH (ref 0.44–1.00)
GFR, Estimated: 38 mL/min — ABNORMAL LOW (ref >60)
Glucose, Bld: 110 mg/dL — ABNORMAL HIGH (ref 70–99)
Potassium: 4.6 mmol/L (ref 3.5–5.1)
Sodium: 136 mmol/L (ref 135–145)
Total Bilirubin: 0.3 mg/dL (ref 0.0–1.2)
Total Protein: 7.6 g/dL (ref 6.5–8.1)

## 2024-01-26 NOTE — Progress Notes (Signed)
 [facility] OFFICE PROGRESS NOTE   Diagnosis: Bladder cancer  INTERVAL HISTORY:   Rhonda [name] was admitted with urosepsis 12/22/2023.  She had a prolonged hospitalization and was discharged to home 01/04/2024.  No further fever.  She has nausea and intermittent lower abdominal pain.  She would like to proceed with bladder surgery as soon as possible.  She is scheduled to see Dr. Evern next week. She has not seen her primary provider or nephrologist since discharge from the hospital. Objective:  Vital signs in last 24 hours:  Blood pressure (!) 140/94, pulse 100, temperature 98 F (36.7 C), temperature source Temporal, resp. rate 18, height 5' 3 (1.6 m), weight 97 lb 14.4 oz (44.4 kg), SpO2 100%.   Lymphatics: No cervical, supraclavicular, axillary, or inguinal nodes Resp: Lungs clear bilaterally Cardio: Regular rate and rhythm GI: No hepatosplenomegaly, right lower abdomen nephrostomy site with a gauze dressing, nontender Vascular: No leg edema Neuro: Alert and oriented  Lab Results:  Lab Results  Component Value Date   WBC 10.6 (H) 01/03/2024   HGB 8.2 (L) 01/03/2024   HCT 26.3 (L) 01/03/2024   MCV 98.1 01/03/2024   PLT 312 01/03/2024   NEUTROABS 13.4 (H) 12/22/2023    CMP  Lab Results  Component Value Date   NA 140 01/03/2024   K 4.2 01/03/2024   CL 111 01/03/2024   CO2 21 (L) 01/03/2024   GLUCOSE 49 (L) 01/03/2024   BUN 41 (H) 01/03/2024   CREATININE 1.55 (H) 01/03/2024   CALCIUM 8.4 (L) 01/03/2024   PROT 5.4 (L) 12/31/2023   ALBUMIN 2.2 (L) 12/31/2023   AST 22 12/31/2023   ALT 49 (H) 12/31/2023   ALKPHOS 75 12/31/2023   BILITOT 0.5 12/31/2023   GFRNONAA 36 (L) 01/03/2024    No results found for: CEA1, CEA, CAN199, CA125  Lab Results  Component Value Date   INR 1.2 12/27/2023   LABPROT 16.0 (H) 12/27/2023    Imaging:  No results found.  Medications: I have reviewed the patient's current  medications.   Assessment/Plan:  Muscle-invasive bladder cancer 09/01/2023 cystoscopy: 2 cm nodular tumor at the right bladder neck extending to the right lateral wall and right posterior wall, possible 5 mm right dome satellite lesion 09/01/2023: CT abdomen/pelvis-indeterminate 2 cm area of soft tissue nodularity at the central base of the urinary bladder 09/21/2023: TURBT-muscle-invasive bladder cancer, high-grade involving bladder neck and right posterior wall 09/29/2023 CTs: Suspicious heterogenous enhancement of the bladder wall and nodular appearance of the left bladder base, indeterminate right breast nodule, History of macrocytic anemia Hypertension Osteoporosis Hyperlipidemia Admission with E. 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  2020 recurrence of bladder abnormality at the trigone, resected-nephrogenic adenoma 13.  Indeterminate right breast nodule on CT 09/29/2023 14.  12/22/2023 admission with Pseudomonas urosepsis applicator by respiratory failure, metabolic encephalopathy with a seizure, and a punctate subacute CVA, antiepileptics discontinued prior to discharge from the hospital, placed on aspirin      Disposition: Rhonda [name] has  a history of locally recurrent bladder cancer in the setting of a renal transplant and chronic  immunosuppressive therapy.  She was admitted last month with urosepsis.  She appears to have recovered.  She was scheduled to have a cystectomy at [facility] last month, but was hospitalized.  She would like to proceed with surgery as soon as possible.  She requests a second opinion urology evaluation at [facility].  We will try to arrange for this as soon as possible.  She will return to the lab for a CBC and chemistry panel today.  Rhonda [name] will return for an office visit here in 4 weeks.  We are available to see her sooner as needed.  She will seek medical attention for a fever or confusion.  Arley Hof, MD  01/26/2024  8:42 AM

## 2024-01-27 NOTE — Telephone Encounter (Signed)
 Notified Rhonda [name] that renal functions are improved; WBC is high which could be related to the prednisone  and recent infection; Hgb is lower, which could be related to her surgery. He would like to check her labs w/OV on 1/14 (scheduler notified to add an OV to 1/14 via chat). Also needs repeat CBC/CMP before her next surgery. She reports she is seeing another provider for 2nd opinion and agrees to call office when surgery is scheduled to have repeat labs done.

## 2024-01-31 ENCOUNTER — Inpatient Hospital Stay: Admitting: Oncology

## 2024-02-01 ENCOUNTER — Inpatient Hospital Stay: Admitting: Nurse Practitioner

## 2024-02-04 ENCOUNTER — Telehealth: Payer: Self-pay | Admitting: Nurse Practitioner

## 2024-02-04 ENCOUNTER — Telehealth: Payer: Self-pay | Admitting: *Deleted

## 2024-02-04 NOTE — Telephone Encounter (Signed)
 Called Pt to update on rescheduled appt, voicemail not set up

## 2024-02-04 NOTE — Telephone Encounter (Signed)
 Left VM reporting her surgery is on 03/01/24. Asking to move her lab/OV of 03/01/24 to February. Scheduler notified.

## 2024-02-16 ENCOUNTER — Inpatient Hospital Stay: Admitting: Oncology

## 2024-03-01 ENCOUNTER — Inpatient Hospital Stay

## 2024-03-01 ENCOUNTER — Inpatient Hospital Stay: Admitting: Nurse Practitioner

## 2024-03-10 ENCOUNTER — Ambulatory Visit: Payer: Self-pay

## 2024-03-10 NOTE — Telephone Encounter (Signed)
 FYI Only or Action Required?: Action required by provider: update on patient condition.  Patient was last seen in primary care on new patient.  Called Nurse Triage reporting Advice Only.  Symptoms began no symptoms.  Interventions attempted: Nothing.  Symptoms are: stable.  Triage Disposition: Information or Advice Only Call  Patient/caregiver understands and will follow disposition?: Yes  Copied from CRM [id number]. Topic: Clinical - Medical Advice >> Mar 10, 2024 11:48 AM Adelita E wrote: Reason for CRM: Patient thinks her husband has the flu and she is wanting a prescription of Tamiflu. Patient is getting established with Philippe Slade 1/26, but she would like this called in before the weekend if possible. Reason for Disposition  Health information question, no triage required and triager able to answer question  Answer Assessment - Initial Assessment Questions 1. REASON FOR CALL: What is the main reason for your call? or How can I best help you?     Husband is sick and is concerned about her risk of infection due to recent hospitalization. Patient would like prescription for tamiflu 2. SYMPTOMS : Do you have any symptoms?  Patient is asymptomatic 3. OTHER QUESTIONS: Do you have any other questions?     None Advised that No medication will be called in without being seen. She verbalizes understanding.  Advised hand washing and limiting contact with husband while he is symptomatic. Verbalizes understanding  Protocols used: Information Only Call - No Triage-A-AH

## 2024-03-13 ENCOUNTER — Ambulatory Visit: Admitting: Family Medicine

## 2024-03-14 ENCOUNTER — Telehealth: Payer: Self-pay | Admitting: Oncology

## 2024-03-14 NOTE — Telephone Encounter (Signed)
 PT called to reschedule appt due to the weather. Day and time confirmed.

## 2024-03-20 ENCOUNTER — Inpatient Hospital Stay: Admitting: Oncology

## 2024-03-20 ENCOUNTER — Inpatient Hospital Stay: Admitting: Nurse Practitioner

## 2024-03-20 ENCOUNTER — Inpatient Hospital Stay

## 2024-03-24 ENCOUNTER — Ambulatory Visit

## 2024-04-04 ENCOUNTER — Ambulatory Visit: Payer: Medicare Other | Admitting: Internal Medicine

## 2024-04-26 ENCOUNTER — Inpatient Hospital Stay: Admitting: Oncology

## 2024-04-26 ENCOUNTER — Inpatient Hospital Stay
